# Patient Record
Sex: Male | Born: 1950
Health system: Southern US, Community
[De-identification: ages and names within clinical notes are randomized; demographics above are authoritative.]

## PROBLEM LIST (undated history)

## (undated) DIAGNOSIS — I639 Cerebral infarction, unspecified: Secondary | ICD-10-CM

## (undated) DIAGNOSIS — F419 Anxiety disorder, unspecified: Secondary | ICD-10-CM

## (undated) DIAGNOSIS — T7840XA Allergy, unspecified, initial encounter: Secondary | ICD-10-CM

## (undated) DIAGNOSIS — E785 Hyperlipidemia, unspecified: Secondary | ICD-10-CM

## (undated) DIAGNOSIS — G4730 Sleep apnea, unspecified: Secondary | ICD-10-CM

## (undated) DIAGNOSIS — K5730 Diverticulosis of large intestine without perforation or abscess without bleeding: Secondary | ICD-10-CM

## (undated) DIAGNOSIS — G4733 Obstructive sleep apnea (adult) (pediatric): Secondary | ICD-10-CM

## (undated) DIAGNOSIS — N200 Calculus of kidney: Secondary | ICD-10-CM

## (undated) DIAGNOSIS — J309 Allergic rhinitis, unspecified: Secondary | ICD-10-CM

## (undated) DIAGNOSIS — I10 Essential (primary) hypertension: Secondary | ICD-10-CM

## (undated) DIAGNOSIS — G459 Transient cerebral ischemic attack, unspecified: Secondary | ICD-10-CM

## (undated) DIAGNOSIS — E119 Type 2 diabetes mellitus without complications: Secondary | ICD-10-CM

## (undated) HISTORY — PX: COLONOSCOPY: SHX174

## (undated) HISTORY — DX: Type 2 diabetes mellitus without complications: E11.9

## (undated) HISTORY — PX: COSMETIC SURGERY: SHX468

## (undated) HISTORY — PX: TONSILLECTOMY: SUR1361

## (undated) HISTORY — DX: Anxiety disorder, unspecified: F41.9

## (undated) HISTORY — DX: Cerebral infarction, unspecified: I63.9

## (undated) HISTORY — DX: Obstructive sleep apnea (adult) (pediatric): G47.33

## (undated) HISTORY — DX: Essential (primary) hypertension: I10

## (undated) HISTORY — DX: Diverticulosis of large intestine without perforation or abscess without bleeding: K57.30

## (undated) HISTORY — DX: Calculus of kidney: N20.0

## (undated) HISTORY — DX: Sleep apnea, unspecified: G47.30

## (undated) HISTORY — DX: Hyperlipidemia, unspecified: E78.5

## (undated) HISTORY — DX: Transient cerebral ischemic attack, unspecified: G45.9

## (undated) HISTORY — DX: Allergy, unspecified, initial encounter: T78.40XA

## (undated) HISTORY — DX: Allergic rhinitis, unspecified: J30.9

---
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
## 1998-04-13 ENCOUNTER — Observation Stay (HOSPITAL_COMMUNITY): Admission: EM | Admit: 1998-04-13 | Discharge: 1998-04-14 | Payer: Self-pay | Admitting: Cardiology

## 1998-04-22 ENCOUNTER — Emergency Department (HOSPITAL_COMMUNITY): Admission: EM | Admit: 1998-04-22 | Discharge: 1998-04-22 | Payer: Self-pay | Admitting: Emergency Medicine

## 1998-06-30 ENCOUNTER — Encounter: Payer: Self-pay | Admitting: Pulmonary Disease

## 1998-06-30 ENCOUNTER — Ambulatory Visit: Admission: RE | Admit: 1998-06-30 | Discharge: 1998-06-30 | Payer: Self-pay | Admitting: Internal Medicine

## 1998-08-28 ENCOUNTER — Encounter: Payer: Self-pay | Admitting: Pulmonary Disease

## 2004-09-15 ENCOUNTER — Emergency Department (HOSPITAL_COMMUNITY): Admission: EM | Admit: 2004-09-15 | Discharge: 2004-09-15 | Payer: Self-pay | Admitting: Emergency Medicine

## 2006-06-11 ENCOUNTER — Ambulatory Visit: Payer: Self-pay | Admitting: Internal Medicine

## 2006-07-17 ENCOUNTER — Ambulatory Visit: Payer: Self-pay | Admitting: Gastroenterology

## 2006-07-31 ENCOUNTER — Ambulatory Visit: Payer: Self-pay | Admitting: Gastroenterology

## 2006-07-31 ENCOUNTER — Encounter: Payer: Self-pay | Admitting: Gastroenterology

## 2007-07-23 ENCOUNTER — Emergency Department (HOSPITAL_COMMUNITY): Admission: EM | Admit: 2007-07-23 | Discharge: 2007-07-23 | Payer: Self-pay | Admitting: Emergency Medicine

## 2008-02-28 ENCOUNTER — Encounter: Payer: Self-pay | Admitting: Endocrinology

## 2008-06-22 ENCOUNTER — Telehealth (INDEPENDENT_AMBULATORY_CARE_PROVIDER_SITE_OTHER): Payer: Self-pay | Admitting: *Deleted

## 2008-06-26 ENCOUNTER — Ambulatory Visit: Payer: Self-pay | Admitting: Internal Medicine

## 2008-06-26 DIAGNOSIS — E119 Type 2 diabetes mellitus without complications: Secondary | ICD-10-CM

## 2008-06-26 DIAGNOSIS — G4733 Obstructive sleep apnea (adult) (pediatric): Secondary | ICD-10-CM

## 2008-06-26 HISTORY — DX: Type 2 diabetes mellitus without complications: E11.9

## 2008-06-26 HISTORY — DX: Obstructive sleep apnea (adult) (pediatric): G47.33

## 2008-06-27 DIAGNOSIS — I10 Essential (primary) hypertension: Secondary | ICD-10-CM | POA: Insufficient documentation

## 2008-06-27 DIAGNOSIS — E785 Hyperlipidemia, unspecified: Secondary | ICD-10-CM

## 2008-06-27 HISTORY — DX: Essential (primary) hypertension: I10

## 2008-06-27 HISTORY — DX: Hyperlipidemia, unspecified: E78.5

## 2008-06-27 LAB — CONVERTED CEMR LAB
AST: 26 units/L (ref 0–37)
Basophils Absolute: 0.1 10*3/uL (ref 0.0–0.1)
Basophils Relative: 1.1 % — ABNORMAL HIGH (ref 0.0–1.0)
Bilirubin Urine: NEGATIVE
Chloride: 106 meq/L (ref 96–112)
Cholesterol: 115 mg/dL (ref 0–200)
Creatinine, Ser: 1 mg/dL (ref 0.4–1.5)
Eosinophils Absolute: 0.2 10*3/uL (ref 0.0–0.7)
GFR calc non Af Amer: 83 mL/min
Hemoglobin, Urine: NEGATIVE
Hgb A1c MFr Bld: 6.2 % — ABNORMAL HIGH (ref 4.6–6.0)
LDL Cholesterol: 52 mg/dL (ref 0–99)
MCHC: 34 g/dL (ref 30.0–36.0)
MCV: 87.1 fL (ref 78.0–100.0)
Neutrophils Relative %: 58.8 % (ref 43.0–77.0)
PSA: 0.76 ng/mL (ref 0.10–4.00)
Platelets: 214 10*3/uL (ref 150–400)
Total Bilirubin: 0.8 mg/dL (ref 0.3–1.2)
Triglycerides: 141 mg/dL (ref 0–149)
Urine Glucose: NEGATIVE mg/dL
Urobilinogen, UA: 0.2 (ref 0.0–1.0)
VLDL: 28 mg/dL (ref 0–40)

## 2008-07-05 DIAGNOSIS — K5730 Diverticulosis of large intestine without perforation or abscess without bleeding: Secondary | ICD-10-CM

## 2008-07-05 HISTORY — DX: Diverticulosis of large intestine without perforation or abscess without bleeding: K57.30

## 2008-11-23 ENCOUNTER — Emergency Department (HOSPITAL_COMMUNITY): Admission: EM | Admit: 2008-11-23 | Discharge: 2008-11-23 | Payer: Self-pay | Admitting: Emergency Medicine

## 2009-07-27 ENCOUNTER — Telehealth: Payer: Self-pay | Admitting: Internal Medicine

## 2009-07-30 ENCOUNTER — Ambulatory Visit: Payer: Self-pay | Admitting: Internal Medicine

## 2009-07-30 LAB — CONVERTED CEMR LAB
Albumin: 4.1 g/dL (ref 3.5–5.2)
Basophils Absolute: 0.1 10*3/uL (ref 0.0–0.1)
CO2: 28 meq/L (ref 19–32)
Calcium: 8.9 mg/dL (ref 8.4–10.5)
Cholesterol: 117 mg/dL (ref 0–200)
GFR calc non Af Amer: 82.16 mL/min (ref >60)
HCT: 45.1 % (ref 39.0–52.0)
HDL: 40 mg/dL (ref >39.00)
Hemoglobin, Urine: NEGATIVE
Hgb A1c MFr Bld: 6 % (ref 4.6–6.5)
Ketones, ur: NEGATIVE mg/dL
Leukocytes, UA: NEGATIVE
Lymphs Abs: 1.7 10*3/uL (ref 0.7–4.0)
MCHC: 33.9 g/dL (ref 30.0–36.0)
MCV: 87.1 fL (ref 78.0–100.0)
Monocytes Absolute: 0.6 10*3/uL (ref 0.1–1.0)
PSA: 0.75 ng/mL (ref 0.10–4.00)
Platelets: 176 10*3/uL (ref 150.0–400.0)
RDW: 11.8 % (ref 11.5–14.6)
Sodium: 142 meq/L (ref 135–145)
Specific Gravity, Urine: 1.025 (ref 1.000–1.030)
TSH: 1.4 microintl units/mL (ref 0.35–5.50)
Total Bilirubin: 0.8 mg/dL (ref 0.3–1.2)
Triglycerides: 37 mg/dL (ref 0.0–149.0)
Urobilinogen, UA: 0.2 (ref 0.0–1.0)

## 2010-05-27 IMAGING — CT CT PELVIS W/O CM
2 of 6 series · 14 of 46 positions shown, 18 images · non-contrast
Comparison: None.

CT ABDOMEN

CLINICAL DATA: Right-sided abdominal pain.  Nausea and vomiting.

CT ABDOMEN AND PELVIS WITHOUT CONTRAST 11/23/2008:
TECHNIQUE: Multidetector CT imaging of the abdomen and pelvis was
performed following the standard protocol without intravenous
contrast.

[Series 5: routine abdomen · axial · 0.98mm/px · z∈[-565,-270]mm · 11 of 66 slices shown, 15 images]
[im 4/66  soft-tissue]
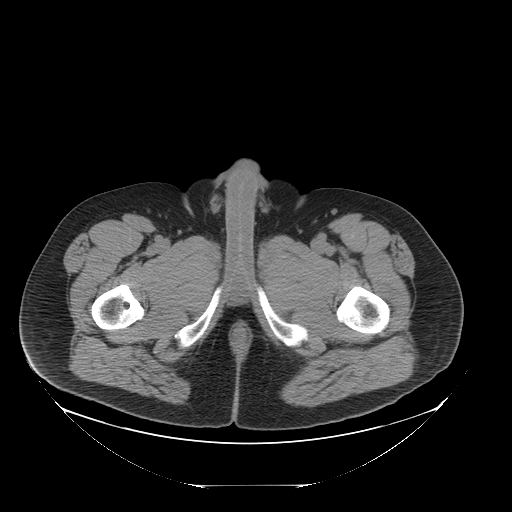
[im 4/66  bone]
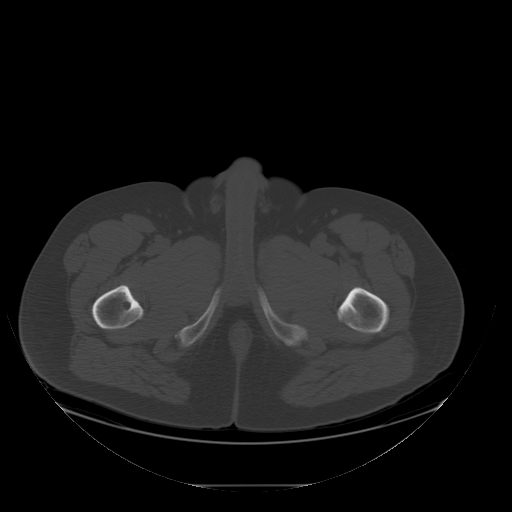
[im 12/66  soft-tissue]
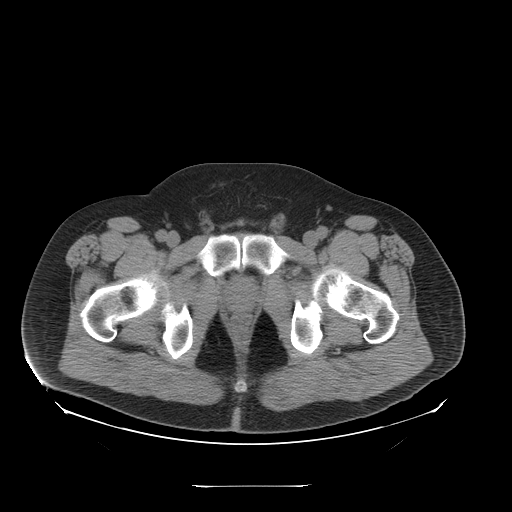
[im 20/66  soft-tissue]
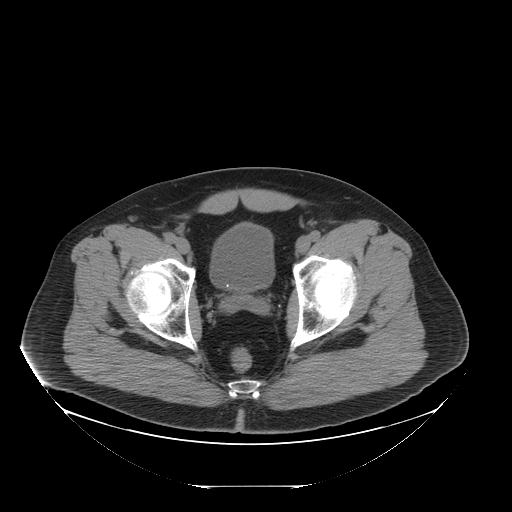
[im 27/66  soft-tissue]
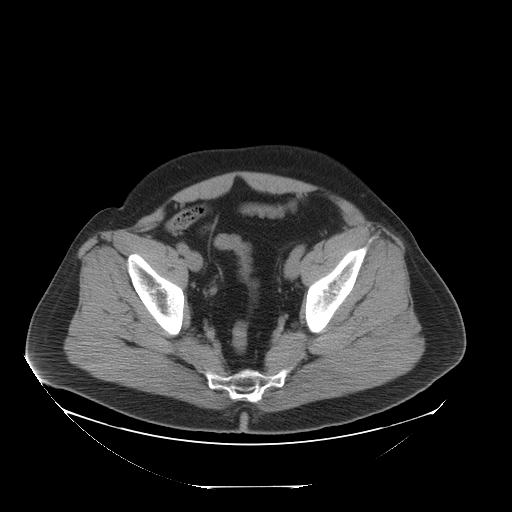
[im 35/66  soft-tissue]
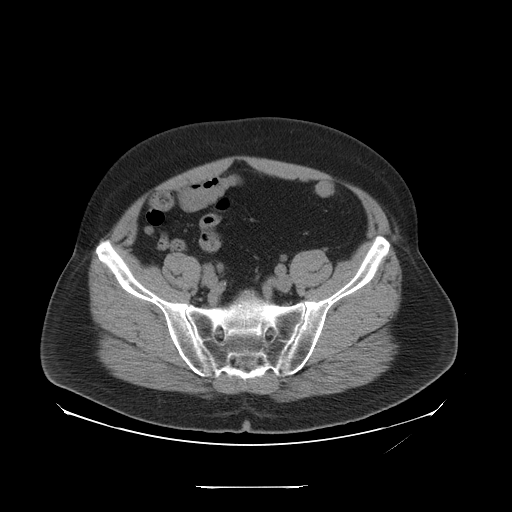
[im 39/66  soft-tissue]
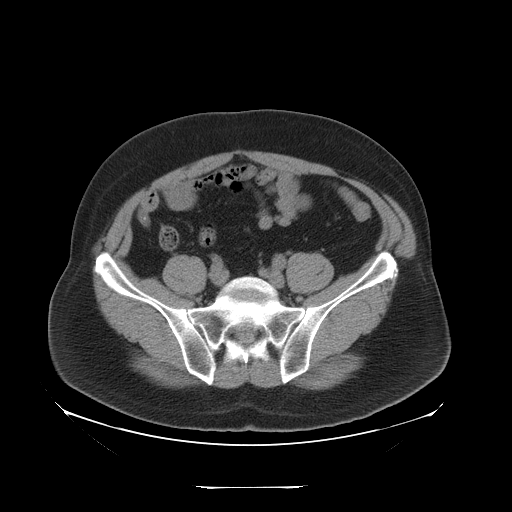
[im 46/66  soft-tissue]
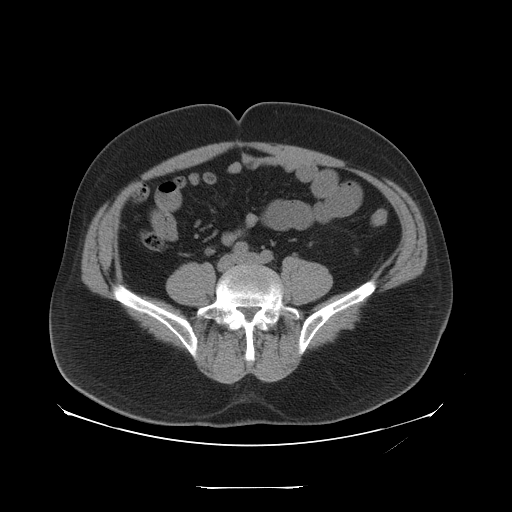
[im 50/66  lung]
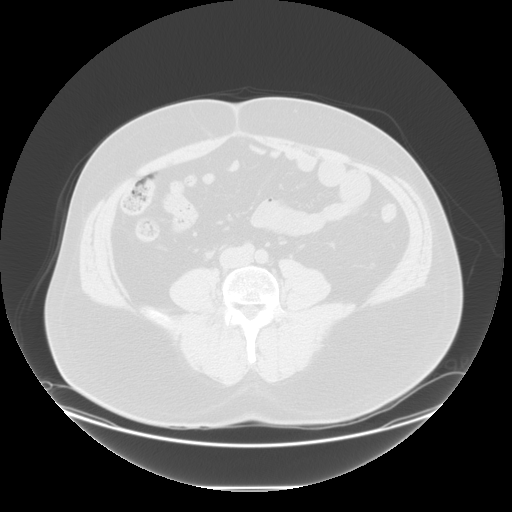
[im 54/66  soft-tissue]
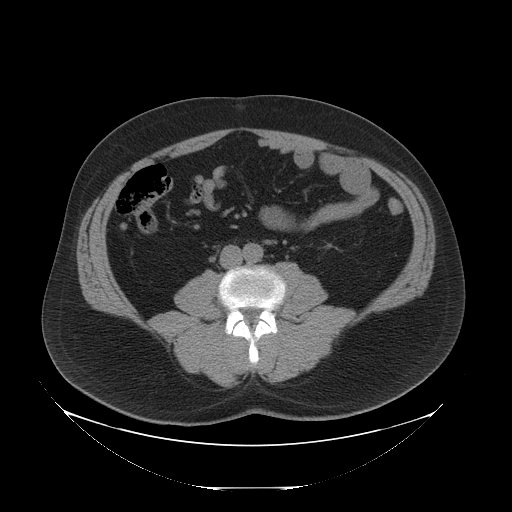
[im 54/66  lung]
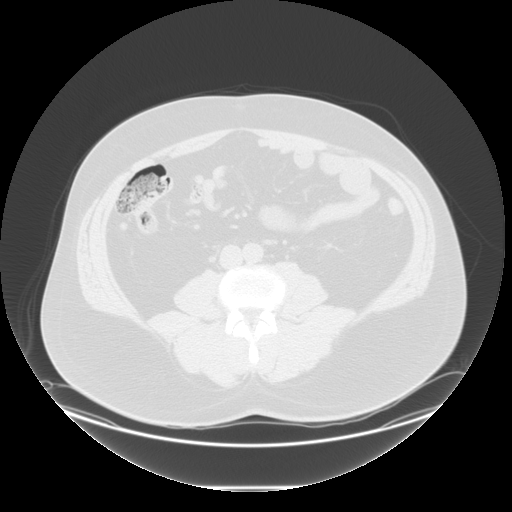
[im 58/66  lung]
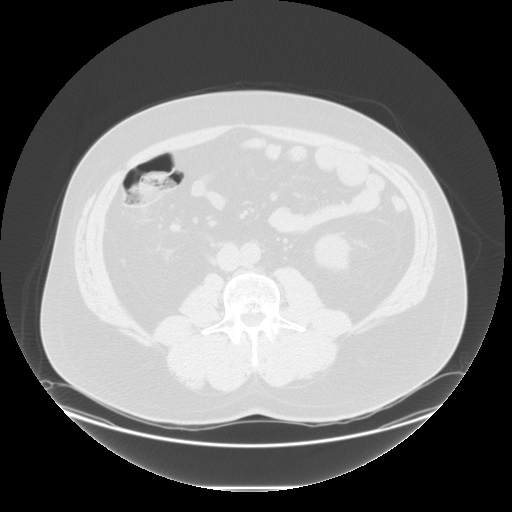
[im 62/66  soft-tissue]
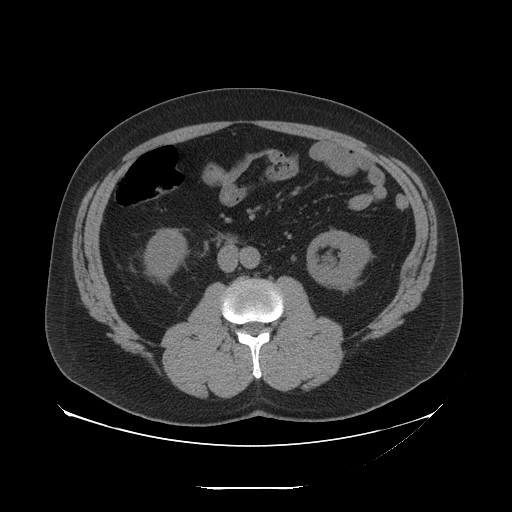
[im 62/66  lung]
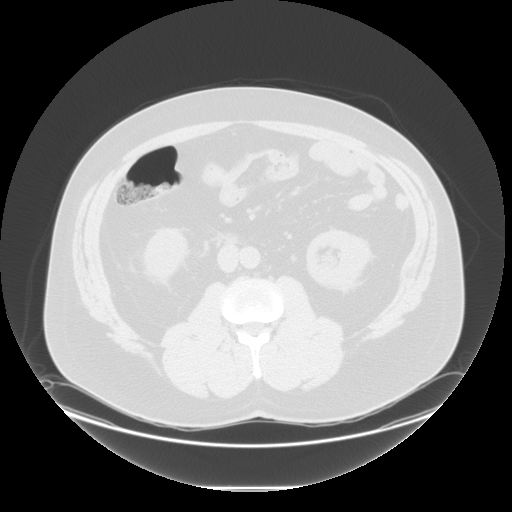
[im 62/66  bone]
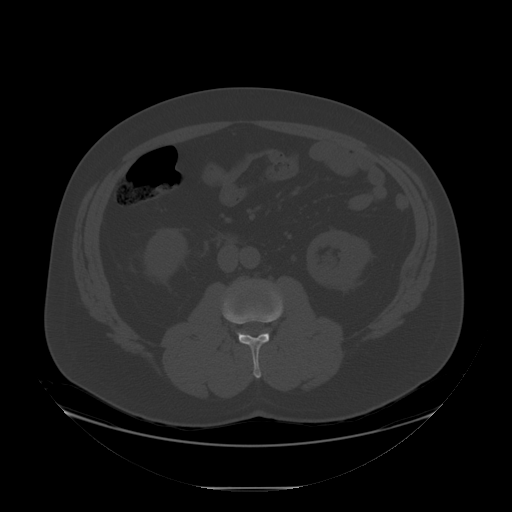

[Series 401: reformatted · coronal · 0.90mm/px · 3 of 119 slices shown]
[im 30/119  soft-tissue]
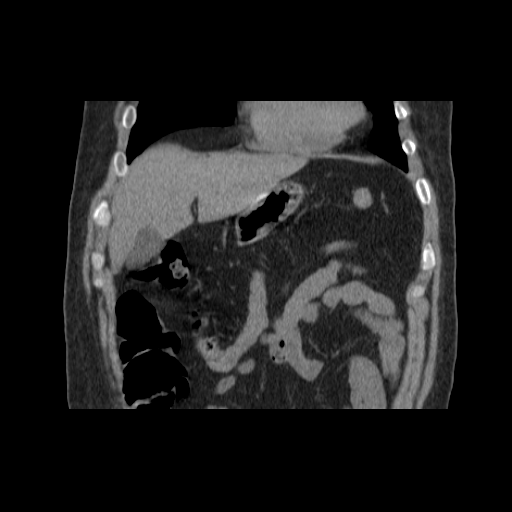
[im 60/119  soft-tissue]
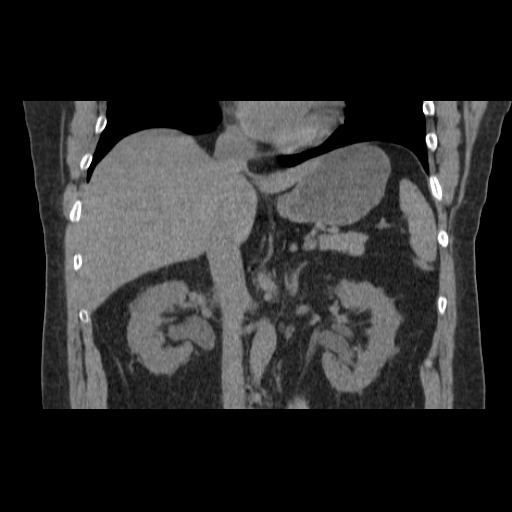
[im 89/119  soft-tissue]
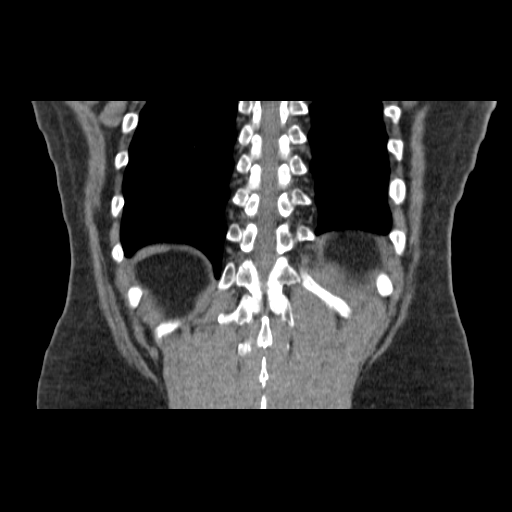

[14 of 46 positions shown; findings below may reference images not displayed]

FINDINGS: Moderate right hydronephrosis, right renal edema, and
right perinephric edema.  No proximal right ureteral calculus.
Small (approximate 2 mm) calculus in a mid calix of the right
kidney.  No left upper urinary tract calculi.  Bilateral extrarenal
pelves.  Within the limits of the unenhanced technique, small cyst
in the anterior mid left kidney, but no visible solid renal
lesions.

Normal unenhanced appearance of the liver, spleen, pancreas, and
adrenal glands.  Gallbladder unremarkable by CT.  No biliary ductal
dilation.  Stomach and visualized small bowel and colon
unremarkable in the abdomen.  Abdominal aorta normal in appearance.
No significant lymphadenopathy.  No free fluid.  Visualized lung
bases clear.  Bone window images demonstrate mild lower thoracic
spondylosis.
IMPRESSION: 1.  Right upper urinary tract obstruction.  No proximal right
ureteral calculus to account for such.
2.  Small (2 mm) calculus in a mid calix of the right kidney.  No
left upper urinary tract calculi.
3.  Otherwise normal unenhanced CT abdomen.

CT PELVIS
FINDINGS: Obstructing small (2 mm) calculus either in the
intramural portion of the right ureter at the UVJ or recently
passed into the bladder. Moderate prostate gland enlargement for
age.  Several pelvic phleboliths.  Feces in the distal ileum,
suggesting stasis.  Visualized small bowel otherwise unremarkable.
Mildly dilated appendix up to 9 mm diameter in the right mid
abdomen and upper pelvis, with a tiny appendicolith in its
midportion; no evidence of acute appendicitis.  No significant
lymphadenopathy.  No free fluid.  Bone window images unremarkable.
IMPRESSION: 1.  Obstructing approximate 2 mm calculus which is either in the
intramural portion of the right ureter at the UVJ or has recently
passed into the bladder.

2.  Moderate prostate gland enlargement for age.
3.  Feces in the distal ileum suggesting stasis/motility disorder.
Clinical correlation.
4.  Mildly dilated appendix containing a tiny appendicolith; no
evidence for acute appendicitis.

## 2010-07-26 ENCOUNTER — Ambulatory Visit: Payer: Self-pay | Admitting: Internal Medicine

## 2010-07-26 ENCOUNTER — Telehealth: Payer: Self-pay | Admitting: Internal Medicine

## 2010-07-27 LAB — CONVERTED CEMR LAB
ALT: 34 units/L (ref 0–53)
AST: 31 units/L (ref 0–37)
Alkaline Phosphatase: 71 units/L (ref 39–117)
BUN: 17 mg/dL (ref 6–23)
Basophils Absolute: 0.1 10*3/uL (ref 0.0–0.1)
Bilirubin Urine: NEGATIVE
Bilirubin, Direct: 0.2 mg/dL (ref 0.0–0.3)
Calcium: 9.2 mg/dL (ref 8.4–10.5)
Eosinophils Relative: 6.9 % — ABNORMAL HIGH (ref 0.0–5.0)
GFR calc non Af Amer: 96.14 mL/min (ref >60)
HCT: 43.8 % (ref 39.0–52.0)
Hemoglobin, Urine: NEGATIVE
Ketones, ur: NEGATIVE mg/dL
LDL Cholesterol: 68 mg/dL (ref 0–99)
Leukocytes, UA: NEGATIVE
Lymphocytes Relative: 30.1 % (ref 12.0–46.0)
Lymphs Abs: 2.4 10*3/uL (ref 0.7–4.0)
Monocytes Relative: 9.1 % (ref 3.0–12.0)
Neutrophils Relative %: 53.1 % (ref 43.0–77.0)
Nitrite: NEGATIVE
PSA: 0.78 ng/mL (ref 0.10–4.00)
Platelets: 190 10*3/uL (ref 150.0–400.0)
Potassium: 4.3 meq/L (ref 3.5–5.1)
RDW: 13 % (ref 11.5–14.6)
Sodium: 141 meq/L (ref 135–145)
TSH: 1.64 microintl units/mL (ref 0.35–5.50)
Total Bilirubin: 0.6 mg/dL (ref 0.3–1.2)
Total CHOL/HDL Ratio: 3
VLDL: 15.8 mg/dL (ref 0.0–40.0)
WBC: 8.1 10*3/uL (ref 4.5–10.5)
pH: 6.5 (ref 5.0–8.0)

## 2010-08-14 ENCOUNTER — Ambulatory Visit: Payer: Self-pay | Admitting: Pulmonary Disease

## 2010-10-21 ENCOUNTER — Telehealth (INDEPENDENT_AMBULATORY_CARE_PROVIDER_SITE_OTHER): Payer: Self-pay | Admitting: *Deleted

## 2010-11-14 ENCOUNTER — Encounter: Payer: Self-pay | Admitting: Pulmonary Disease

## 2010-11-21 ENCOUNTER — Telehealth (INDEPENDENT_AMBULATORY_CARE_PROVIDER_SITE_OTHER): Payer: Self-pay | Admitting: *Deleted

## 2010-12-20 ENCOUNTER — Ambulatory Visit: Payer: Self-pay | Admitting: Pulmonary Disease

## 2011-01-21 NOTE — Progress Notes (Signed)
Summary: pt needs ov with kc to discuss sleep  Phone Note Outgoing Call   Call placed by: Carver Fila,  October 21, 2010 5:50 PM Call placed to: Patient Summary of Call: called and spoke with pt to try to set up ov and discuss sleep and pt stated due to working out of town he is not able  to schedule and ov at this time. pt states he will give our office a call back when he gets back into town to schedule an ov when he can Initial call taken by: Carver Fila,  October 21, 2010 5:50 PM

## 2011-01-21 NOTE — Letter (Signed)
Summary: CMN for PAP  Supplies/[facility]  CMN for PAP  Supplies/[facility]   Imported By: Sherian Rein 11/22/2010 08:31:25  _____________________________________________________________________  External Attachment:    Type:   Image     Comment:   External Document

## 2011-01-21 NOTE — Progress Notes (Signed)
  Phone Note Refill Request Message from:  Patient on July 26, 2010 10:35 AM  Refills Requested: Medication #1:  CADUET 10-10 MG TABS Take 1 tablet by mouth once a day   Dosage confirmed as above?Dosage Confirmed   Notes: [facility]  Medication #2:  LISINOPRIL 20 MG TABS Take 1 tablet by mouth once a day   Dosage confirmed as above?Dosage Confirmed   Notes: [facility] Initial call taken by: Robin Ewing CMA (AAMA),  July 26, 2010 10:36 AM    Prescriptions: LISINOPRIL 20 MG TABS (LISINOPRIL) Take 1 tablet by mouth once a day  #30 x 0   Entered by:   Scharlene Gloss CMA (AAMA)   Authorized by:   Corwin Levins MD   Signed by:   Scharlene Gloss CMA (AAMA) on 07/26/2010   Method used:   Electronically to        [facility] [phone]* (retail)       [address] [place], [place]  [address]       Ph: [phone]       Fax: [phone]   RxID:   [id number] CADUET 10-10 MG TABS (AMLODIPINE-ATORVASTATIN) Take 1 tablet by mouth once a day  #30 x 0   Entered by:   Scharlene Gloss CMA (AAMA)   Authorized by:   Corwin Levins MD   Signed by:   Scharlene Gloss CMA (AAMA) on 07/26/2010   Method used:   Electronically to        [facility] [phone]* (retail)       [address] [place], [place]  [address]       Ph: [phone]       Fax: [phone]   RxID:   [id number]

## 2011-01-21 NOTE — Assessment & Plan Note (Signed)
Summary: FU/NWS   Vital Signs:  Patient profile:   60 year old male Height:      75.5 inches Weight:      281 pounds BMI:     34.78 O2 Sat:      97 % on Room air Temp:     96.7 degrees F oral Pulse rate:   69 / minute BP sitting:   120 / 72  (left arm) Cuff size:   large  Vitals Entered By: Zella Ball Ewing CMA (AAMA) (July 26, 2010 2:27 PM)  O2 Flow:  Room air  CC: Followup/RE   CC:  Followup/RE.  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  Pt denies polydipsia, polyuria, or low sugar symptoms such as shakiness improved with eating.  Overall good compliance with meds, trying to follow low chol, DM diet, wt increased despite trying to walk.  Problems Prior to Update: 1)  Preventive Health Care  (ICD-V70.0) 2)  Diverticulosis, Colon  (ICD-562.10) 3)  Obstructive Sleep Apnea  (ICD-327.23) 4)  Diabetes Mellitus, Type II  (ICD-250.00) 5)  Dm  (ICD-250.00) 6)  Hypertension  (ICD-401.9) 7)  Hyperlipidemia  (ICD-272.4) 8)  Preventive Health Care  (ICD-V70.0)  Medications Prior to Update: 1)  Caduet 10-10 Mg Tabs (Amlodipine-Atorvastatin) .... Take 1 Tablet By Mouth Once A Day 2)  Lisinopril 20 Mg Tabs (Lisinopril) .... Take 1 Tablet By Mouth Once A Day 3)  Aspirin 325 Mg Tabs (Aspirin) .[name].. 1 By Mouth Once Daily 4)  Fish Oil 500 Mg Caps (Omega-3 Fatty Acids) .[name].. 1 By Mouth Two Times A Day 5)  Healthy Heart  Tabs (Specialty Vitamins Products) .[name].. 1 By Mouth Once Daily 6)  Co Q-10 200 Mg Caps (Coenzyme Q10) .[name].. 1 By Mouth Once Daily 7)  Vitamin C .... 1 By Mouth Once  Daily  Current Medications (verified): 1)  Caduet 10-10 Mg Tabs (Amlodipine-Atorvastatin) .... Take 1 Tablet By Mouth Once A Day 2)  Lisinopril 20 Mg Tabs (Lisinopril) .... Take 1 Tablet By Mouth Once A Day 3)  Aspirin 325 Mg Tabs (Aspirin) .[name].. 1 By Mouth Once Daily 4)  Fish Oil 500 Mg Caps (Omega-3 Fatty Acids) .[name].. 1 By Mouth Two Times A Day 5)  Healthy Heart  Tabs (Specialty Vitamins Products) .[name].. 1 By Mouth Once Daily 6)  Co Q-10 200 Mg Caps (Coenzyme Q10) .[name].. 1 By Mouth Once Daily 7)  Vitamin C .... 1 By Mouth Once Daily  Allergies (verified): 1)  ! 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         all otherwise negative per pt -  except for daytime somnolence worsening - last eval for OSA over 5 yrs ago - requests repeat evaluation  Physical Exam  General:  alert and overweight-appearing.   Head:  normocephalic and atraumatic.   Eyes:  vision grossly intact, pupils equal, and pupils round.    Ears:  R ear normal and L ear normal.   Nose:  no external deformity and no nasal discharge.   Mouth:  no gingival abnormalities and pharynx pink and moist.   Neck:  supple and no masses.   Lungs:  normal respiratory effort and normal breath sounds.   Heart:  normal rate and regular rhythm.   Abdomen:  soft, non-tender, and normal bowel sounds.   Msk:  no joint tenderness and no joint swelling.   Extremities:  no edema, no erythema  Neurologic:  cranial nerves II-XII intact and strength normal in all extremities.   Skin:  color normal and no rashes.   Psych:  memory intact for recent and remote and not depressed appearing.     Impression & Recommendations:  Problem # 1:  Preventive Health Care (ICD-V70.0)  Overall doing well, age appropriate education and counseling updated and referral for appropriate preventive services done unless declined, immunizations up to date or declined, diet counseling done if overweight, urged to quit smoking if smokes , most recent labs reviewed and current ordered if appropriate, ecg reviewed or declined (interpretation per ECG scanned in the EMR if done); information regarding Medicare Prevention requirements given if appropriate; speciality referrals updated as appropriate   Orders: TLB-BMP (Basic Metabolic Panel-BMET) (80048-METABOL) TLB-CBC Platelet - w/Differential (85025-CBCD) TLB-Hepatic/Liver Function Pnl (80076-HEPATIC) TLB-Lipid Panel (80061-LIPID) TLB-TSH (Thyroid Stimulating Hormone) (84443-TSH) TLB-PSA (Prostate Specific Antigen) (84153-PSA) TLB-Udip ONLY (81003-UDIP)  Problem # 2:  OBSTRUCTIVE SLEEP APNEA (ICD-327.23)  for pulm referral  Orders: Pulmonary Referral (Pulmonary)  Problem # 3:  DIABETES MELLITUS, TYPE II (ICD-250.00)  His updated medication list for this problem includes:    Lisinopril 20 Mg Tabs (Lisinopril) .[name]... Take 1 tablet by mouth once a day    Aspirin 325 Mg Tabs (Aspirin) .[name]... 1 by mouth once daily stable  overall by hx and exam, ok to continue meds/tx as is - Pt to cont DM diet, excercise, wt loss efforts; to check labs today   Problem # 4:  HYPERTENSION (ICD-401.9)  His updated medication list for this problem includes:    Caduet 10-10 Mg Tabs (Amlodipine-atorvastatin) .[name]... Take 1 tablet by mouth once a day    Lisinopril 20 Mg Tabs (Lisinopril) .[name]... Take 1 tablet by mouth once a day  BP today: 120/72 Prior BP: 138/96 (07/30/2009)  Labs Reviewed: K+: 4.3 (07/30/2009) Creat: : 1.0 (07/30/2009)   Chol: 117 (07/30/2009)   HDL: 40.00 (07/30/2009)   LDL: 70 (07/30/2009)   TG: 37.0 (07/30/2009) stable overall by hx and exam, ok to continue meds/tx as is   Complete Medication List: 1)  Caduet 10-10 Mg Tabs (Amlodipine-atorvastatin) .... Take 1 tablet by mouth once a day 2)  Lisinopril 20 Mg Tabs (Lisinopril) .... Take 1 tablet by mouth once a day 3)  Aspirin 325 Mg Tabs (Aspirin) .[name].. 1 by mouth once daily 4)  Fish Oil 500 Mg Caps (Omega-3 fatty acids) .[name].. 1 by mouth two times a day 5)  Healthy Heart Tabs (Specialty vitamins products) .[name].. 1 by mouth once daily 6)  Co Q-10 200 Mg Caps (Coenzyme q10) .[name].. 1 by mouth once daily 7)  Vitamin C  .... 1 by mouth once daily  Patient Instructions: 1)  you had the tetanus shot today 2)  Continue all previous medications as before this visit- [facility] - [facility] supply 3)  Please go to the Lab in the basement for your blood and/or urine tests today 4)  Please call the number on the [facility] for results of your testing 5)  You will be contacted about the referral(s) to: Pulmonary for the sleep apnea 6)  Please schedule a follow-up appointment in 1 year or sooner if needed Prescriptions: LISINOPRIL 20 MG TABS (LISINOPRIL) Take 1 tablet by mouth once a day  #90 x 3   Entered and Authorized by:   Corwin Levins MD   Signed by:   Corwin Levins MD on 07/26/2010   Method used:   Electronically to        [facility]  [phone]* (retail)       [address] [place], [place]  [address]       Ph: [phone]       Fax: [phone]   RxID:   [id number] CADUET 10-10 MG TABS (AMLODIPINE-ATORVASTATIN) Take 1 tablet by mouth once a day  #90 x 3   Entered and Authorized by:   Corwin Levins MD   Signed by:   Corwin Levins MD on 07/26/2010   Method used:   Electronically to        [facility] [phone]* (retail)       [address] [place], [place]  [address]       Ph: [phone]       Fax: [phone]   RxID:   [id number]   Appended Document: Immunization Entry      Immunizations Administered:  Tetanus Vaccine:    Vaccine Type: Tdap    Site: left deltoid    Mfr: GlaxoSmithKline    Dose: 0.5 ml    Route: IM    Given by: Zella Ball Ewing CMA (AAMA)    Exp. Date: 06/20/2012    Lot #: [id number]    VIS given: 11/09/07 version given July 26, 2010.

## 2011-01-21 NOTE — Progress Notes (Signed)
Summary: ? returning call - LMTCB x 4  Phone Note Call from Patient   Caller: Patient Call For: clance Reason for Call: Talk to Nurse Summary of Call: Patient called saying he was returning Mindy's call.  I told him it looks like he needs to make an appt. w/ [facility] for sleep results.  He said he works out of town all the time.  He stated he has tried to get Mindy to return his call several times and is requesting her "service".  He was a bit upset. Initial call taken by: Lehman Prom,  November 21, 2010 3:28 PM  Follow-up for Phone Call        Called number listed as pt's home #, [facility] Crystal Jones RN  November 21, 2010 3:41 PM   Pt just needs 5 week f/u to discuss how he is doing on his cpap. lmomtcb x1. I have not received any phone notes on pt trying to contact me. Per Dr. Shelle Iron pt needs follow up bc it is required by the insurance company for them to have a 5 week follow up after being set up on cpap. As well as pt has not had his pressure set on his machine yet and in order to do that pt needs to come in and see Dr. Jamesetta Orleans  November 21, 2010 3:46 PM   Additional Follow-up for Phone Call Additional follow up Details #1::        pt has made a f/u w/ kc for 12/30 but wants to speak to nurse in the meantime as pt now works out of town and it is very difficult to come in for ov. cell [phone]. Tivis Ringer, CNA  November 22, 2010 11:57 AM  called pt's cell # - [facility] Gweneth Dimitri RN  November 22, 2010 2:03 PM     Additional Follow-up for Phone Call Additional follow up Details #2::    LMOMTCB Vernie Murders  November 25, 2010 11:30 AM LMOMTCB [facility]  November 26, 2010 4:56 PM  LMOMTCB Vernie Murders  November 29, 2010 8:38 AM-will sign off on msg per protocol

## 2011-01-21 NOTE — Consult Note (Signed)
Summary: [facility], [facility]   Imported By: Sherian Rein 08/15/2010 07:07:37  _____________________________________________________________________  External Attachment:    Type:   Image     Comment:   External Document

## 2011-01-21 NOTE — Assessment & Plan Note (Signed)
Summary: consult for management of osa   Vital Signs:  Patient profile:   60 year old male Height:      75.5 inches Weight:      280 pounds BMI:     34.66 O2 Sat:      96 % on Room air Temp:     97.7 degrees F oral Pulse rate:   72 / minute BP sitting:   130 / 80  (left arm) Cuff size:   large  Vitals Entered By: Arman Filter LPN (August 14, 2010 11:46 AM)  O2 Flow:  Room air CC: Sleep Consult Comments Medications reviewed with patient Arman Filter LPN  August 14, 2010 11:47 AM    Copy to:  [name] Primary Provider/Referring Provider:  Corwin Levins MD  CC:  Sleep Consult.  History of Present Illness: The pt is a 59y/o male who I have been asked to see for management of osa.  He was diagnosed with moderate osa in 1999, with AHI 20/hr ad desat to 90%.  He was started on cpap, but had issues with "smothering" and "didn't feel comfortable with it".  He stopped using cpap, and never followed up with me.  He comes in today where he is having worsening symptoms, and has been noted to have loud snoring and pauses.  He typically goes to bed at 11-58mn, and arises at 7am to start his day.  He has frequent awakenings during the night, and feels unrested in the am's upon arising.  He describes some sleep pressure during the day with periods of inactivity, and will doze watching tv.  He can get sleepy driving, but typically at longer distances.  His epworth score today is abnormal at 15, and his weight is unchanged over the last 2 years per his history.  Medications Prior to Update: 1)  Caduet 10-10 Mg Tabs (Amlodipine-Atorvastatin) .... Take 1 Tablet By Mouth Once A Day 2)  Lisinopril 20 Mg Tabs (Lisinopril) .... Take 1 Tablet By Mouth Once A Day 3)  Aspirin 325 Mg Tabs (Aspirin) .[name].. 1 By Mouth Once Daily 4)  Fish Oil 500 Mg Caps (Omega-3 Fatty Acids) .[name].. 1 By Mouth Two Times A Day 5)  Healthy Heart  Tabs (Specialty Vitamins Products) .[name].. 1 By Mouth Once Daily 6)  Co Q-10 200 Mg  Caps (Coenzyme Q10) .[name].. 1 By Mouth Once Daily 7)  Vitamin C .... 1 By Mouth Once Daily  Allergies (verified): 1)  ! Penicillin  Past History:  Past Medical History: Hyperlipidemia Hypertension Diabetes mellitus, type II - diet OSA--AHI 20/hr in 1999. Diverticulosis, colon  Past Surgical History: Reviewed history from 06/26/2008 and no changes required. Tonsillectomy s/p nasal surgury 1970  Family History: Reviewed history from 06/26/2008 and no changes required. father with MI in early 87's  Social History: Reviewed history from 07/30/2009 and no changes required. 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    Physical Exam  General:  ow male in  nad Eyes:  PERRLA and EOMI.   Nose:  very narrowed bilat with septal deviation and increased turbinates. Mouth:  long thick palate and uvula Neck:  no jvd, tmg, LN Lungs:  clear to auscultation Heart:  rrr, no mrg Abdomen:  soft and nontender, bs+ Extremities:  no significant edema or cyanosis pulses intact distally Neurologic:  alert and oriented, moves all 4.   Impression & Recommendations:  Problem # 1:  OBSTRUCTIVE SLEEP APNEA (ICD-327.23) the pt has a history of moderate osa that currently is untreated.  He clearly is having disrupted sleep, abnormal sleepiness during the  day, and is impacting his QOL.  I have reviewed again the various treatments of moderate osa, including weight loss, upper airway surgery, dental appliance, and cpap.  At this point, he wishes to try cpap again.  Will initiate this and work on troubleshooting as issues arise.  Will optimize pressure for him once he has gone thru desensitization.  Other Orders: Consultation Level IV (16109) DME Referral (DME)  Patient Instructions: 1)  will start on cpap.  Please call if having any type of tolerance issue 2)  work on weight loss 3)  followup with me in 5 weeks.  Appended Document: consult for management of osa pt needs ov with me in one not scheduled in near future.  He never came for 5 week rov after initial consult.  Appended Document: consult for management of osa lmomtcb x1  Appended Document: consult for management of osa called and spoke with pt. pt was not able to make an ov woth dr. Shelle Iron due to pt working out of town. pt states he is not sure when he will be back into town and will give [place] a call when he comes back in to set up an apt with dr. Shelle Iron to follow up on sleep.

## 2011-01-23 NOTE — Assessment & Plan Note (Signed)
Summary: rov for management of osa   Visit Type:  Follow-up Copy to:  [name] Martie Fulgham/Referring Samit Sylve:  Corwin Levins MD  CC:  follow up. pt states he uses his cpap machine everynight x 6-7 hrs a night. pt states he is having no problems with the machine. [name]  History of Present Illness: the pt comes in today for f/u of his known osa.  He was started on cpap at the last visit, but never came back for his 5 weeks f/u.  He is wearing cpap compliantly by his 3 mos download, with 98% of days greater than 4 hours of use.  However, his AHI is elevated at 11/hr since he never came back to have his pressure optimized.  He is having no issues with his cpap mask, and denies any issues with the pressure.  He has seen considerable difference in his daytime sleepiness, and no longer gets sleepy while driving.  Current Medications (verified): 1)  Caduet 10-10 Mg Tabs (Amlodipine-Atorvastatin) .... Take 1 Tablet By Mouth Once A Day 2)  Lisinopril 20 Mg Tabs (Lisinopril) .... Take 1 Tablet By Mouth Once A Day 3)  Aspirin 325 Mg Tabs (Aspirin) .[name].. 1 By Mouth Once Daily 4)  Fish Oil 500 Mg Caps (Omega-3 Fatty Acids) .[name].. 1 By Mouth Two Times A Day 5)  Healthy Heart  Tabs (Specialty Vitamins Products) .[name].. 1 By Mouth Once Daily 6)  Co Q-10 200 Mg Caps (Coenzyme Q10) .[name].. 1 By Mouth Once Daily 7)  Vitamin C .... 1 By Mouth Once Daily  Allergies (verified): 1)  ! Penicillin  Past History:  Past medical, surgical, family and social histories (including risk factors) reviewed, and no changes noted (except as noted below).  Past Medical History: Reviewed history from 08/14/2010 and no changes required. Hyperlipidemia Hypertension Diabetes mellitus, type II - diet OSA--AHI 20/hr in 1999. Diverticulosis, colon  Past Surgical History: Reviewed history from 06/26/2008 and no changes required. Tonsillectomy s/p nasal surgury 1970  Family History: Reviewed history from 06/26/2008 and no  changes required. father with MI in early 85's  Social History: Reviewed history from 08/14/2010 and no changes required. 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    Vital Signs:  Patient profile:   60 year old male Height:      75.5 inches Weight:      294 pounds BMI:     36.39 O2 Sat:      96 % on Room air Temp:     97.8 degrees F oral Pulse rate:   84 / minute BP sitting:   116 / 80  (left arm) Cuff size:   large  Vitals Entered By: Carver Fila (December 20, 2010 11:10 AM)  O2 Flow:  Room air CC: follow up. pt states he uses his cpap machine everynight x 6-7 hrs a night. pt states he is having no problems with the machine.  Comments meds and allergies updated Phone number updated Mindy  Silva  December 20, 2010 11:10 AM    Physical Exam  General:  obese male in nad Nose:  no skin breakdown or pressure necrosis from cpap mask Neck:  no jvd, tmg, LN Heart:  rrr Extremities:  no significant edema, no cyanosis  Neurologic:  alert, does not appear sleepy, moves all 4.   Impression & Recommendations:  Problem # 1:  OBSTRUCTIVE SLEEP APNEA (ICD-327.23) the pt is wearing cpap compliantly, and feels his symptoms are much improved.  He has not had his pressure optimized, and this explains his persistently elevated AHI on his recent  download.  He is having no issues with mask or pressure tolerance.  Will optimize pressure, and I have also encouraged him to work on weight loss. Care Plan:  At this point, will arrange for the patient's machine to be changed over to auto mode for 2 weeks to optimize their pressure.  I will review the downloaded data once sent by dme, and also evaluate for compliance, leaks, and residual osa.  I will call the patient and dme to discuss the results, and have the patient's machine set appropriately.  This will serve as the pt's cpap pressure titration.  Other Orders: Est. Patient Level IV (16109) DME Referral (DME)  Patient Instructions: 1)  continue with cpap.  Will change over to automatic mode in order to optimize your pressure.  Will call you with the results.  Let us know if your prefer the auto mode to the set pressure. 2)  work on weight loss 3)  followup with me in 6mos, but call if having issues.    Immunization History:  Influenza Immunization History:    Influenza:  historical (11/15/2010)

## 2011-06-16 ENCOUNTER — Other Ambulatory Visit: Payer: Self-pay | Admitting: Internal Medicine

## 2011-09-22 ENCOUNTER — Other Ambulatory Visit: Payer: Self-pay | Admitting: Internal Medicine

## 2011-09-26 LAB — POCT I-STAT, CHEM 8
Calcium, Ion: 1.12 mmol/L (ref 1.12–1.32)
Chloride: 103 mEq/L (ref 96–112)
Glucose, Bld: 142 mg/dL — ABNORMAL HIGH (ref 70–99)
HCT: 45 % (ref 39.0–52.0)
TCO2: 26 mmol/L (ref 0–100)

## 2011-09-26 LAB — URINALYSIS, ROUTINE W REFLEX MICROSCOPIC
Bilirubin Urine: NEGATIVE
Glucose, UA: NEGATIVE mg/dL
Ketones, ur: 15 mg/dL — AB
Nitrite: NEGATIVE
Protein, ur: NEGATIVE mg/dL
pH: 6 (ref 5.0–8.0)

## 2011-10-25 ENCOUNTER — Other Ambulatory Visit: Payer: Self-pay | Admitting: Internal Medicine

## 2011-11-25 ENCOUNTER — Other Ambulatory Visit: Payer: Self-pay | Admitting: Internal Medicine

## 2011-11-27 ENCOUNTER — Other Ambulatory Visit: Payer: Self-pay

## 2011-11-27 ENCOUNTER — Other Ambulatory Visit: Payer: Self-pay | Admitting: Internal Medicine

## 2011-11-27 MED ORDER — LISINOPRIL 20 MG PO TABS: 20.0000 mg | ORAL_TABLET | Freq: Every day | ORAL | Status: DC

## 2011-11-27 MED ORDER — AMLODIPINE-ATORVASTATIN 10-10 MG PO TABS: 1.0000 | ORAL_TABLET | Freq: Every day | ORAL | Status: DC

## 2011-11-28 ENCOUNTER — Telehealth: Payer: Self-pay

## 2011-11-28 MED ORDER — LISINOPRIL 20 MG PO TABS: 20.0000 mg | ORAL_TABLET | Freq: Every day | ORAL | Status: DC

## 2011-11-28 MED ORDER — AMLODIPINE BESYLATE 10 MG PO TABS: 10.0000 mg | ORAL_TABLET | Freq: Every day | ORAL | Status: DC

## 2011-11-28 MED ORDER — ATORVASTATIN CALCIUM 10 MG PO TABS: 10.0000 mg | ORAL_TABLET | Freq: Every day | ORAL | Status: DC

## 2011-11-28 NOTE — Telephone Encounter (Signed)
Faxed to [facility] in [place] as requested per patient [phone], called informed the patient.

## 2011-11-28 NOTE — Telephone Encounter (Signed)
Done hardcopy to robin  

## 2011-11-28 NOTE — Telephone Encounter (Signed)
Sent the patients BP med. To pharmacy yesterday 11/27/11. The patient pickup up but has lost and is out of town. Would like them called in to [facility] [address], [place], [place]. ALSO he is requesting to separate the caduet to make less expensive. Please advise.

## 2011-11-29 ENCOUNTER — Encounter: Payer: Self-pay | Admitting: Internal Medicine

## 2011-11-29 DIAGNOSIS — Z0001 Encounter for general adult medical examination with abnormal findings: Secondary | ICD-10-CM | POA: Insufficient documentation

## 2011-11-29 DIAGNOSIS — Z0000 Encounter for general adult medical examination without abnormal findings: Secondary | ICD-10-CM | POA: Insufficient documentation

## 2011-12-03 ENCOUNTER — Other Ambulatory Visit (INDEPENDENT_AMBULATORY_CARE_PROVIDER_SITE_OTHER): Payer: Federal, State, Local not specified - PPO

## 2011-12-03 ENCOUNTER — Ambulatory Visit (INDEPENDENT_AMBULATORY_CARE_PROVIDER_SITE_OTHER): Payer: Federal, State, Local not specified - PPO | Admitting: Internal Medicine

## 2011-12-03 ENCOUNTER — Encounter: Payer: Self-pay | Admitting: Internal Medicine

## 2011-12-03 VITALS — BP 138/70 | HR 81 | Temp 98.1°F | Ht 75.0 in | Wt 271.4 lb

## 2011-12-03 DIAGNOSIS — E119 Type 2 diabetes mellitus without complications: Secondary | ICD-10-CM

## 2011-12-03 DIAGNOSIS — Z0000 Encounter for general adult medical examination without abnormal findings: Secondary | ICD-10-CM

## 2011-12-03 LAB — CBC WITH DIFFERENTIAL/PLATELET
Basophils Relative: 0.4 % (ref 0.0–3.0)
Eosinophils Relative: 4.1 % (ref 0.0–5.0)
Hemoglobin: 15 g/dL (ref 13.0–17.0)
Lymphocytes Relative: 22.3 % (ref 12.0–46.0)
MCHC: 33.9 g/dL (ref 30.0–36.0)
Monocytes Relative: 8.1 % (ref 3.0–12.0)
Neutro Abs: 5.4 10*3/uL (ref 1.4–7.7)
Neutrophils Relative %: 65.1 % (ref 43.0–77.0)
RBC: 5.01 Mil/uL (ref 4.22–5.81)
WBC: 8.3 10*3/uL (ref 4.5–10.5)

## 2011-12-03 LAB — HEPATIC FUNCTION PANEL
ALT: 28 U/L (ref 0–53)
Albumin: 4.2 g/dL (ref 3.5–5.2)
Bilirubin, Direct: 0.1 mg/dL (ref 0.0–0.3)
Total Protein: 7.4 g/dL (ref 6.0–8.3)

## 2011-12-03 LAB — BASIC METABOLIC PANEL
BUN: 14 mg/dL (ref 6–23)
CO2: 28 mEq/L (ref 19–32)
Calcium: 9.2 mg/dL (ref 8.4–10.5)
Chloride: 104 mEq/L (ref 96–112)
Creatinine, Ser: 0.9 mg/dL (ref 0.4–1.5)

## 2011-12-03 LAB — URINALYSIS, ROUTINE W REFLEX MICROSCOPIC
Ketones, ur: NEGATIVE
Specific Gravity, Urine: 1.01 (ref 1.000–1.030)
Urine Glucose: NEGATIVE
Urobilinogen, UA: 0.2 (ref 0.0–1.0)
pH: 6.5 (ref 5.0–8.0)

## 2011-12-03 LAB — LIPID PANEL
Cholesterol: 119 mg/dL (ref 0–200)
HDL: 45.8 mg/dL (ref >39.00)
Total CHOL/HDL Ratio: 3
Triglycerides: 83 mg/dL (ref 0.0–149.0)

## 2011-12-03 LAB — PSA: PSA: 0.85 ng/mL (ref 0.10–4.00)

## 2011-12-03 LAB — TSH: TSH: 1.24 u[IU]/mL (ref 0.35–5.50)

## 2011-12-03 MED ORDER — ATORVASTATIN CALCIUM 10 MG PO TABS: 10.0000 mg | ORAL_TABLET | Freq: Every day | ORAL | Status: DC

## 2011-12-03 MED ORDER — AMLODIPINE BESYLATE 10 MG PO TABS: 10.0000 mg | ORAL_TABLET | Freq: Every day | ORAL | Status: DC

## 2011-12-03 MED ORDER — LISINOPRIL 20 MG PO TABS: 20.0000 mg | ORAL_TABLET | Freq: Every day | ORAL | Status: DC

## 2011-12-03 NOTE — Patient Instructions (Signed)
Your medications were refilled today Please Continue all medications as before Please go to LAB in the Basement for the blood and/or urine tests to be done today Please call the phone number [phone] (the PhoneTree System) for results of testing in 2-3 days;  When calling, simply dial the number, and when prompted enter the MRN number above (the Medical Record Number) and the # key, then the message should start. Please return in 1 year for your yearly visit, or sooner if needed, with Lab testing done 3-5 days before

## 2011-12-07 ENCOUNTER — Encounter: Payer: Self-pay | Admitting: Internal Medicine

## 2011-12-07 NOTE — Assessment & Plan Note (Signed)
stable overall by hx and exam, most recent data reviewed with pt, and pt to continue medical treatment as before  Lab Results  Component Value Date   HGBA1C 5.9 12/03/2011

## 2011-12-07 NOTE — Progress Notes (Signed)
Subjective:    Patient ID: Danny [name], male    DOB: 1951-11-21, 60 y.o.   MRN: [id number]  HPI  Here for wellness and f/u;  Overall doing ok;  Pt denies CP, worsening SOB, DOE, wheezing, orthopnea, PND, worsening LE edema, palpitations, dizziness or syncope.  Pt denies neurological change such as new Headache, facial or extremity weakness.  Pt denies polydipsia, polyuria, or low sugar symptoms. Pt states overall good compliance with treatment and medications, good tolerability, and trying to follow lower cholesterol diet.  Pt denies worsening depressive symptoms, suicidal ideation or panic. No fever, wt loss, night sweats, loss of appetite, or other constitutional symptoms.  Pt states good ability with ADL's, low fall risk, home safety reviewed and adequate, no significant changes in hearing or vision, and occasionally active with exercise.  No acute complaints Past Medical History  Diagnosis Date  . DIVERTICULOSIS, COLON 07/05/2008  . DM w/o Complication Type II 06/26/2008  . HYPERLIPIDEMIA 06/27/2008  . HYPERTENSION 06/27/2008  . OBSTRUCTIVE SLEEP APNEA 06/26/2008   Past Surgical History  Procedure Date  . Tonsillectomy   . Nose surgery 1970    reports that he has never smoked. He does not have any smokeless tobacco history on file. He reports that he drinks alcohol. His drug history not on file. family history is not on file. Allergies  Allergen Reactions  . Penicillins    Current Outpatient Prescriptions on File Prior to Visit  Medication Sig Dispense Refill  . aspirin 325 MG tablet Take 325 mg by mouth daily.        [name] co-enzyme Q-10 30 MG capsule Take 30 mg by mouth daily.        . Omega-3 Fatty Acids (FISH OIL) 500 MG CAPS Take by mouth 2 (two) times daily.         Review of Systems Review of Systems  Constitutional: Negative for diaphoresis, activity change, appetite change and unexpected weight change.  HENT: Negative for hearing loss, ear pain, facial swelling, mouth sores and  neck stiffness.   Eyes: Negative for pain, redness and visual disturbance.  Respiratory: Negative for shortness of breath and wheezing.   Cardiovascular: Negative for chest pain and palpitations.  Gastrointestinal: Negative for diarrhea, blood in stool, abdominal distention and rectal pain.  Genitourinary: Negative for hematuria, flank pain and decreased urine volume.  Musculoskeletal: Negative for myalgias and joint swelling.  Skin: Negative for color change and wound.  Neurological: Negative for syncope and numbness.  Hematological: Negative for adenopathy.  Psychiatric/Behavioral: Negative for hallucinations, self-injury, decreased concentration and agitation.      Objective:   Physical Exam BP 138/70  Pulse 81  Temp(Src) 98.1 F (36.7 C) (Oral)  Ht 6\' 3"  (1.905 m)  Wt 271 lb 6 oz (123.095 kg)  BMI 33.92 kg/m2  SpO2 96% Physical Exam  VS noted Constitutional: Pt is oriented to person, place, and time. Appears well-developed and well-nourished.  HENT:  Head: Normocephalic and atraumatic.  Right Ear: External ear normal.  Left Ear: External ear normal.  Nose: Nose normal.  Mouth/Throat: Oropharynx is clear and moist.  Eyes: Conjunctivae and EOM are normal. Pupils are equal, round, and reactive to light.  Neck: Normal range of motion. Neck supple. No JVD present. No tracheal deviation present.  Cardiovascular: Normal rate, regular rhythm, normal heart sounds and intact distal pulses.   Pulmonary/Chest: Effort normal and breath sounds normal.  Abdominal: Soft. Bowel sounds are normal. There is no tenderness.  Musculoskeletal: Normal range of  motion. Exhibits no edema.  Lymphadenopathy:  Has no cervical adenopathy.  Neurological: Pt is alert and oriented to person, place, and time. Pt has normal reflexes. No cranial nerve deficit.  Skin: Skin is warm and dry. No rash noted.  Psychiatric:  Has  normal mood and affect. Behavior is normal.     Assessment & Plan:

## 2011-12-07 NOTE — Assessment & Plan Note (Signed)

## 2012-03-01 ENCOUNTER — Telehealth: Payer: Self-pay | Admitting: *Deleted

## 2012-03-01 ENCOUNTER — Emergency Department (INDEPENDENT_AMBULATORY_CARE_PROVIDER_SITE_OTHER)
Admission: EM | Admit: 2012-03-01 | Discharge: 2012-03-01 | Disposition: A | Discharge status: Home Health | Payer: Federal, State, Local not specified - PPO | Source: Home / Self Care | Attending: Emergency Medicine | Admitting: Emergency Medicine

## 2012-03-01 ENCOUNTER — Encounter (HOSPITAL_COMMUNITY): Payer: Self-pay | Admitting: Emergency Medicine

## 2012-03-01 DIAGNOSIS — K122 Cellulitis and abscess of mouth: Secondary | ICD-10-CM

## 2012-03-01 DIAGNOSIS — T783XXA Angioneurotic edema, initial encounter: Secondary | ICD-10-CM

## 2012-03-01 MED ORDER — METHYLPREDNISOLONE ACETATE PF 80 MG/ML IJ SUSP
80.0000 mg | Freq: Once | INTRAMUSCULAR | Status: AC
  Administered 2012-03-01: 80 mg via INTRAMUSCULAR

## 2012-03-01 MED ORDER — METHYLPREDNISOLONE ACETATE 80 MG/ML IJ SUSP
INTRAMUSCULAR | Status: AC
  Filled 2012-03-01: qty 1

## 2012-03-01 MED ORDER — LOSARTAN POTASSIUM 50 MG PO TABS: 50.0000 mg | ORAL_TABLET | Freq: Every day | ORAL | Status: DC

## 2012-03-01 MED ORDER — PREDNISONE 10 MG PO TABS: ORAL_TABLET | ORAL | Status: DC

## 2012-03-01 NOTE — Telephone Encounter (Signed)
Ok to change to losartan 50 mg per day  - done per escript, with ROV in 2-3 wks (unless already has a similar appt)

## 2012-03-01 NOTE — Telephone Encounter (Signed)
Patient states he went to ER and was told to stop his Lisinopril and that he needed to be changed to another medication.    Please advise.

## 2012-03-01 NOTE — ED Provider Notes (Signed)
Chief Complaint  Patient presents with  . URI    History of Present Illness:   The patient is a 61 year old male with hypertension who is on lisinopril and obstructive sleep apnea who has been on CPAP for the past year. He has springtime allergies and because of nasal congestion, sneezing, rhinorrhea, and nasal itching took a loratadine tablet yesterday. Last night he used his CPAP machine but did not add any water since he didn't have distilled water. He woke up in the middle of the night feeling like his throat was swelling. He denies any soreness of the throat and he denies any swelling of the tongue of the face. No wheezing, coughing, or shortness of breath. He denies any hoarseness or difficulty swallowing. He's never had any kind of reaction before to any medication.  Review of Systems:  Other than noted above, the patient denies any of the following symptoms. Systemic:  No fever, chills, sweats, fatigue, myalgias, headache, or anorexia. Eye:  No redness, pain or drainage. ENT:  No earache, nasal congestion, rhinorrhea, sinus pressure, or sore throat. Lungs:  No cough, sputum production, wheezing, shortness of breath. Or chest pain. GI:  No nausea, vomiting, abdominal pain or diarrhea. Skin:  No rash or itching.  PMFSH:  Past medical history, family history, social history, meds, and allergies were reviewed.  Physical Exam:   Vital signs:  BP 133/78  Pulse 78  Temp(Src) 97.7 F (36.5 C) (Oral)  Resp 18  SpO2 97% General:  Alert, in no distress. No respiratory distress. Eye:  No conjunctival injection or drainage. ENT:  TMs and canals were normal, without erythema or inflammation.  Nasal mucosa was clear and uncongested, without drainage.  Mucous membranes were moist.  Pharynx was clear, without exudate or drainage.  There were no oral ulcerations or lesions. His uvula was markedly swollen and edematous, but his airway was intact and he was breathing without any respiratory  distress. Neck:  Supple, no adenopathy, tenderness or mass. Lungs:  No respiratory distress.  Lungs were clear to auscultation, without wheezes, rales or rhonchi.  Breath sounds were clear and equal bilaterally. Heart:  Regular rhythm, without gallops, murmers or rubs. Skin:  Clear, warm, and dry, without rash or lesions.  Course in Urgent Care Center:   He was given Solu-Medrol 80 mg IM.  Assessment:   Diagnoses that have been ruled out:  None  Diagnoses that are still under consideration:  None  Final diagnoses:  Angioedema - of the uvula, probably due to lisinopril.       Plan:   1.  The following meds were prescribed:   New Prescriptions   PREDNISONE (DELTASONE) 10 MG TABLET    Take 4 tabs daily for 4 days, 3 tabs daily for 4 days, 2 tabs daily for 4 days, then 1 tab daily for 4 days.   2.  The patient was instructed in symptomatic care and handouts were given. 3.  The patient was told to return if becoming worse in any way, if no better in 3 or 4 days, and given some red flag symptoms that would indicate earlier return. 4.  The patient was told to discontinue the lisinopril and loratadine. He may use fexofenadine or cetirizine and said as antihistamine. He should use his CPAP only with water. He is to followup with Dr. Jonny Ruiz in 2 or 3 days. I suggested that should his breathing become worse in any way that he call an ambulance and go directly to the  hospital emergency room.   Danny Likes, MD 03/01/12 [phone]

## 2012-03-01 NOTE — ED Notes (Signed)
PT HERE WITH NASAL CONGESTION THAT STARTED LAST NIGHT WITH HX SEASONAL ALLERGY BUT PT STATES HE FELT LIKE THROAT WAS CONSTRICTED AFTER SLEEPING WITH CPAP MACHINE WITHOUT INSTILLED WATER.DENIES SOB.VSS

## 2012-03-01 NOTE — Discharge Instructions (Signed)
Stop loratadine and use Allegra (fexofenadine) or Zyrtic (cetirazine) instead.  Stop lisinopril.   See Dr. Jonny Ruiz in 2 to 3 days.

## 2012-03-02 NOTE — Telephone Encounter (Signed)
Prednisone is the best anti-inflammatory medication we have, and for lower doses for short periods of time (about a wk) there is very little chance of significant problem such as elevated blood sugar, blood pressure or other issues, though the appetite can increase somewhat, there can be some irritability,  I think OK to take the prednisone

## 2012-03-02 NOTE — Telephone Encounter (Signed)
Patient informed of MD's instructions

## 2012-03-02 NOTE — Telephone Encounter (Signed)
Called the patient informed of MD's instructions.  The patient was given a prescription to start today on prednisone pills.  He received a steroid shot in the ER and was to start prednisone pills today, please advise as he would rather not take the pills.

## 2012-03-12 ENCOUNTER — Ambulatory Visit (INDEPENDENT_AMBULATORY_CARE_PROVIDER_SITE_OTHER): Payer: Federal, State, Local not specified - PPO | Admitting: Internal Medicine

## 2012-03-12 ENCOUNTER — Encounter: Payer: Self-pay | Admitting: Internal Medicine

## 2012-03-12 VITALS — BP 130/80 | HR 80 | Temp 97.8°F | Ht 75.0 in | Wt 286.2 lb

## 2012-03-12 DIAGNOSIS — F411 Generalized anxiety disorder: Secondary | ICD-10-CM

## 2012-03-12 DIAGNOSIS — J309 Allergic rhinitis, unspecified: Secondary | ICD-10-CM

## 2012-03-12 DIAGNOSIS — F419 Anxiety disorder, unspecified: Secondary | ICD-10-CM | POA: Insufficient documentation

## 2012-03-12 DIAGNOSIS — I10 Essential (primary) hypertension: Secondary | ICD-10-CM

## 2012-03-12 HISTORY — DX: Allergic rhinitis, unspecified: J30.9

## 2012-03-12 MED ORDER — ALPRAZOLAM 0.5 MG PO TABS: 0.5000 mg | ORAL_TABLET | Freq: Every day | ORAL | Status: AC | PRN

## 2012-03-12 NOTE — Assessment & Plan Note (Signed)
Improved, stable overall by hx and exam,  and pt to continue medical treatment as before  

## 2012-03-12 NOTE — Assessment & Plan Note (Signed)
stable overall by hx and exam, most recent data reviewed with pt, and pt to continue medical treatment as before  BP Readings from Last 3 Encounters:  03/12/12 130/80  03/01/12 133/78  12/03/11 138/70

## 2012-03-12 NOTE — Assessment & Plan Note (Signed)
Prob related to adjustment d/o with anxiety - for xanax prn, consider lexapro, decliens counseling,  to f/u any worsening symptoms or concerns

## 2012-03-12 NOTE — Patient Instructions (Signed)
Take all new medications as prescribed Continue all other medications as before  

## 2012-03-12 NOTE — Progress Notes (Signed)
Subjective:    Patient ID: Danny [name], male    DOB: February 20, 1951, 61 y.o.   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  Has 6 figure retirement yearly but needs to pay some bills, but in financial straits.  No other recent stressors.  Has had some anxiety for many yrs but as his life was fairly predictable seemed to get by wtihout panic.  Seen recently in ER for allergy flare with some throat symtpoms, but in retrospect thinks it was anxiety related, though also better now on ARB and allegra.  Did not take the prednisone.  Recent TSH normal dec 2012 Past Medical History  Diagnosis Date  . DIVERTICULOSIS, COLON 07/05/2008  . DM w/o Complication Type II 06/26/2008  . HYPERLIPIDEMIA 06/27/2008  . HYPERTENSION 06/27/2008  . OBSTRUCTIVE SLEEP APNEA 06/26/2008   Past Surgical History  Procedure Date  . Tonsillectomy   . Nose surgery 1970    reports that he has never smoked. He does not have any smokeless tobacco history on file. He reports that he drinks alcohol. His drug history not on file. family history is not on file. Allergies  Allergen Reactions  . Penicillins    Current Outpatient Prescriptions on File Prior to Visit  Medication Sig Dispense Refill  . amLODipine (NORVASC) 10 MG tablet Take 1 tablet (10 mg total) by mouth daily.  90 tablet  3  . aspirin 325 MG tablet Take 325 mg by mouth daily.        [name] atorvastatin (LIPITOR) 10 MG tablet Take 1 tablet (10 mg total) by mouth daily.  90 tablet  3  . co-enzyme Q-10 30 MG capsule Take 30 mg by mouth daily.        [name] losartan (COZAAR) 50 MG tablet Take 1 tablet (50 mg total) by mouth daily.  90 tablet  3  . Omega-3 Fatty Acids (FISH OIL) 500 MG CAPS Take by mouth 2 (two) times daily.        [name] loratadine (CLARITIN) 10 MG tablet Take 10 mg by mouth daily.      . predniSONE (DELTASONE) 10 MG tablet Take 4 tabs  daily for 4 days, 3 tabs daily for 4 days, 2 tabs daily for 4 days, then 1 tab daily for 4 days.  40 tablet  0   Review of Systems Review of Systems  Constitutional: Negative for diaphoresis and unexpected weight change.  HENT: Negative for drooling and tinnitus.   Eyes: Negative for photophobia and visual disturbance.  Respiratory: Negative for choking and stridor.   Gastrointestinal: Negative for vomiting and blood in stool.  Genitourinary: Negative for hematuria and decreased urine volume.     Objective:   Physical Exam BP 130/80  Pulse 80  Temp(Src) 97.8 F (36.6 C) (Oral)  Ht 6\' 3"  (1.905 m)  Wt 286 lb 4 oz (129.842 kg)  BMI 35.78 kg/m2  SpO2 97% Physical Exam  VS noted Constitutional: Pt appears well-developed and well-nourished.  HENT: Head: Normocephalic.  Right Ear: External ear normal.  Left Ear: External ear normal.  Eyes: Conjunctivae and EOM are normal. Pupils are equal, round, and reactive to light.  Neck: Normal range of motion. Neck supple.  Cardiovascular: Normal rate and regular rhythm.   Pulmonary/Chest: Effort normal and breath sounds normal.  Neurological: Pt is alert. No cranial nerve deficit.  Skin:  Skin is warm. No erythema.  Psychiatric: Pt behavior is normal. Thought content normal. 1-2+ nervous    Assessment & Plan:

## 2012-09-26 ENCOUNTER — Encounter (HOSPITAL_COMMUNITY): Payer: Self-pay | Admitting: *Deleted

## 2012-09-26 ENCOUNTER — Emergency Department (HOSPITAL_COMMUNITY)
Admission: EM | Admit: 2012-09-26 | Discharge: 2012-09-26 | Disposition: A | Discharge status: Home / Self Care | Payer: Federal, State, Local not specified - PPO | Source: Home / Self Care | Attending: Family Medicine | Admitting: Family Medicine

## 2012-09-26 DIAGNOSIS — L247 Irritant contact dermatitis due to plants, except food: Secondary | ICD-10-CM

## 2012-09-26 DIAGNOSIS — L255 Unspecified contact dermatitis due to plants, except food: Secondary | ICD-10-CM

## 2012-09-26 MED ORDER — CEPHALEXIN 500 MG PO CAPS: 500.0000 mg | ORAL_CAPSULE | Freq: Two times a day (BID) | ORAL | Status: DC

## 2012-09-26 MED ORDER — PREDNISONE 20 MG PO TABS: ORAL_TABLET | ORAL | Status: DC

## 2012-09-26 MED ORDER — HYDROXYZINE HCL 25 MG PO TABS: 25.0000 mg | ORAL_TABLET | Freq: Three times a day (TID) | ORAL | Status: DC | PRN

## 2012-09-26 MED ORDER — TRIAMCINOLONE ACETONIDE 0.5 % EX OINT: TOPICAL_OINTMENT | Freq: Two times a day (BID) | CUTANEOUS | Status: DC

## 2012-09-26 NOTE — ED Notes (Signed)
Pt reports poison ivy for one week with no improvement with otc cream

## 2012-09-28 NOTE — ED Provider Notes (Signed)
History     CSN: [id number]  Arrival date & time 09/26/12  1254   First MD Initiated Contact with Patient 09/26/12 1312      Chief Complaint  Patient presents with  . Rash    (Consider location/radiation/quality/duration/timing/severity/associated sxs/prior treatment) HPI Comments: 61 y/o male here c/o swelling redness, itchiness and tenderness in right fore arm after cleaning back yard and exposure to poison Ivy. Has bee using hydrocortisone cream with no improvement. No difficulty breathing.  no other skin areas involved. No fever or chills,   Past Medical History  Diagnosis Date  . DIVERTICULOSIS, COLON 07/05/2008  . DM w/o Complication Type II 06/26/2008  . HYPERLIPIDEMIA 06/27/2008  . HYPERTENSION 06/27/2008  . OBSTRUCTIVE SLEEP APNEA 06/26/2008  . Allergic rhinitis, cause unspecified 03/12/2012    Past Surgical History  Procedure Date  . Tonsillectomy   . Nose surgery 1970    History reviewed. No pertinent family history.  History  Substance Use Topics  . Smoking status: Never Smoker   . Smokeless tobacco: Not on file  . Alcohol Use: Yes      Review of Systems  Constitutional: Negative for fever and chills.  Skin: Positive for rash.       As per HPI  All other systems reviewed and are negative.    Allergies  Penicillins  Home Medications   Current Outpatient Rx  Name Route Sig Dispense Refill  . AMLODIPINE BESYLATE 10 MG PO TABS Oral Take 1 tablet (10 mg total) by mouth daily. 90 tablet 3  . ASPIRIN 325 MG PO TABS Oral Take 325 mg by mouth daily.      . ATORVASTATIN CALCIUM 10 MG PO TABS Oral Take 1 tablet (10 mg total) by mouth daily. 90 tablet 3  . COENZYME Q10 30 MG PO CAPS Oral Take 30 mg by mouth daily.      [name] LORATADINE 10 MG PO TABS Oral Take 10 mg by mouth daily.    [name] LOSARTAN POTASSIUM 50 MG PO TABS Oral Take 1 tablet (50 mg total) by mouth daily. 90 tablet 3  . FISH OIL 500 MG PO CAPS Oral Take by mouth 2 (two) times daily.      . CEPHALEXIN  500 MG PO CAPS Oral Take 1 capsule (500 mg total) by mouth 2 (two) times daily. 14 capsule 0  . HYDROXYZINE HCL 25 MG PO TABS Oral Take 1 tablet (25 mg total) by mouth every 8 (eight) hours as needed for itching. 20 tablet 0  . PREDNISONE 20 MG PO TABS  2 tabs po daily for 5 days 10 tablet 0  . TRIAMCINOLONE ACETONIDE 0.5 % EX OINT Topical Apply topically 2 (two) times daily. 30 g 0    BP 126/82  Pulse 86  Temp 97.7 F (36.5 C) (Oral)  Resp 22  SpO2 96%  Physical Exam  Nursing note and vitals reviewed. Constitutional: He is oriented to person, place, and time. He appears well-developed and well-nourished. No distress.  HENT:  Head: Normocephalic.  Mouth/Throat: Oropharynx is clear and moist.  Cardiovascular: Normal heart sounds.   Pulmonary/Chest: Effort normal and breath sounds normal. No respiratory distress. He has no wheezes.  Neurological: He is alert and oriented to person, place, and time.  Skin:       Right fore arm volar surface. Erythema with microvesicles and significant base swelling and some crusting with amber exudates in few spots.     ED Course  Procedures (including critical care time)  Labs Reviewed - No data to display No results found.   1. Contact dermatitis and eczema due to plant       MDM  Contact dermatis concerns for possible early over infection. Treated with prednisone, triamcinolone ointment, keflex and hydroxyzine. Asked to return if worsening symptoms despite following treatment.         Sharin Grave, MD 09/28/12 1331

## 2012-12-02 ENCOUNTER — Ambulatory Visit: Payer: Federal, State, Local not specified - PPO | Admitting: Internal Medicine

## 2012-12-14 ENCOUNTER — Other Ambulatory Visit (INDEPENDENT_AMBULATORY_CARE_PROVIDER_SITE_OTHER): Payer: Federal, State, Local not specified - PPO

## 2012-12-14 ENCOUNTER — Ambulatory Visit (INDEPENDENT_AMBULATORY_CARE_PROVIDER_SITE_OTHER): Payer: Federal, State, Local not specified - PPO | Admitting: Internal Medicine

## 2012-12-14 ENCOUNTER — Encounter: Payer: Self-pay | Admitting: Internal Medicine

## 2012-12-14 VITALS — BP 118/70 | HR 83 | Temp 97.4°F | Ht 75.0 in | Wt 292.0 lb

## 2012-12-14 DIAGNOSIS — Z0000 Encounter for general adult medical examination without abnormal findings: Secondary | ICD-10-CM

## 2012-12-14 DIAGNOSIS — E119 Type 2 diabetes mellitus without complications: Secondary | ICD-10-CM

## 2012-12-14 LAB — URINALYSIS, ROUTINE W REFLEX MICROSCOPIC
Bilirubin Urine: NEGATIVE
Ketones, ur: NEGATIVE
Leukocytes, UA: NEGATIVE
Specific Gravity, Urine: 1.02 (ref 1.000–1.030)
Urobilinogen, UA: 0.2 (ref 0.0–1.0)

## 2012-12-14 LAB — HEMOGLOBIN A1C: Hgb A1c MFr Bld: 6.3 % (ref 4.6–6.5)

## 2012-12-14 LAB — CBC WITH DIFFERENTIAL/PLATELET
Basophils Absolute: 0 10*3/uL (ref 0.0–0.1)
Eosinophils Absolute: 0.4 10*3/uL (ref 0.0–0.7)
HCT: 42.8 % (ref 39.0–52.0)
Lymphs Abs: 1.8 10*3/uL (ref 0.7–4.0)
Monocytes Relative: 9.8 % (ref 3.0–12.0)
Platelets: 196 10*3/uL (ref 150.0–400.0)
RDW: 12.8 % (ref 11.5–14.6)

## 2012-12-14 LAB — BASIC METABOLIC PANEL
CO2: 27 mEq/L (ref 19–32)
Glucose, Bld: 97 mg/dL (ref 70–99)
Potassium: 4.1 mEq/L (ref 3.5–5.1)
Sodium: 137 mEq/L (ref 135–145)

## 2012-12-14 LAB — TSH: TSH: 1.83 u[IU]/mL (ref 0.35–5.50)

## 2012-12-14 LAB — HEPATIC FUNCTION PANEL
AST: 28 U/L (ref 0–37)
Albumin: 4 g/dL (ref 3.5–5.2)
Alkaline Phosphatase: 68 U/L (ref 39–117)
Total Protein: 6.7 g/dL (ref 6.0–8.3)

## 2012-12-14 LAB — MICROALBUMIN / CREATININE URINE RATIO
Creatinine,U: 149.4 mg/dL
Microalb Creat Ratio: 0.2 mg/g (ref 0.0–30.0)
Microalb, Ur: 0.3 mg/dL (ref 0.0–1.9)

## 2012-12-14 MED ORDER — LOSARTAN POTASSIUM 50 MG PO TABS: 50.0000 mg | ORAL_TABLET | Freq: Every day | ORAL | Status: DC

## 2012-12-14 NOTE — Patient Instructions (Addendum)
Continue all other medications as before Your refills were sent today to the pharmacy You are otherwise up to date with prevention Please continue your efforts at being more active, low cholesterol diet, and weight control. Please go to LAB in the Basement for the blood and/or urine tests to be done today You will be contacted by phone if any changes need to be made immediately.  Otherwise, you will receive a letter about your results with an explanation, but please check with MyChart first. Thank you for enrolling in MyChart. Please follow the instructions below to securely access your online medical record. MyChart allows you to send messages to your doctor, view your test results, renew your prescriptions, schedule appointments, and more. To Log into MyChart, please go to [email or web address], and your Username is: [email or web address] Please return in 1 year for your yearly visit, or sooner if needed, with Lab testing done 3-5 days before

## 2012-12-14 NOTE — Assessment & Plan Note (Signed)

## 2012-12-14 NOTE — Assessment & Plan Note (Signed)
stable overall by hx and exam, most recent data reviewed with pt, and pt to continue medical treatment as before BP Readings from Last 3 Encounters:  12/14/12 118/70  09/26/12 126/82  03/12/12 130/80

## 2012-12-14 NOTE — Progress Notes (Signed)
Subjective:    Patient ID: Danny [name], male    DOB: Mar 14, 1951, 61 y.o.   MRN: [id number]  HPI  Here for wellness and f/u;  Overall doing ok;  Pt denies CP, worsening SOB, DOE, wheezing, orthopnea, PND, worsening LE edema, palpitations, dizziness or syncope.  Pt denies neurological change such as new Headache, facial or extremity weakness.  Pt denies polydipsia, polyuria, or low sugar symptoms. Pt states overall good compliance with treatment and medications, good tolerability, and trying to follow lower cholesterol diet.  Pt denies worsening depressive symptoms, suicidal ideation or panic. No fever, wt loss, night sweats, loss of appetite, or other constitutional symptoms.  Pt states good ability with ADL's, low fall risk, home safety reviewed and adequate, no significant changes in hearing or vision, and occasionally active with exercise.   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  Has not had recent OSA f/u but had good compliacne, denies signficant daytime somnolence. Past Medical History  Diagnosis Date  . DIVERTICULOSIS, COLON 07/05/2008  . DM w/o Complication Type II 06/26/2008  . HYPERLIPIDEMIA 06/27/2008  . HYPERTENSION 06/27/2008  . OBSTRUCTIVE SLEEP APNEA 06/26/2008  . Allergic rhinitis, cause unspecified 03/12/2012   Past Surgical History  Procedure Date  . Tonsillectomy   . Nose surgery 1970    reports that he has never smoked. He does not have any smokeless tobacco history on file. He reports that he drinks alcohol. His drug history not on file. family history is not on file. Allergies  Allergen Reactions  . Penicillins    Current Outpatient Prescriptions on File Prior to Visit  Medication Sig Dispense Refill  . aspirin 325 MG tablet Take 325 mg by mouth daily.        [name] co-enzyme Q-10 30 MG capsule Take 30 mg by mouth daily.        [name] losartan (COZAAR) 50 MG tablet Take 1 tablet (50 mg total) by mouth daily.  90 tablet  3  . Omega-3 Fatty Acids (FISH  OIL) 500 MG CAPS Take by mouth 2 (two) times daily.        [name] amLODipine (NORVASC) 10 MG tablet Take 1 tablet (10 mg total) by mouth daily.  90 tablet  3  . atorvastatin (LIPITOR) 10 MG tablet Take 1 tablet (10 mg total) by mouth daily.  90 tablet  3  . hydrOXYzine (ATARAX/VISTARIL) 25 MG tablet Take 1 tablet (25 mg total) by mouth every 8 (eight) hours as needed for itching.  20 tablet  0  . [DISCONTINUED] lisinopril (PRINIVIL,ZESTRIL) 20 MG tablet Take 1 tablet (20 mg total) by mouth daily.  90 tablet  3   Review of Systems Review of Systems  Constitutional: Negative for diaphoresis, activity change, appetite change and unexpected weight change.  HENT: Negative for hearing loss, ear pain, facial swelling, mouth sores and neck stiffness.   Eyes: Negative for pain, redness and visual disturbance.  Respiratory: Negative for shortness of breath and wheezing.   Cardiovascular: Negative for chest pain and palpitations.  Gastrointestinal: Negative for diarrhea, blood in stool, abdominal distention and rectal pain.  Genitourinary: Negative for hematuria, flank pain and decreased urine volume.  Musculoskeletal: Negative for myalgias and joint swelling.  Skin: Negative for color change and wound.  Neurological: Negative for syncope and numbness.  Hematological: Negative for adenopathy.  Psychiatric/Behavioral: Negative for hallucinations, self-injury, decreased concentration and agitation.      Objective:   Physical Exam BP  118/70  Pulse 83  Temp 97.4 F (36.3 C) (Oral)  Ht 6\' 3"  (1.905 m)  Wt 292 lb (132.45 kg)  BMI 36.50 kg/m2  SpO2 96% Physical Exam  VS noted Constitutional: Pt is oriented to person, place, and time. Appears well-developed and well-nourished. /severe obese HENT:  Head: Normocephalic and atraumatic.  Right Ear: External ear normal.  Left Ear: External ear normal.  Nose: Nose normal.  Mouth/Throat: Oropharynx is clear and moist.  Eyes: Conjunctivae and EOM are normal.  Pupils are equal, round, and reactive to light.  Neck: Normal range of motion. Neck supple. No JVD present. No tracheal deviation present.  Cardiovascular: Normal rate, regular rhythm, normal heart sounds and intact distal pulses.   Pulmonary/Chest: Effort normal and breath sounds normal.  Abdominal: Soft. Bowel sounds are normal. There is no tenderness.  Musculoskeletal: Normal range of motion. Exhibits no edema.  Lymphadenopathy:  Has no cervical adenopathy.  Neurological: Pt is alert and oriented to person, place, and time. Pt has normal reflexes. No cranial nerve deficit.  Skin: Skin is warm and dry. No rash noted.  Psychiatric:  Has  normal mood and affect. Behavior is normal.     Assessment & Plan:

## 2012-12-20 ENCOUNTER — Other Ambulatory Visit: Payer: Self-pay

## 2012-12-20 ENCOUNTER — Other Ambulatory Visit: Payer: Self-pay | Admitting: *Deleted

## 2012-12-20 MED ORDER — ATORVASTATIN CALCIUM 10 MG PO TABS: 10.0000 mg | ORAL_TABLET | Freq: Every day | ORAL | Status: DC

## 2012-12-20 MED ORDER — AMLODIPINE BESYLATE 10 MG PO TABS: 10.0000 mg | ORAL_TABLET | Freq: Every day | ORAL | Status: DC

## 2013-10-27 ENCOUNTER — Other Ambulatory Visit: Payer: Self-pay

## 2013-12-11 ENCOUNTER — Other Ambulatory Visit: Payer: Self-pay | Admitting: Internal Medicine

## 2013-12-14 ENCOUNTER — Ambulatory Visit (INDEPENDENT_AMBULATORY_CARE_PROVIDER_SITE_OTHER): Payer: Federal, State, Local not specified - PPO | Admitting: Internal Medicine

## 2013-12-14 ENCOUNTER — Other Ambulatory Visit (INDEPENDENT_AMBULATORY_CARE_PROVIDER_SITE_OTHER): Payer: Federal, State, Local not specified - PPO

## 2013-12-14 ENCOUNTER — Encounter: Payer: Self-pay | Admitting: Internal Medicine

## 2013-12-14 VITALS — BP 132/80 | HR 79 | Temp 97.1°F | Ht 75.0 in | Wt 293.0 lb

## 2013-12-14 DIAGNOSIS — Z0000 Encounter for general adult medical examination without abnormal findings: Secondary | ICD-10-CM

## 2013-12-14 DIAGNOSIS — R7302 Impaired glucose tolerance (oral): Secondary | ICD-10-CM

## 2013-12-14 DIAGNOSIS — R7309 Other abnormal glucose: Secondary | ICD-10-CM

## 2013-12-14 LAB — CBC WITH DIFFERENTIAL/PLATELET
Basophils Absolute: 0 10*3/uL (ref 0.0–0.1)
Basophils Relative: 0.3 % (ref 0.0–3.0)
Eosinophils Relative: 5.9 % — ABNORMAL HIGH (ref 0.0–5.0)
HCT: 45 % (ref 39.0–52.0)
Hemoglobin: 15.1 g/dL (ref 13.0–17.0)
Lymphocytes Relative: 27.3 % (ref 12.0–46.0)
Lymphs Abs: 1.7 10*3/uL (ref 0.7–4.0)
Monocytes Relative: 9.1 % (ref 3.0–12.0)
Platelets: 197 10*3/uL (ref 150.0–400.0)
RDW: 13.2 % (ref 11.5–14.6)
WBC: 6.2 10*3/uL (ref 4.5–10.5)

## 2013-12-14 LAB — URINALYSIS, ROUTINE W REFLEX MICROSCOPIC
Leukocytes, UA: NEGATIVE
Nitrite: NEGATIVE
Specific Gravity, Urine: 1.02 (ref 1.000–1.030)
Urine Glucose: NEGATIVE
Urobilinogen, UA: 0.2 (ref 0.0–1.0)
pH: 6.5 (ref 5.0–8.0)

## 2013-12-14 LAB — BASIC METABOLIC PANEL
BUN: 13 mg/dL (ref 6–23)
CO2: 29 mEq/L (ref 19–32)
Calcium: 9.4 mg/dL (ref 8.4–10.5)
GFR: 93.77 mL/min (ref >60.00)
Glucose, Bld: 114 mg/dL — ABNORMAL HIGH (ref 70–99)
Potassium: 4.4 mEq/L (ref 3.5–5.1)

## 2013-12-14 LAB — HEPATIC FUNCTION PANEL
ALT: 42 U/L (ref 0–53)
Albumin: 4.4 g/dL (ref 3.5–5.2)
Alkaline Phosphatase: 68 U/L (ref 39–117)
Total Protein: 7.1 g/dL (ref 6.0–8.3)

## 2013-12-14 LAB — LIPID PANEL
HDL: 37.2 mg/dL — ABNORMAL LOW (ref >39.00)
LDL Cholesterol: 71 mg/dL (ref 0–99)
Total CHOL/HDL Ratio: 3
VLDL: 19.8 mg/dL (ref 0.0–40.0)

## 2013-12-14 LAB — PSA: PSA: 1.05 ng/mL (ref 0.10–4.00)

## 2013-12-14 LAB — TSH: TSH: 2.1 u[IU]/mL (ref 0.35–5.50)

## 2013-12-14 MED ORDER — AMLODIPINE BESYLATE 10 MG PO TABS: 10.0000 mg | ORAL_TABLET | Freq: Every day | ORAL | Status: DC

## 2013-12-14 MED ORDER — ATORVASTATIN CALCIUM 10 MG PO TABS: 10.0000 mg | ORAL_TABLET | Freq: Every day | ORAL | Status: DC

## 2013-12-14 MED ORDER — LOSARTAN POTASSIUM 50 MG PO TABS: 50.0000 mg | ORAL_TABLET | Freq: Every day | ORAL | Status: DC

## 2013-12-14 NOTE — Assessment & Plan Note (Signed)

## 2013-12-14 NOTE — Progress Notes (Signed)
Pre-visit discussion using our clinic review tool. No additional management support is needed unless otherwise documented below in the visit note.  

## 2013-12-14 NOTE — Progress Notes (Signed)
Subjective:    Patient ID: Danny [name], male    DOB: 1951-04-08, 62 y.o.   MRN: [id number]  HPI  Here for wellness and f/u;  Overall doing ok;  Pt denies CP, worsening SOB, DOE, wheezing, orthopnea, PND, worsening LE edema, palpitations, dizziness or syncope.  Pt denies neurological change such as new headache, facial or extremity weakness.  Pt denies polydipsia, polyuria, or low sugar symptoms. Pt states overall good compliance with treatment and medications, good tolerability, and has been trying to follow lower cholesterol diet.  Pt denies worsening depressive symptoms, suicidal ideation or panic. No fever, night sweats, wt loss, loss of appetite, or other constitutional symptoms.  Pt states good ability with ADL's, has low fall risk, home safety reviewed and adequate, no other significant changes in hearing or vision, and only occasionally active with exercise.  No acute complaints.  Plans to get back to walking 1 hr per day when weather improved Past Medical History  Diagnosis Date  . DIVERTICULOSIS, COLON 07/05/2008  . DM w/o Complication Type II 06/26/2008  . HYPERLIPIDEMIA 06/27/2008  . HYPERTENSION 06/27/2008  . OBSTRUCTIVE SLEEP APNEA 06/26/2008  . Allergic rhinitis, cause unspecified 03/12/2012   Past Surgical History  Procedure Laterality Date  . Tonsillectomy    . Nose surgery  1970    reports that he has never smoked. He does not have any smokeless tobacco history on file. He reports that he drinks alcohol. His drug history is not on file. family history is not on file. Allergies  Allergen Reactions  . Penicillins    Current Outpatient Prescriptions on File Prior to Visit  Medication Sig Dispense Refill  . aspirin 325 MG tablet Take 325 mg by mouth daily.        [name] co-enzyme Q-10 30 MG capsule Take 30 mg by mouth daily.        . hydrOXYzine (ATARAX/VISTARIL) 25 MG tablet Take 1 tablet (25 mg total) by mouth every 8 (eight) hours as needed for itching.  20 tablet  0  . Omega-3  Fatty Acids (FISH OIL) 500 MG CAPS Take by mouth 2 (two) times daily.        . [DISCONTINUED] lisinopril (PRINIVIL,ZESTRIL) 20 MG tablet Take 1 tablet (20 mg total) by mouth daily.  90 tablet  3   No current facility-administered medications on file prior to visit.   Review of Systems Constitutional: Negative for diaphoresis, activity change, appetite change or unexpected weight change.  HENT: Negative for hearing loss, ear pain, facial swelling, mouth sores and neck stiffness.   Eyes: Negative for pain, redness and visual disturbance.  Respiratory: Negative for shortness of breath and wheezing.   Cardiovascular: Negative for chest pain and palpitations.  Gastrointestinal: Negative for diarrhea, blood in stool, abdominal distention or other pain Genitourinary: Negative for hematuria, flank pain or change in urine volume.  Musculoskeletal: Negative for myalgias and joint swelling.  Skin: Negative for color change and wound.  Neurological: Negative for syncope and numbness. other than noted Hematological: Negative for adenopathy.  Psychiatric/Behavioral: Negative for hallucinations, self-injury, decreased concentration and agitation.      Objective:   Physical Exam BP 132/80  Pulse 79  Temp(Src) 97.1 F (36.2 C) (Oral)  Ht 6\' 3"  (1.905 m)  Wt 293 lb (132.904 kg)  BMI 36.62 kg/m2  SpO2 94% VS noted,  Constitutional: Pt is oriented to person, place, and time. Appears well-developed and well-nourished.  Head: Normocephalic and atraumatic.  Right Ear: External ear normal.  Left Ear: External ear normal.  Nose: Nose normal.  Mouth/Throat: Oropharynx is clear and moist.  Eyes: Conjunctivae and EOM are normal. Pupils are equal, round, and reactive to light.  Neck: Normal range of motion. Neck supple. No JVD present. No tracheal deviation present.  Cardiovascular: Normal rate, regular rhythm, normal heart sounds and intact distal pulses.   Pulmonary/Chest: Effort normal and breath  sounds normal.  Abdominal: Soft. Bowel sounds are normal. There is no tenderness. No HSM  Musculoskeletal: Normal range of motion. Exhibits no edema.  Lymphadenopathy:  Has no cervical adenopathy.  Neurological: Pt is alert and oriented to person, place, and time. Pt has normal reflexes. No cranial nerve deficit.  Skin: Skin is warm and dry. No rash noted.  Psychiatric:  Has  normal mood and affect. Behavior is normal.     Assessment & Plan:

## 2013-12-14 NOTE — Patient Instructions (Addendum)
Your EKG was OK today  Please check with your insurance about coverage for the shingles shot Please continue all other medications as before, and refills have been done if requested. Please have the pharmacy call with any other refills you may need. Please continue your efforts at being more active, low cholesterol diet, and weight control. You are otherwise up to date with prevention measures today. Please keep your appointments with your specialists as you may have planned  Please go to the LAB in the Basement (turn left off the elevator) for the tests to be done today You will be contacted by phone if any changes need to be made immediately.  Otherwise, you will receive a letter about your results with an explanation, but please check with MyChart first.  Please remember to sign up for My Chart if you have not done so, as this will be important to you in the future with finding out test results, communicating by private email, and scheduling acute appointments online when needed.  Please return in 1 year for your yearly visit, or sooner if needed, with Lab testing done 3-5 days before

## 2014-10-06 ENCOUNTER — Other Ambulatory Visit: Payer: Self-pay

## 2014-12-25 ENCOUNTER — Telehealth: Payer: Self-pay | Admitting: Internal Medicine

## 2014-12-25 MED ORDER — AMLODIPINE BESYLATE 10 MG PO TABS: 10.0000 mg | ORAL_TABLET | Freq: Every day | ORAL | Status: DC

## 2014-12-25 MED ORDER — ATORVASTATIN CALCIUM 10 MG PO TABS: 10.0000 mg | ORAL_TABLET | Freq: Every day | ORAL | Status: DC

## 2014-12-25 MED ORDER — LOSARTAN POTASSIUM 50 MG PO TABS: 50.0000 mg | ORAL_TABLET | Freq: Every day | ORAL | Status: DC

## 2014-12-25 NOTE — Telephone Encounter (Signed)
Notified pt meds sent to [facility] until appt...Raechel Chute

## 2014-12-25 NOTE — Telephone Encounter (Signed)
Patient scheduled cpe for March 1st (next available).  He is requesting all meds to be refilled through that time period.  Patient uses [facility] at [facility] [address]

## 2015-02-20 ENCOUNTER — Encounter: Payer: Self-pay | Admitting: Internal Medicine

## 2015-02-20 ENCOUNTER — Other Ambulatory Visit (INDEPENDENT_AMBULATORY_CARE_PROVIDER_SITE_OTHER): Payer: Federal, State, Local not specified - PPO

## 2015-02-20 ENCOUNTER — Other Ambulatory Visit: Payer: Self-pay

## 2015-02-20 ENCOUNTER — Ambulatory Visit (INDEPENDENT_AMBULATORY_CARE_PROVIDER_SITE_OTHER): Payer: Federal, State, Local not specified - PPO | Admitting: Internal Medicine

## 2015-02-20 VITALS — BP 138/90 | HR 69 | Temp 97.8°F | Resp 18 | Ht 75.0 in | Wt 300.1 lb

## 2015-02-20 DIAGNOSIS — R7302 Impaired glucose tolerance (oral): Secondary | ICD-10-CM

## 2015-02-20 DIAGNOSIS — Z0000 Encounter for general adult medical examination without abnormal findings: Secondary | ICD-10-CM

## 2015-02-20 LAB — URINALYSIS, ROUTINE W REFLEX MICROSCOPIC
Bilirubin Urine: NEGATIVE
Ketones, ur: NEGATIVE
Leukocytes, UA: NEGATIVE
Nitrite: NEGATIVE
Specific Gravity, Urine: 1.025
Total Protein, Urine: NEGATIVE
Urine Glucose: NEGATIVE
Urobilinogen, UA: 0.2
pH: 6 (ref 5.0–8.0)

## 2015-02-20 LAB — HEPATIC FUNCTION PANEL
ALT: 27 U/L (ref 0–53)
AST: 21 U/L (ref 0–37)
Albumin: 4.6 g/dL (ref 3.5–5.2)
Alkaline Phosphatase: 85 U/L (ref 39–117)
BILIRUBIN DIRECT: 0.1 mg/dL (ref 0.0–0.3)
TOTAL PROTEIN: 6.9 g/dL (ref 6.0–8.3)
Total Bilirubin: 0.6 mg/dL (ref 0.2–1.2)

## 2015-02-20 LAB — LIPID PANEL
Cholesterol: 111 mg/dL (ref 0–200)
HDL: 37.5 mg/dL — ABNORMAL LOW
LDL Cholesterol: 54 mg/dL (ref 0–99)
NonHDL: 73.5
Total CHOL/HDL Ratio: 3
Triglycerides: 96 mg/dL (ref 0.0–149.0)
VLDL: 19.2 mg/dL (ref 0.0–40.0)

## 2015-02-20 LAB — CBC WITH DIFFERENTIAL/PLATELET
BASOS PCT: 0.8 % (ref 0.0–3.0)
Basophils Absolute: 0 10*3/uL (ref 0.0–0.1)
Eosinophils Absolute: 0.5 10*3/uL (ref 0.0–0.7)
Eosinophils Relative: 9.2 % — ABNORMAL HIGH (ref 0.0–5.0)
HCT: 43.9 % (ref 39.0–52.0)
Hemoglobin: 14.9 g/dL (ref 13.0–17.0)
LYMPHS PCT: 28.4 % (ref 12.0–46.0)
Lymphs Abs: 1.6 10*3/uL (ref 0.7–4.0)
MCHC: 33.9 g/dL (ref 30.0–36.0)
MCV: 85.8 fl (ref 78.0–100.0)
MONOS PCT: 8.8 % (ref 3.0–12.0)
Monocytes Absolute: 0.5 10*3/uL (ref 0.1–1.0)
Neutro Abs: 2.9 10*3/uL (ref 1.4–7.7)
Neutrophils Relative %: 52.8 % (ref 43.0–77.0)
Platelets: 195 10*3/uL (ref 150.0–400.0)
RBC: 5.12 Mil/uL (ref 4.22–5.81)
RDW: 13.2 % (ref 11.5–15.5)
WBC: 5.5 10*3/uL (ref 4.0–10.5)

## 2015-02-20 LAB — BASIC METABOLIC PANEL WITH GFR
BUN: 11 mg/dL (ref 6–23)
CO2: 30 meq/L (ref 19–32)
Calcium: 9.1 mg/dL (ref 8.4–10.5)
Chloride: 104 meq/L (ref 96–112)
Creatinine, Ser: 0.99 mg/dL (ref 0.40–1.50)
GFR: 81.53 mL/min
Glucose, Bld: 135 mg/dL — ABNORMAL HIGH (ref 70–99)
Potassium: 4.3 meq/L (ref 3.5–5.1)
Sodium: 140 meq/L (ref 135–145)

## 2015-02-20 LAB — HEMOGLOBIN A1C: Hgb A1c MFr Bld: 6.7 % — ABNORMAL HIGH (ref 4.6–6.5)

## 2015-02-20 LAB — TSH: TSH: 1.42 u[IU]/mL (ref 0.35–4.50)

## 2015-02-20 LAB — PSA: PSA: 0.78 ng/mL (ref 0.10–4.00)

## 2015-02-20 MED ORDER — ATORVASTATIN CALCIUM 10 MG PO TABS: 10.0000 mg | ORAL_TABLET | Freq: Every day | ORAL | Status: DC

## 2015-02-20 MED ORDER — LOSARTAN POTASSIUM 50 MG PO TABS: 50.0000 mg | ORAL_TABLET | Freq: Every day | ORAL | Status: DC

## 2015-02-20 MED ORDER — AMLODIPINE BESYLATE 10 MG PO TABS: 10.0000 mg | ORAL_TABLET | Freq: Every day | ORAL | Status: DC

## 2015-02-20 NOTE — Patient Instructions (Signed)

## 2015-02-20 NOTE — Progress Notes (Signed)
Subjective:    Patient ID: Danny SidleRobert M Birky, male    DOB: 21-Jul-1951, 64 y.o.   MRN: [id number]  HPI  Here for wellness and f/u;  Overall doing ok;  Pt denies CP, worsening SOB, DOE, wheezing, orthopnea, PND, worsening LE edema, palpitations, dizziness or syncope.  Pt denies neurological change such as new headache, facial or extremity weakness.  Pt denies polydipsia, polyuria, or low sugar symptoms. Pt states overall good compliance with treatment and medications, good tolerability, and has been trying to follow lower cholesterol diet.  Pt denies worsening depressive symptoms, suicidal ideation or panic. No fever, night sweats, wt loss, loss of appetite, or other constitutional symptoms.  Pt states good ability with ADL's, has low fall risk, home safety reviewed and adequate, no other significant changes in hearing or vision, and only occasionally active with exercise. No current complaints. Biggest challenge is finding time to be more active. Wt Readings from Last 3 Encounters:  02/20/15 300 lb 1.3 oz (136.115 kg)  12/14/13 293 lb (132.904 kg)  12/14/12 292 lb (132.45 kg)   Past Medical History  Diagnosis Date  . DIVERTICULOSIS, COLON 07/05/2008  . DM w/o Complication Type II 06/26/2008  . HYPERLIPIDEMIA 06/27/2008  . HYPERTENSION 06/27/2008  . OBSTRUCTIVE SLEEP APNEA 06/26/2008  . Allergic rhinitis, cause unspecified 03/12/2012   Past Surgical History  Procedure Laterality Date  . Tonsillectomy    . Nose surgery  1970    reports that he has never smoked. He does not have any smokeless tobacco history on file. He reports that he drinks alcohol. His drug history is not on file. family history is not on file. Allergies  Allergen Reactions  . Penicillins    Current Outpatient Prescriptions on File Prior to Visit  Medication Sig Dispense Refill  . amLODipine (NORVASC) 10 MG tablet Take 1 tablet (10 mg total) by mouth daily. 90 tablet 0  . aspirin 325 MG tablet Take 325 mg by mouth daily.        [name]. atorvastatin (LIPITOR) 10 MG tablet Take 1 tablet (10 mg total) by mouth daily. 90 tablet 0  . hydrOXYzine (ATARAX/VISTARIL) 25 MG tablet Take 1 tablet (25 mg total) by mouth every 8 (eight) hours as needed for itching. 20 tablet 0  . losartan (COZAAR) 50 MG tablet Take 1 tablet (50 mg total) by mouth daily. 90 tablet 0  . Omega-3 Fatty Acids (FISH OIL) 500 MG CAPS Take by mouth 2 (two) times daily.      . [DISCONTINUED] lisinopril (PRINIVIL,ZESTRIL) 20 MG tablet Take 1 tablet (20 mg total) by mouth daily. 90 tablet 3   No current facility-administered medications on file prior to visit.   Review of Systems Constitutional: Negative for increased diaphoresis, other activity, appetite or other siginficant weight change  HENT: Negative for worsening hearing loss, ear pain, facial swelling, mouth sores and neck stiffness.   Eyes: Negative for other worsening pain, redness or visual disturbance.  Respiratory: Negative for shortness of breath and wheezing.   Cardiovascular: Negative for chest pain and palpitations.  Gastrointestinal: Negative for diarrhea, blood in stool, abdominal distention or other pain Genitourinary: Negative for hematuria, flank pain or change in urine volume.  Musculoskeletal: Negative for myalgias or other joint complaints.  Skin: Negative for color change and wound.  Neurological: Negative for syncope and numbness. other than noted Hematological: Negative for adenopathy. or other swelling Psychiatric/Behavioral: Negative for hallucinations, self-injury, decreased concentration or other worsening agitation.      Objective:  Physical Exam BP 138/90 mmHg  Pulse 69  Temp(Src) 97.8 F (36.6 C) (Oral)  Resp 18  Ht  (1.905 m)  Wt 300 lb 1.3 oz (136.115 kg)  BMI 37.51 kg/m2  SpO2 96% VS noted,  Constitutional: Pt is oriented to person, place, and time. Appears well-developed and well-nourished. Lavella Lemons Head: Normocephalic and atraumatic.  Right Ear: External  ear normal.  Left Ear: External ear normal.  Nose: Nose normal.  Mouth/Throat: Oropharynx is clear and moist.  Eyes: Conjunctivae and EOM are normal. Pupils are equal, round, and reactive to light.  Neck: Normal range of motion. Neck supple. No JVD present. No tracheal deviation present.  Cardiovascular: Normal rate, regular rhythm, normal heart sounds and intact distal pulses.   Pulmonary/Chest: Effort normal and breath sounds without rales or wheezing  Abdominal: Soft. Bowel sounds are normal. NT. No HSM  Musculoskeletal: Normal range of motion. Exhibits no edema.  Lymphadenopathy:  Has no cervical adenopathy.  Neurological: Pt is alert and oriented to person, place, and time. Pt has normal reflexes. No cranial nerve deficit. Motor grossly intact Skin: Skin is warm and dry. No rash noted.  Psychiatric:  Has normal mood and affect. Behavior is normal.      Assessment & Plan:

## 2015-02-20 NOTE — Assessment & Plan Note (Addendum)

## 2015-02-20 NOTE — Assessment & Plan Note (Signed)
Asympt, stable overall by history and exam, recent data reviewed with pt, and pt to continue medical treatment as before,  to f/u any worsening symptoms or concerns Lab Results  Component Value Date   HGBA1C 6.3 12/14/2013   For f/u lab

## 2015-02-20 NOTE — Progress Notes (Signed)
Pre visit review using our clinic review tool, if applicable. No additional management support is needed unless otherwise documented below in the visit note. 

## 2015-06-18 ENCOUNTER — Other Ambulatory Visit: Payer: Self-pay

## 2016-02-21 ENCOUNTER — Other Ambulatory Visit (INDEPENDENT_AMBULATORY_CARE_PROVIDER_SITE_OTHER): Payer: Federal, State, Local not specified - PPO

## 2016-02-21 DIAGNOSIS — R7302 Impaired glucose tolerance (oral): Secondary | ICD-10-CM

## 2016-02-21 DIAGNOSIS — Z0000 Encounter for general adult medical examination without abnormal findings: Secondary | ICD-10-CM | POA: Diagnosis not present

## 2016-02-21 LAB — HEPATIC FUNCTION PANEL
ALT: 50 U/L (ref 0–53)
AST: 33 U/L (ref 0–37)
Albumin: 4.8 g/dL (ref 3.5–5.2)
Alkaline Phosphatase: 67 U/L (ref 39–117)
BILIRUBIN DIRECT: 0.1 mg/dL (ref 0.0–0.3)
TOTAL PROTEIN: 7.1 g/dL (ref 6.0–8.3)
Total Bilirubin: 0.5 mg/dL (ref 0.2–1.2)

## 2016-02-21 LAB — CBC WITH DIFFERENTIAL/PLATELET
BASOS ABS: 0 10*3/uL (ref 0.0–0.1)
Basophils Relative: 0.5 % (ref 0.0–3.0)
EOS ABS: 0.4 10*3/uL (ref 0.0–0.7)
Eosinophils Relative: 5.7 % — ABNORMAL HIGH (ref 0.0–5.0)
HEMATOCRIT: 42.8 % (ref 39.0–52.0)
Hemoglobin: 14.4 g/dL (ref 13.0–17.0)
LYMPHS ABS: 1.6 10*3/uL (ref 0.7–4.0)
LYMPHS PCT: 24.2 % (ref 12.0–46.0)
MCHC: 33.7 g/dL (ref 30.0–36.0)
MCV: 85.6 fl (ref 78.0–100.0)
Monocytes Absolute: 0.6 10*3/uL (ref 0.1–1.0)
Monocytes Relative: 8.5 % (ref 3.0–12.0)
NEUTROS ABS: 4 10*3/uL (ref 1.4–7.7)
NEUTROS PCT: 61.1 % (ref 43.0–77.0)
PLATELETS: 199 10*3/uL (ref 150.0–400.0)
RBC: 5 Mil/uL (ref 4.22–5.81)
RDW: 13.2 % (ref 11.5–15.5)
WBC: 6.6 10*3/uL (ref 4.0–10.5)

## 2016-02-21 LAB — URINALYSIS, ROUTINE W REFLEX MICROSCOPIC
BILIRUBIN URINE: NEGATIVE
Hgb urine dipstick: NEGATIVE
Leukocytes, UA: NEGATIVE
Nitrite: NEGATIVE
PH: 7 (ref 5.0–8.0)
Specific Gravity, Urine: 1.015 (ref 1.000–1.030)
TOTAL PROTEIN, URINE-UPE24: NEGATIVE
UROBILINOGEN UA: 0.2 (ref 0.0–1.0)
Urine Glucose: NEGATIVE

## 2016-02-21 LAB — BASIC METABOLIC PANEL
BUN: 14 mg/dL (ref 6–23)
CALCIUM: 9.3 mg/dL (ref 8.4–10.5)
CO2: 28 meq/L (ref 19–32)
CREATININE: 1.05 mg/dL (ref 0.40–1.50)
Chloride: 106 mEq/L (ref 96–112)
GFR: 75.93 mL/min (ref >60.00)
Glucose, Bld: 101 mg/dL — ABNORMAL HIGH (ref 70–99)
Potassium: 4.1 mEq/L (ref 3.5–5.1)
SODIUM: 141 meq/L (ref 135–145)

## 2016-02-21 LAB — LIPID PANEL
CHOL/HDL RATIO: 3
Cholesterol: 93 mg/dL (ref 0–200)
HDL: 33.1 mg/dL — AB (ref >39.00)
LDL CALC: 39 mg/dL (ref 0–99)
NONHDL: 60.05
Triglycerides: 104 mg/dL (ref 0.0–149.0)
VLDL: 20.8 mg/dL (ref 0.0–40.0)

## 2016-02-21 LAB — PSA: PSA: 0.95 ng/mL (ref 0.10–4.00)

## 2016-02-21 LAB — HEMOGLOBIN A1C: HEMOGLOBIN A1C: 6.8 % — AB (ref 4.6–6.5)

## 2016-02-21 LAB — TSH: TSH: 1.62 u[IU]/mL (ref 0.35–4.50)

## 2016-02-25 ENCOUNTER — Ambulatory Visit (INDEPENDENT_AMBULATORY_CARE_PROVIDER_SITE_OTHER): Payer: Federal, State, Local not specified - PPO | Admitting: Internal Medicine

## 2016-02-25 ENCOUNTER — Encounter: Payer: Self-pay | Admitting: Internal Medicine

## 2016-02-25 VITALS — BP 140/70 | HR 85 | Temp 98.0°F | Ht 75.0 in | Wt 296.0 lb

## 2016-02-25 DIAGNOSIS — Z0000 Encounter for general adult medical examination without abnormal findings: Secondary | ICD-10-CM

## 2016-02-25 DIAGNOSIS — R1310 Dysphagia, unspecified: Secondary | ICD-10-CM | POA: Insufficient documentation

## 2016-02-25 DIAGNOSIS — E785 Hyperlipidemia, unspecified: Secondary | ICD-10-CM | POA: Diagnosis not present

## 2016-02-25 DIAGNOSIS — E119 Type 2 diabetes mellitus without complications: Secondary | ICD-10-CM | POA: Diagnosis not present

## 2016-02-25 DIAGNOSIS — Z23 Encounter for immunization: Secondary | ICD-10-CM

## 2016-02-25 DIAGNOSIS — I10 Essential (primary) hypertension: Secondary | ICD-10-CM | POA: Diagnosis not present

## 2016-02-25 MED ORDER — ATORVASTATIN CALCIUM 10 MG PO TABS: 10.0000 mg | ORAL_TABLET | Freq: Every day | ORAL | Status: DC

## 2016-02-25 MED ORDER — AMLODIPINE BESYLATE 10 MG PO TABS: 10.0000 mg | ORAL_TABLET | Freq: Every day | ORAL | Status: DC

## 2016-02-25 MED ORDER — LOSARTAN POTASSIUM 50 MG PO TABS: 50.0000 mg | ORAL_TABLET | Freq: Every day | ORAL | Status: DC

## 2016-02-25 NOTE — Assessment & Plan Note (Signed)
stable overall by history and exam, recent data reviewed with pt, and pt to continue medical treatment as before,  to f/u any worsening symptoms or concerns BP Readings from Last 3 Encounters:  02/25/16 140/70  02/20/15 138/90  12/14/13 132/80

## 2016-02-25 NOTE — Progress Notes (Signed)
Pre visit review using our clinic review tool, if applicable. No additional management support is needed unless otherwise documented below in the visit note.   Request for a MBSS swallow study by qualified speek - Language Pathologist.  Off statin drugs  Possible dysphagia

## 2016-02-25 NOTE — Patient Instructions (Addendum)
You had the new Prevnar pneumonia shot today  Please continue all other medications as before, and refills have been done if requested.  Please have the pharmacy call with any other refills you may need.  Please continue your efforts at being more active, low cholesterol diet, and weight control.  You are otherwise up to date with prevention measures today.  Please keep your appointments with your specialists as you may have planned  You will be contacted regarding the referral for: Gastroenterology  Please return in 6 months, or sooner if needed, with Lab testing done 3-5 days before

## 2016-02-25 NOTE — Progress Notes (Signed)
Subjective:    Patient ID: Danny [name], male    DOB: May 17, 1951, 65 y.o.   MRN: [id number]  HPI Here for wellness and f/u;  Overall doing ok;  Pt denies Chest pain, worsening SOB, DOE, wheezing, orthopnea, PND, worsening LE edema, palpitations, dizziness or syncope.  Pt denies neurological change such as new headache, facial or extremity weakness.  Pt denies polydipsia, polyuria, or low sugar symptoms. Pt states overall good compliance with treatment and medications, good tolerability, and has been trying to follow appropriate diet.  Pt denies worsening depressive symptoms, suicidal ideation or panic. No fever, night sweats, wt loss, loss of appetite, or other constitutional symptoms.  Pt states good ability with ADL's, has low fall risk, home safety reviewed and adequate, no other significant changes in hearing or vision, and only occasionally active with exercise.  Does have difficulty with swallowing with certain foods and too fast, has some discomfort, had to stand to help it go down it seemed. Daughter is a Doctor, general practicespeech pathologist, suggested MBSS, and even getting off a statin that she has seen that has caused this in other patients. . Denies worsening reflux, abd pain, n/v, bowel change or blood.  Past Medical History  Diagnosis Date  . DIVERTICULOSIS, COLON 07/05/2008  . DM w/o Complication Type II 06/26/2008  . HYPERLIPIDEMIA 06/27/2008  . HYPERTENSION 06/27/2008  . OBSTRUCTIVE SLEEP APNEA 06/26/2008  . Allergic rhinitis, cause unspecified 03/12/2012  . Diabetes (HCC) 06/26/2008    Centricity Description: DM Qualifier: Diagnosis of  By: [name] MD, [name]  Centricity Description: DIABETES MELLITUS, TYPE II Qualifier: Diagnosis of  By: [name] MD, [name]    Past Surgical History  Procedure Laterality Date  . Tonsillectomy    . Nose surgery  1970    reports that he has never smoked. He does not have any smokeless tobacco history on file. He reports that he drinks alcohol. His drug history is not on  file. family history is not on file. Allergies  Allergen Reactions  . Penicillins    Current Outpatient Prescriptions on File Prior to Visit  Medication Sig Dispense Refill  . amLODipine (NORVASC) 10 MG tablet Take 1 tablet (10 mg total) by mouth daily. 90 tablet 3  . aspirin 325 MG tablet Take 325 mg by mouth daily.      . Omega-3 Fatty Acids (FISH OIL) 500 MG CAPS Take by mouth 2 (two) times daily.      [name]. atorvastatin (LIPITOR) 10 MG tablet Take 1 tablet (10 mg total) by mouth daily. 90 tablet 3  . hydrOXYzine (ATARAX/VISTARIL) 25 MG tablet Take 1 tablet (25 mg total) by mouth every 8 (eight) hours as needed for itching. (Patient not taking: Reported on 02/25/2016) 20 tablet 0  . losartan (COZAAR) 50 MG tablet Take 1 tablet (50 mg total) by mouth daily. 90 tablet 3  . [DISCONTINUED] lisinopril (PRINIVIL,ZESTRIL) 20 MG tablet Take 1 tablet (20 mg total) by mouth daily. 90 tablet 3   No current facility-administered medications on file prior to visit.   Review of Systems Constitutional: Negative for increased diaphoresis, other activity, appetite or siginficant weight change other than noted HENT: Negative for worsening hearing loss, ear pain, facial swelling, mouth sores and neck stiffness.   Eyes: Negative for other worsening pain, redness or visual disturbance.  Respiratory: Negative for shortness of breath and wheezing  Cardiovascular: Negative for chest pain and palpitations.  Gastrointestinal: Negative for diarrhea, blood in stool, abdominal distention or other pain Genitourinary:  Negative for hematuria, flank pain or change in urine volume.  Musculoskeletal: Negative for myalgias or other joint complaints.  Skin: Negative for color change and wound or drainage.  Neurological: Negative for syncope and numbness. other than noted Hematological: Negative for adenopathy. or other swelling Psychiatric/Behavioral: Negative for hallucinations, SI, self-injury, decreased concentration or  other worsening agitation.      Objective:   Physical Exam BP 140/70 mmHg  Pulse 85  Temp(Src) 98 F (36.7 C) (Oral)  Ht  (1.905 m)  Wt 296 lb (134.265 kg)  BMI 37.00 kg/m2  SpO2 97% VS noted,  Constitutional: Pt is oriented to person, place, and time. Appears well-developed and well-nourished, in no significant distress Head: Normocephalic and atraumatic.  Right Ear: External ear normal.  Left Ear: External ear normal.  Nose: Nose normal.  Mouth/Throat: Oropharynx is clear and moist.  Eyes: Conjunctivae and EOM are normal. Pupils are equal, round, and reactive to light.  Neck: Normal range of motion. Neck supple. No JVD present. No tracheal deviation present or significant neck LA or mass Cardiovascular: Normal rate, regular rhythm, normal heart sounds and intact distal pulses.   Pulmonary/Chest: Effort normal and breath sounds without rales or wheezing  Abdominal: Soft. Bowel sounds are normal. NT. No HSM  Musculoskeletal: Normal range of motion. Exhibits no edema.  Lymphadenopathy:  Has no cervical adenopathy.  Neurological: Pt is alert and oriented to person, place, and time. Pt has normal reflexes. No cranial nerve deficit. Motor grossly intact Skin: Skin is warm and dry. No rash noted.  Psychiatric:  Has normal mood and affect. Behavior is normal.     Assessment & Plan:

## 2016-02-25 NOTE — Assessment & Plan Note (Signed)

## 2016-02-25 NOTE — Assessment & Plan Note (Signed)
Seems functional abnormality in origin, but will need further evaluation to r/o mechanical issue - for GI referral, as may need EGD in addition to colonscopy to which he is also due

## 2016-02-25 NOTE — Assessment & Plan Note (Signed)
stable overall by history and exam, recent data reviewed with pt, and pt to continue medical treatment as before,  to f/u any worsening symptoms or concerns Lab Results  Component Value Date   HGBA1C 6.8* 02/21/2016

## 2016-02-25 NOTE — Assessment & Plan Note (Signed)
stable overall by history and exam, recent data reviewed with pt, and pt to continue medical treatment as before,  to f/u any worsening symptoms or concerns Lab Results  Component Value Date   LDLCALC 39 02/21/2016

## 2016-02-26 ENCOUNTER — Encounter: Payer: Self-pay | Admitting: Internal Medicine

## 2016-04-03 ENCOUNTER — Other Ambulatory Visit: Payer: Self-pay | Admitting: Internal Medicine

## 2016-04-17 ENCOUNTER — Ambulatory Visit (INDEPENDENT_AMBULATORY_CARE_PROVIDER_SITE_OTHER): Payer: Federal, State, Local not specified - PPO | Admitting: Internal Medicine

## 2016-04-17 ENCOUNTER — Encounter: Payer: Self-pay | Admitting: Internal Medicine

## 2016-04-17 VITALS — BP 124/84 | HR 76 | Ht 74.5 in | Wt 298.5 lb

## 2016-04-17 DIAGNOSIS — Z1211 Encounter for screening for malignant neoplasm of colon: Secondary | ICD-10-CM | POA: Diagnosis not present

## 2016-04-17 DIAGNOSIS — R1310 Dysphagia, unspecified: Secondary | ICD-10-CM

## 2016-04-17 NOTE — Patient Instructions (Signed)
  You have been scheduled for an endoscopy and colonoscopy. Please follow the written instructions given to you at your visit today. Please pick up your prep supplies at the pharmacy. If you use inhalers (even only as needed), please bring them with you on the day of your procedure.   I appreciate the opportunity to care for you.  

## 2016-04-17 NOTE — Progress Notes (Signed)
  Referred by: Corwin LevinsJohn, [name], MD  Subjective:    Patient ID: Danny Sidleobert M Tignor, male    DOB: May 04, 1951, 65 y.o.   MRN: [id number] Chief complaint: Swallowing problems and need for colon cancer screening HPI The patient is a pleasant middle-aged white man here with complaints of intermittent solid food dysphagia over years. No unintentional weight loss. No clear heartburn. Solid foods such as beef chicken and bread R culprits. He may have to regurgitate the food. The first time it ever happened was in 2011 when he was eating a hamburger. His daughter is a Doctor, general practicespeech pathologist and she has urged him to get evaluation. He is also due for a ten-year follow-up of screening colonoscopy and wishes to schedule that. GI review of systems is otherwise negative.Medications, allergies, past medical history, past surgical history, family history and social history are reviewed and updated in the EMR.    Review of Systems As per history of present illness. Seasonal allergies. Some intermittent insomnia. Otherwise all review of systems is negative.    Objective:   Physical Exam @BP  142/90 mmHg  Pulse 76  Ht 6' 2.5" (1.892 m)  Wt 298 lb 8 oz (135.399 kg)  BMI 37.82 kg/m2@  General:  Well-developed, well-nourished and in no acute distress - obese Eyes:  anicteric. ENT:   Mouth and posterior pharynx free of lesions. Good dentition. Neck:   supple w/o thyromegaly or mass.  Lungs: Clear to auscultation bilaterally. Heart:  S1S2, no rubs, murmurs, gallops. Abdomen:  obese soft, non-tender, no hepatosplenomegaly, hernia, or mass and BS+.  Rectal: Deferred until colonoscopy Lymph:  no cervical or supraclavicular adenopathy. Extremities:   cyanosis or clubbing Skin   no rash. Neuro:  A&O x 3.  Psych:  appropriate mood and  Affect.      Assessment & Plan:   1. Dysphagia   2. Colon cancer screening    Dysphagia it sounds like a Schatzki ring or perhaps other peptic stricture. Motility disorder and cancer  much less likely. Upper endoscopy with likely esophageal dilation is recommended.The risks and benefits as well as alternatives of endoscopic procedure(s) have been discussed and reviewed. All questions answered. The patient agrees to proceed.   [place] colonoscopy is also offered and he accepts.  I appreciate the opportunity to care for this patient.  CC: Oliver BarreJames [name], MD

## 2016-04-17 NOTE — Assessment & Plan Note (Signed)
EGD/dilation Suspect ring or peptic stricture The risks and benefits as well as alternatives of endoscopic procedure(s) have been discussed and reviewed. All questions answered. The patient agrees to proceed.

## 2016-04-18 ENCOUNTER — Encounter: Payer: Self-pay | Admitting: Internal Medicine

## 2016-05-06 ENCOUNTER — Encounter: Payer: Self-pay | Admitting: Internal Medicine

## 2016-05-09 ENCOUNTER — Encounter: Payer: Self-pay | Admitting: Internal Medicine

## 2016-05-16 ENCOUNTER — Other Ambulatory Visit: Payer: Self-pay | Admitting: Internal Medicine

## 2016-05-16 ENCOUNTER — Ambulatory Visit (AMBULATORY_SURGERY_CENTER): Payer: Federal, State, Local not specified - PPO | Admitting: Internal Medicine

## 2016-05-16 ENCOUNTER — Encounter: Payer: Self-pay | Admitting: Internal Medicine

## 2016-05-16 VITALS — BP 131/72 | HR 66 | Temp 96.6°F | Resp 12 | Ht 74.5 in | Wt 298.0 lb

## 2016-05-16 DIAGNOSIS — K222 Esophageal obstruction: Secondary | ICD-10-CM

## 2016-05-16 DIAGNOSIS — Z1211 Encounter for screening for malignant neoplasm of colon: Secondary | ICD-10-CM | POA: Diagnosis not present

## 2016-05-16 DIAGNOSIS — R1310 Dysphagia, unspecified: Secondary | ICD-10-CM | POA: Diagnosis not present

## 2016-05-16 DIAGNOSIS — K209 Esophagitis, unspecified: Secondary | ICD-10-CM | POA: Diagnosis not present

## 2016-05-16 DIAGNOSIS — R1319 Other dysphagia: Secondary | ICD-10-CM | POA: Diagnosis not present

## 2016-05-16 DIAGNOSIS — K2210 Ulcer of esophagus without bleeding: Secondary | ICD-10-CM

## 2016-05-16 DIAGNOSIS — K208 Other esophagitis: Secondary | ICD-10-CM | POA: Diagnosis not present

## 2016-05-16 MED ORDER — PANTOPRAZOLE SODIUM 40 MG PO TBEC: 40.0000 mg | DELAYED_RELEASE_TABLET | Freq: Every day | ORAL | Status: DC

## 2016-05-16 MED ORDER — SODIUM CHLORIDE 0.9 % IV SOLN: 500.0000 mL | INTRAVENOUS | Status: DC

## 2016-05-16 NOTE — Op Note (Signed)
[facility] Patient Name: Danny [name] Procedure Date: 05/16/2016 4:03 PM MRN: [id number] Endoscopist: Iva Boop , MD Age: 65 Referring MD:  Date of Birth: 16-May-1951 Gender: Male Procedure:                Colonoscopy Indications:              Screening for colorectal malignant neoplasm Medicines:                Propofol per Anesthesia, Monitored Anesthesia Care Procedure:                Pre-Anesthesia Assessment:                           - Prior to the procedure, a History and Physical                            was performed, and patient medications and                            allergies were reviewed. The patient's tolerance of                            previous anesthesia was also reviewed. The risks                            and benefits of the procedure and the sedation                            options and risks were discussed with the patient.                            All questions were answered, and informed consent                            was obtained. Prior Anticoagulants: The patient has                            taken no previous anticoagulant or antiplatelet                            agents. ASA Grade Assessment: II - A patient with                            mild systemic disease. After reviewing the risks                            and benefits, the patient was deemed in                            satisfactory condition to undergo the procedure.                           After obtaining informed consent, the colonoscope  was passed under direct vision. Throughout the                            procedure, the patient's blood pressure, pulse, and                            oxygen saturations were monitored continuously. The                            Model CF-HQ190L [id number]) scope was introduced                            through the anus and advanced to the the cecum,                            identified by  appendiceal orifice and ileocecal                            valve. The ileocecal valve, appendiceal orifice,                            and rectum were photographed. The quality of the                            bowel preparation was adequate. The bowel                            preparation used was Miralax. Scope In: 4:28:51 PM Scope Out: 4:41:24 PM Scope Withdrawal Time: 0 hours 9 minutes 57 seconds  Total Procedure Duration: 0 hours 12 minutes 33 seconds  Findings:                 The perianal and digital rectal examinations were                            normal. Pertinent negatives include normal prostate                            (size, shape, and consistency).                           The entire examined colon appeared normal.                           The retroflexed view of the distal rectum and anal                            verge was normal and showed no anal or rectal                            abnormalities. Complications:            No immediate complications. Estimated blood loss:  None. Estimated Blood Loss:     Estimated blood loss: none. Impression:               - The entire examined colon is normal.                           - The distal rectum and anal verge are normal on                            retroflexion view.                           - No specimens collected. Recommendation:           - Repeat colonoscopy in 10 years for screening                            purposes.                           - Resume previous diet.                           - Continue present medications. Iva Boop, MD 05/16/2016 4:59:00 PM This report has been signed electronically.

## 2016-05-16 NOTE — Op Note (Signed)
[facility] Patient Name: Danny MatesRobert [name] Procedure Date: 05/16/2016 4:04 PM MRN: [id number] Endoscopist: Iva Booparl E Kohlton Gilpatrick , MD Age: [age] Referring MD:  Date of Birth: 21-Nov-1951 Gender: Male Procedure:                Upper GI endoscopy Indications:              Dysphagia Medicines:                Propofol per Anesthesia, Monitored Anesthesia Care Procedure:                Pre-Anesthesia Assessment:                           - Prior to the procedure, a History and Physical                            was performed, and patient medications and                            allergies were reviewed. The patient's tolerance of                            previous anesthesia was also reviewed. The risks                            and benefits of the procedure and the sedation                            options and risks were discussed with the patient.                            All questions were answered, and informed consent                            was obtained. Prior Anticoagulants: The patient has                            taken no previous anticoagulant or antiplatelet                            agents. ASA Grade Assessment: II - A patient with                            mild systemic disease. After reviewing the risks                            and benefits, the patient was deemed in                            satisfactory condition to undergo the procedure.                           - Prior to the procedure, a History and Physical  was performed, and patient medications and                            allergies were reviewed. The patient's tolerance of                            previous anesthesia was also reviewed. The risks                            and benefits of the procedure and the sedation                            options and risks were discussed with the patient.                            All questions were answered, and informed consent                            was obtained. Prior Anticoagulants: The patient has                            taken no previous anticoagulant or antiplatelet                            agents. ASA Grade Assessment: II - A patient with                            mild systemic disease. After reviewing the risks                            and benefits, the patient was deemed in                            satisfactory condition to undergo the procedure.                           After obtaining informed consent, the endoscope was                            passed under direct vision. Throughout the                            procedure, the patient's blood pressure, pulse, and                            oxygen saturations were monitored continuously. The                            Model GIF-HQ190 [id number]) scope was introduced                            through the mouth, and advanced to the second part  of duodenum. The upper GI endoscopy was                            accomplished without difficulty. The patient                            tolerated the procedure well. The upper GI                            endoscopy was accomplished without difficulty. The                            patient tolerated the procedure well. Scope In: Scope Out: Findings:                 One moderate benign-appearing, intrinsic stenosis                            was found at the gastroesophageal junction. And was                            traversed. A TTS dilator was passed through the                            scope. Dilation with an 18-19-20 mm balloon dilator                            was performed to 18 mm. The dilation site was                            examined and showed moderate improvement in luminal                            narrowing. Estimated blood loss was minimal.                           LA Grade C (one or more mucosal breaks continuous                             between tops of 2 or more mucosal folds, less than                            75% circumference) esophagitis with no bleeding was                            found in the distal esophagus. Biopsies were taken                            with a cold forceps for histology. Verification of                            patient identification for the specimen was done.  Estimated blood loss was minimal.                           A non-bleeding diverticulum with a small opening                            and no stigmata of recent bleeding was found at the                            gastroesophageal junction.                           The exam was otherwise without abnormality.                           The cardia and gastric fundus were normal on                            retroflexion. Complications:            No immediate complications. Estimated Blood Loss:     Estimated blood loss was minimal. Impression:               - Benign-appearing esophageal stenosis. Dilated.                           - LA Grade C reflux esophagitis. Biopsied.                           - Diverticulum at the gastroesophageal junction.                           - The examination was otherwise normal. Recommendation:           - Patient has a contact number available for                            emergencies. The signs and symptoms of potential                            delayed complications were discussed with the                            patient. Return to normal activities tomorrow.                            Written discharge instructions were provided to the                            patient.                           - Clear liquids x 1 hour then soft foods rest of                            day. Start prior diet tomorrow.                           -  Continue present medications.                           - Await pathology results.                           - Repeat upper endoscopy in  4 months to evaluate                            the response to therapy.                           - Return to GI clinic in 2 weeks.                           - Use Protonix (pantoprazole) 40 mg PO daily                            indefinitely. Iva Boop, MD 05/16/2016 5:04:50 PM This report has been signed electronically.

## 2016-05-16 NOTE — Progress Notes (Signed)
Called to room to assist during endoscopic procedure.  Patient ID and intended procedure confirmed with present staff. Received instructions for my participation in the procedure from the performing physician.  

## 2016-05-16 NOTE — Patient Instructions (Addendum)
I found inflammation in the esophagus that I think is from reflux. I took biopsies. There was narrowing in the esophagus also - stricture - I dilated it.  Please start pantoprazole 40 mg daily before breakfast - it will treat this problem.  I think you will need a repeat exam later this year to make sure it is all healed.  No polyps or cancer on the colonoscopy.  Next routine colonoscopy in 10 years - 2027  I appreciate the opportunity to care for you. Iva Booparl E. Gessner, MD, FACG YOU HAD AN ENDOSCOPIC PROCEDURE TODAY AT THE [facility]:   Refer to the procedure report that was given to you for any specific questions about what was found during the examination.  If the procedure report does not answer your questions, please call your gastroenterologist to clarify.  If you requested that your care partner not be given the details of your procedure findings, then the procedure report has been included in a sealed envelope for you to review at your convenience later.  YOU SHOULD EXPECT: Some feelings of bloating in the abdomen. Passage of more gas than usual.  Walking can help get rid of the air that was put into your GI tract during the procedure and reduce the bloating. If you had a lower endoscopy (such as a colonoscopy or flexible sigmoidoscopy) you may notice spotting of blood in your stool or on the toilet paper. If you underwent a bowel prep for your procedure, you may not have a normal bowel movement for a few days.  Please Note:  You might notice some irritation and congestion in your nose or some drainage.  This is from the oxygen used during your procedure.  There is no need for concern and it should clear up in a day or so.  SYMPTOMS TO REPORT IMMEDIATELY:   Following lower endoscopy (colonoscopy or flexible sigmoidoscopy):  Excessive amounts of blood in the stool  Significant tenderness or worsening of abdominal pains  Swelling of the abdomen that is new,  acute  Fever of 100F or higher   Following upper endoscopy (EGD)  Vomiting of blood or coffee ground material  New chest pain or pain under the shoulder blades  Painful or persistently difficult swallowing  New shortness of breath  Fever of 100F or higher  Black, tarry-looking stools  For urgent or emergent issues, a gastroenterologist can be reached at any hour by calling [phone].   DIET: Your first meal following the procedure should be a small meal and then it is ok to progress to your normal diet. Heavy or fried foods are harder to digest and may make you feel nauseous or bloated.  Likewise, meals heavy in dairy and vegetables can increase bloating.  Drink plenty of fluids but you should avoid alcoholic beverages for 24 hours.  ACTIVITY:  You should plan to take it easy for the rest of today and you should NOT DRIVE or use heavy machinery until tomorrow (because of the sedation medicines used during the test).    FOLLOW UP: Our staff will call the number listed on your records the next business day following your procedure to check on you and address any questions or concerns that you may have regarding the information given to you following your procedure. If we do not reach you, we will leave a message.  However, if you are feeling well and you are not experiencing any problems, there is no need to return our call.  We will assume that you have returned to your regular daily activities without incident.  If any biopsies were taken you will be contacted by phone or by letter within the next 1-3 weeks.  Please call us at [phone] if you have not heard about the biopsies in 3 weeks.    SIGNATURES/CONFIDENTIALITY: You and/or your care partner have signed paperwork which will be entered into your electronic medical record.  These signatures attest to the fact that that the information above on your After Visit Summary has been reviewed and is understood.  Full responsibility  of the confidentiality of this discharge information lies with you and/or your care-partner.   Esophagitis and stricture information given.  Post dilation diet given with instructions.

## 2016-05-16 NOTE — Progress Notes (Signed)
Report to PACU, RN, vss, BBS= Clear.  

## 2016-05-20 ENCOUNTER — Telehealth: Payer: Self-pay

## 2016-05-20 NOTE — Telephone Encounter (Signed)
Left a message for the pt to call us back if any questions or concerns at #[phone]. maw

## 2016-05-26 NOTE — Progress Notes (Signed)
Quick Note:  Biopsies show inflammation No cancer Needs return visit about 2 months (report said 2 weeks - mistake) My Chart message to patient - no letter Place recall EGD for September please ______

## 2016-09-05 ENCOUNTER — Telehealth: Payer: Self-pay

## 2016-09-05 NOTE — Telephone Encounter (Signed)
Patient is due for EGD to confirm ulcer healing.   Left message for patient to call back

## 2016-09-08 NOTE — Telephone Encounter (Signed)
Patient contacted for symptom update and to schedule repeat EGD to confirm ulcer healing.  Patient reports "I"m doing great".  He declines to schedule repeat EGD.  He states he will call me back if he changes his mind.

## 2016-09-10 NOTE — Telephone Encounter (Signed)
Noted  

## 2016-10-07 ENCOUNTER — Telehealth: Payer: Self-pay | Admitting: Internal Medicine

## 2016-10-07 NOTE — Telephone Encounter (Signed)
Patient came into office today.  States he is very anxious and has an appointment with Behavioral Health on Thursday.  States Dr. [name] had tried him on xanax and he did not really want to go the route of medication but thinks he needs this medication.  He does not feel he can wait until Dr. [name] comes back tomorrow.  He would like to know if someone can prescribe him this medication.   He also would like to know if there is any kind of labs he can do to determine if he needs this medication or not.

## 2016-10-09 ENCOUNTER — Ambulatory Visit (INDEPENDENT_AMBULATORY_CARE_PROVIDER_SITE_OTHER): Payer: Federal, State, Local not specified - PPO | Admitting: Psychology

## 2016-10-09 DIAGNOSIS — F411 Generalized anxiety disorder: Secondary | ICD-10-CM

## 2016-10-27 ENCOUNTER — Ambulatory Visit (INDEPENDENT_AMBULATORY_CARE_PROVIDER_SITE_OTHER): Payer: Federal, State, Local not specified - PPO | Admitting: Psychology

## 2016-11-11 ENCOUNTER — Ambulatory Visit (INDEPENDENT_AMBULATORY_CARE_PROVIDER_SITE_OTHER): Payer: Federal, State, Local not specified - PPO | Admitting: Psychology

## 2016-11-11 DIAGNOSIS — F411 Generalized anxiety disorder: Secondary | ICD-10-CM

## 2016-11-12 DIAGNOSIS — F411 Generalized anxiety disorder: Secondary | ICD-10-CM | POA: Diagnosis not present

## 2016-12-08 ENCOUNTER — Ambulatory Visit: Payer: Self-pay

## 2017-03-11 ENCOUNTER — Other Ambulatory Visit: Payer: Self-pay | Admitting: Internal Medicine

## 2017-03-30 ENCOUNTER — Other Ambulatory Visit: Payer: Self-pay | Admitting: Emergency Medicine

## 2017-03-30 ENCOUNTER — Other Ambulatory Visit: Payer: Federal, State, Local not specified - PPO

## 2017-03-30 ENCOUNTER — Other Ambulatory Visit (INDEPENDENT_AMBULATORY_CARE_PROVIDER_SITE_OTHER): Payer: Federal, State, Local not specified - PPO

## 2017-03-30 DIAGNOSIS — E785 Hyperlipidemia, unspecified: Secondary | ICD-10-CM

## 2017-03-30 DIAGNOSIS — Z0000 Encounter for general adult medical examination without abnormal findings: Secondary | ICD-10-CM

## 2017-03-30 DIAGNOSIS — I10 Essential (primary) hypertension: Secondary | ICD-10-CM

## 2017-03-30 DIAGNOSIS — E138 Other specified diabetes mellitus with unspecified complications: Secondary | ICD-10-CM

## 2017-03-30 LAB — HEPATIC FUNCTION PANEL
ALT: 37 U/L (ref 0–53)
AST: 24 U/L (ref 0–37)
Albumin: 4.6 g/dL (ref 3.5–5.2)
Alkaline Phosphatase: 86 U/L (ref 39–117)
BILIRUBIN DIRECT: 0.2 mg/dL (ref 0.0–0.3)
BILIRUBIN TOTAL: 0.7 mg/dL (ref 0.2–1.2)
Total Protein: 7.2 g/dL (ref 6.0–8.3)

## 2017-03-30 LAB — URINALYSIS, ROUTINE W REFLEX MICROSCOPIC
Bilirubin Urine: NEGATIVE
Hgb urine dipstick: NEGATIVE
Ketones, ur: NEGATIVE
Leukocytes, UA: NEGATIVE
Nitrite: NEGATIVE
PH: 7 (ref 5.0–8.0)
RBC / HPF: NONE SEEN (ref 0–?)
Specific Gravity, Urine: <=1.005 — AB (ref 1.000–1.030)
TOTAL PROTEIN, URINE-UPE24: NEGATIVE
URINE GLUCOSE: NEGATIVE
UROBILINOGEN UA: 0.2 (ref 0.0–1.0)
WBC, UA: NONE SEEN (ref 0–?)

## 2017-03-30 LAB — HEMOGLOBIN A1C: HEMOGLOBIN A1C: 8 % — AB (ref 4.6–6.5)

## 2017-03-30 LAB — LIPID PANEL
CHOL/HDL RATIO: 3
Cholesterol: 116 mg/dL (ref 0–200)
HDL: 36.7 mg/dL — AB (ref >39.00)
LDL CALC: 60 mg/dL (ref 0–99)
NONHDL: 79.29
Triglycerides: 97 mg/dL (ref 0.0–149.0)
VLDL: 19.4 mg/dL (ref 0.0–40.0)

## 2017-03-30 LAB — HEPATITIS C ANTIBODY: HCV AB: NEGATIVE

## 2017-03-30 LAB — BASIC METABOLIC PANEL
BUN: 12 mg/dL (ref 6–23)
CHLORIDE: 103 meq/L (ref 96–112)
CO2: 28 meq/L (ref 19–32)
Calcium: 9.2 mg/dL (ref 8.4–10.5)
Creatinine, Ser: 0.95 mg/dL (ref 0.40–1.50)
GFR: 84.93 mL/min (ref >60.00)
GLUCOSE: 158 mg/dL — AB (ref 70–99)
Potassium: 4.2 mEq/L (ref 3.5–5.1)
Sodium: 138 mEq/L (ref 135–145)

## 2017-03-30 NOTE — Progress Notes (Unsigned)
Pt came in to have blood work done, labs expired, re-entered.

## 2017-03-31 ENCOUNTER — Encounter: Payer: Self-pay | Admitting: Internal Medicine

## 2017-03-31 ENCOUNTER — Ambulatory Visit (INDEPENDENT_AMBULATORY_CARE_PROVIDER_SITE_OTHER): Payer: Federal, State, Local not specified - PPO | Admitting: Internal Medicine

## 2017-03-31 ENCOUNTER — Other Ambulatory Visit (INDEPENDENT_AMBULATORY_CARE_PROVIDER_SITE_OTHER): Payer: Federal, State, Local not specified - PPO

## 2017-03-31 VITALS — BP 148/92 | HR 83 | Temp 98.4°F | Ht 75.0 in | Wt 311.0 lb

## 2017-03-31 DIAGNOSIS — I10 Essential (primary) hypertension: Secondary | ICD-10-CM | POA: Diagnosis not present

## 2017-03-31 DIAGNOSIS — Z23 Encounter for immunization: Secondary | ICD-10-CM

## 2017-03-31 DIAGNOSIS — K2210 Ulcer of esophagus without bleeding: Secondary | ICD-10-CM | POA: Diagnosis not present

## 2017-03-31 DIAGNOSIS — Z0000 Encounter for general adult medical examination without abnormal findings: Secondary | ICD-10-CM

## 2017-03-31 DIAGNOSIS — K222 Esophageal obstruction: Secondary | ICD-10-CM

## 2017-03-31 DIAGNOSIS — E119 Type 2 diabetes mellitus without complications: Secondary | ICD-10-CM

## 2017-03-31 LAB — HIV ANTIBODY (ROUTINE TESTING W REFLEX): HIV 1&2 Ab, 4th Generation: NONREACTIVE

## 2017-03-31 LAB — PSA: PSA: 0.84 ng/mL (ref 0.10–4.00)

## 2017-03-31 MED ORDER — METFORMIN HCL ER 500 MG PO TB24: 500.0000 mg | ORAL_TABLET | Freq: Every day | ORAL | 3 refills | Status: DC

## 2017-03-31 MED ORDER — LOSARTAN POTASSIUM 100 MG PO TABS: 100.0000 mg | ORAL_TABLET | Freq: Every day | ORAL | 3 refills | Status: DC

## 2017-03-31 MED ORDER — LORATADINE 10 MG PO TABS: 10.0000 mg | ORAL_TABLET | ORAL | 3 refills | Status: DC | PRN

## 2017-03-31 MED ORDER — ATORVASTATIN CALCIUM 10 MG PO TABS: 10.0000 mg | ORAL_TABLET | Freq: Every day | ORAL | 3 refills | Status: DC

## 2017-03-31 MED ORDER — PANTOPRAZOLE SODIUM 40 MG PO TBEC: 40.0000 mg | DELAYED_RELEASE_TABLET | Freq: Every day | ORAL | 3 refills | Status: DC

## 2017-03-31 MED ORDER — AMLODIPINE BESYLATE 10 MG PO TABS: 10.0000 mg | ORAL_TABLET | Freq: Every day | ORAL | 3 refills | Status: DC

## 2017-03-31 NOTE — Progress Notes (Addendum)
Subjective:    Patient ID: Danny [name], male    DOB: 1951/01/02, 66 y.o.   MRN: [id number]  HPI  Here for wellness and f/u;  Overall doing ok;  Pt denies Chest pain, worsening SOB, DOE, wheezing, orthopnea, PND, worsening LE edema, palpitations, dizziness or syncope.  Pt denies neurological change such as new headache, facial or extremity weakness. Pt states overall good compliance with treatment and medications, good tolerability, and has been trying to follow appropriate diet.  Pt denies worsening depressive symptoms, suicidal ideation or panic. No fever, night sweats, wt loss, loss of appetite, or other constitutional symptoms.  Pt states good ability with ADL's, has low fall risk, home safety reviewed and adequate, no other significant changes in hearing or vision, and not active with exercise.  Has gained several lbs. Wt Readings from Last 3 Encounters:  03/31/17 (!) 311 lb (141.1 kg)  05/16/16 298 lb (135.2 kg)  04/17/16 298 lb 8 oz (135.4 kg)  Not working as left his old job, and currently looking.  Has been more depressed due to the situation, but Denies worsening no suicidal ideation, or panic.  Has been to [facility] counseling that seemed to help.  Daughter new baby has kept him busy too.  Declines med such as ssri.  Last optho about 3 yrs.  Still taking asa 325 due to hx of TIA.  Pt denies polydipsia, polyuria, or low sugar symptoms such as weakness or confusion improved with po intake.  Pt states overall good compliance with meds, trying to follow lower cholesterol, diabetic diet, wt overall stable but little exercise however.   Willing to take the metformin and incresaed activity, but not wanitng to check sugars or DM education for now.   Past Medical History:  Diagnosis Date  . Allergic rhinitis, cause unspecified 03/12/2012  . Allergy    SEASONAL  . Anxiety   . Diabetes (HCC) 06/26/2008   Centricity Description: DM Qualifier: Diagnosis of  By: Jonny Ruiz MD, Len Blalock  Centricity  Description: DIABETES MELLITUS, TYPE II Qualifier: Diagnosis of  By: Jonny Ruiz MD, Len Blalock   . DIVERTICULOSIS, COLON 07/05/2008  . DM w/o Complication Type II 06/26/2008  . HYPERLIPIDEMIA 06/27/2008  . HYPERTENSION 06/27/2008  . Kidney stones   . Mini stroke (HCC)   . OBSTRUCTIVE SLEEP APNEA 06/26/2008  . Sleep apnea    CPAP  . Stroke [facility])    MINI   Past Surgical History:  Procedure Laterality Date  . COLONOSCOPY    . COSMETIC SURGERY    . NOSE SURGERY  1970   cosmetic  . TONSILLECTOMY      reports that he has never smoked. He has never used smokeless tobacco. He reports that he drinks alcohol. He reports that he does not use drugs. family history includes Cancer in his maternal grandfather and maternal grandmother; Emphysema in his paternal uncle; Heart disease in his father. Allergies  Allergen Reactions  . Penicillins    Current Outpatient Prescriptions on File Prior to Visit  Medication Sig Dispense Refill  . aspirin 325 MG tablet Take 325 mg by mouth daily.      . Omega-3 Fatty Acids (FISH OIL) 500 MG CAPS Take by mouth 2 (two) times daily.      . [DISCONTINUED] lisinopril (PRINIVIL,ZESTRIL) 20 MG tablet Take 1 tablet (20 mg total) by mouth daily. 90 tablet 3   No current facility-administered medications on file prior to visit.    Review of Systems Constitutional: Negative for other  unusual diaphoresis, sweats, appetite or weight changes HENT: Negative for other worsening hearing loss, ear pain, facial swelling, mouth sores or neck stiffness.   Eyes: Negative for other worsening pain, redness or other visual disturbance.  Respiratory: Negative for other stridor or swelling Cardiovascular: Negative for other palpitations or other chest pain  Gastrointestinal: Negative for worsening diarrhea or loose stools, blood in stool, distention or other pain Genitourinary: Negative for hematuria, flank pain or other change in urine volume.  Musculoskeletal: Negative for myalgias or other  joint swelling.  Skin: Negative for other color change, or other wound or worsening drainage.  Neurological: Negative for other syncope or numbness. Hematological: Negative for other adenopathy or swelling Psychiatric/Behavioral: Negative for hallucinations, other worsening agitation, SI, self-injury, or new decreased concentration All other system neg per pt    Objective:   Physical Exam  BP (!) 148/92   Pulse 83   Temp 98.4 F (36.9 C) (Oral)   Ht  (1.905 m)   Wt (!) 311 lb (141.1 kg)   SpO2 98%   BMI 38.87 kg/m  VS noted,  Constitutional: Pt is oriented to person, place, and time. Appears well-developed and well-nourished, in no significant distress and comfortable Head: Normocephalic and atraumatic  Eyes: Conjunctivae and EOM are normal. Pupils are equal, round, and reactive to light Right Ear: External ear normal without discharge Left Ear: External ear normal without discharge Nose: Nose without discharge or deformity Mouth/Throat: Oropharynx is without other ulcerations and moist  Neck: Normal range of motion. Neck supple. No JVD present. No tracheal deviation present or significant neck LA or mass Cardiovascular: Normal rate, regular rhythm, normal heart sounds and intact distal pulses.   Pulmonary/Chest: WOB normal and breath sounds without rales or wheezing  Abdominal: Soft. Bowel sounds are normal. NT. No HSM  Musculoskeletal: Normal range of motion. Exhibits no edema Lymphadenopathy: Has no other cervical adenopathy.  Neurological: Pt is alert and oriented to person, place, and time. Pt has normal reflexes. No cranial nerve deficit. Motor grossly intact, Gait intact Skin: Skin is warm and dry. No rash noted or new ulcerations Psychiatric:  Has normal mood and affect. Behavior is normal without agitation No other exam findings  ECG I have personally interpreted Sinus  Rhythm   Low voltage in precordial leads.   -Poor R-wave progression    Assessment & Plan:

## 2017-03-31 NOTE — Assessment & Plan Note (Signed)

## 2017-03-31 NOTE — Assessment & Plan Note (Signed)
Mild uncontrolled due to less activity and diet; for metformin ER 500 qd, declines check cbg and DM education for now

## 2017-03-31 NOTE — Assessment & Plan Note (Signed)
Mild uncontrolled, for increased losart 100 qd

## 2017-03-31 NOTE — Progress Notes (Signed)
Pre visit review using our clinic review tool, if applicable. No additional management support is needed unless otherwise documented below in the visit note. 

## 2017-03-31 NOTE — Patient Instructions (Addendum)
Please remember to call for your yearly eye doctor exam.    You had new Prevnar 13 pneumonia shot today  Please take all new medication as prescribed  - the metformin for sugar  OK to increase the losartan to 100 mg per day  Please continue all other medications as before, and refills have been done if requested.  Please have the pharmacy call with any other refills you may need.  Please continue your efforts at being more active, low cholesterol diet, and weight control.  You are otherwise up to date with prevention measures today.  Please keep your appointments with your specialists as you may have planned  Please go to the LAB in the Basement (turn left off the elevator) for the tests to be done today - just for the PSA.    You will be contacted by phone if any changes need to be made immediately.  Otherwise, you will receive a letter about your results with an explanation, but please check with MyChart first.  Please return in 6 months, or sooner if needed, with Lab testing done 3-5 days before

## 2017-03-31 NOTE — Addendum Note (Signed)
Addended by: Roney Mans on: 03/31/2017 10:06 AM   Modules accepted: Orders

## 2017-04-03 ENCOUNTER — Telehealth: Payer: Self-pay

## 2017-04-03 NOTE — Telephone Encounter (Signed)
PA was approved on 04/03/17  Key: [id number] Pt notified.

## 2017-04-12 ENCOUNTER — Other Ambulatory Visit: Payer: Self-pay | Admitting: Internal Medicine

## 2017-09-28 ENCOUNTER — Other Ambulatory Visit (INDEPENDENT_AMBULATORY_CARE_PROVIDER_SITE_OTHER): Payer: Federal, State, Local not specified - PPO

## 2017-09-28 DIAGNOSIS — E119 Type 2 diabetes mellitus without complications: Secondary | ICD-10-CM | POA: Diagnosis not present

## 2017-09-28 LAB — HEPATIC FUNCTION PANEL
ALBUMIN: 4.6 g/dL (ref 3.5–5.2)
ALK PHOS: 56 U/L (ref 39–117)
ALT: 22 U/L (ref 0–53)
AST: 21 U/L (ref 0–37)
BILIRUBIN DIRECT: 0.2 mg/dL (ref 0.0–0.3)
TOTAL PROTEIN: 7 g/dL (ref 6.0–8.3)
Total Bilirubin: 0.7 mg/dL (ref 0.2–1.2)

## 2017-09-28 LAB — BASIC METABOLIC PANEL
BUN: 15 mg/dL (ref 6–23)
CO2: 26 meq/L (ref 19–32)
Calcium: 9.3 mg/dL (ref 8.4–10.5)
Chloride: 105 mEq/L (ref 96–112)
Creatinine, Ser: 1.01 mg/dL (ref 0.40–1.50)
GFR: 79.01 mL/min (ref >60.00)
GLUCOSE: 121 mg/dL — AB (ref 70–99)
POTASSIUM: 4.6 meq/L (ref 3.5–5.1)
SODIUM: 140 meq/L (ref 135–145)

## 2017-09-28 LAB — LIPID PANEL
CHOLESTEROL: 108 mg/dL (ref 0–200)
HDL: 39.3 mg/dL (ref >39.00)
LDL Cholesterol: 57 mg/dL (ref 0–99)
NONHDL: 69.08
Total CHOL/HDL Ratio: 3
Triglycerides: 60 mg/dL (ref 0.0–149.0)
VLDL: 12 mg/dL (ref 0.0–40.0)

## 2017-09-28 LAB — HEMOGLOBIN A1C: HEMOGLOBIN A1C: 6.1 % (ref 4.6–6.5)

## 2017-09-30 ENCOUNTER — Encounter: Payer: Self-pay | Admitting: Internal Medicine

## 2017-09-30 ENCOUNTER — Ambulatory Visit (INDEPENDENT_AMBULATORY_CARE_PROVIDER_SITE_OTHER): Payer: Federal, State, Local not specified - PPO | Admitting: Internal Medicine

## 2017-09-30 VITALS — BP 144/88 | HR 85 | Temp 97.7°F | Ht 75.0 in | Wt 277.0 lb

## 2017-09-30 DIAGNOSIS — Z0000 Encounter for general adult medical examination without abnormal findings: Secondary | ICD-10-CM

## 2017-09-30 DIAGNOSIS — E785 Hyperlipidemia, unspecified: Secondary | ICD-10-CM

## 2017-09-30 DIAGNOSIS — Z23 Encounter for immunization: Secondary | ICD-10-CM

## 2017-09-30 DIAGNOSIS — E119 Type 2 diabetes mellitus without complications: Secondary | ICD-10-CM | POA: Diagnosis not present

## 2017-09-30 DIAGNOSIS — I10 Essential (primary) hypertension: Secondary | ICD-10-CM

## 2017-09-30 NOTE — Assessment & Plan Note (Signed)
stable overall by history and exam, recent data reviewed with pt, and pt to continue medical treatment as before,  to f/u any worsening symptoms or concerns BP Readings from Last 3 Encounters:  09/30/17 (!) 144/88  03/31/17 (!) 148/92  05/16/16 131/72

## 2017-09-30 NOTE — Progress Notes (Signed)
Subjective:    Patient ID: Danny [name], male    DOB: [birth year], 66 y.o.   MRN: [id number]  HPI  Here to f/u; overall doing ok,  Pt denies chest pain, increasing sob or doe, wheezing, orthopnea, PND, increased LE swelling, palpitations, dizziness or syncope.  Pt denies new neurological symptoms such as new headache, or facial or extremity weakness or numbness.  Pt denies polydipsia, polyuria, or low sugar episode.  Pt states overall good compliance with meds, mostly trying to follow appropriate diet, and in fact has become quite serious in his effort to reverse the findings of new DM; has done a remarkable job with wt loss of 35 lbs, increased activity and attention to DM diet  Near complete retired, walks 5 miles per day over 75 min.  Really working on diet, and exercise, and lost 35 lbs. BP at home < 140/90  Wt Readings from Last 3 Encounters:  09/30/17 277 lb (125.6 kg)  03/31/17 (!) 311 lb (141.1 kg)  05/16/16 298 lb (135.2 kg)  Plans to see eye doctor soon.  No other interval hx Past Medical History:  Diagnosis Date  . Allergic rhinitis, cause unspecified 03/12/2012  . Allergy    SEASONAL  . Anxiety   . Diabetes (HCC) 06/26/2008   Centricity Description: DM Qualifier: Diagnosis of  By: Jonny Ruiz MD, Len Blalock  Centricity Description: DIABETES MELLITUS, TYPE II Qualifier: Diagnosis of  By: Jonny Ruiz MD, Len Blalock   . DIVERTICULOSIS, COLON 07/05/2008  . DM w/o Complication Type II 06/26/2008  . HYPERLIPIDEMIA 06/27/2008  . HYPERTENSION 06/27/2008  . Kidney stones   . Mini stroke (HCC)   . OBSTRUCTIVE SLEEP APNEA 06/26/2008  . Sleep apnea    CPAP  . Stroke [facility])    MINI   Past Surgical History:  Procedure Laterality Date  . COLONOSCOPY    . COSMETIC SURGERY    . NOSE SURGERY  1970   cosmetic  . TONSILLECTOMY      reports that he has never smoked. He has never used smokeless tobacco. He reports that he drinks alcohol. He reports that he does not use drugs. family history includes Cancer in his  maternal grandfather and maternal grandmother; Emphysema in his paternal uncle; Heart disease in his father. Allergies  Allergen Reactions  . Penicillins    Current Outpatient Prescriptions on File Prior to Visit  Medication Sig Dispense Refill  . amLODipine (NORVASC) 10 MG tablet Take 1 tablet (10 mg total) by mouth daily. 90 tablet 3  . aspirin 325 MG tablet Take 325 mg by mouth daily.      [name] atorvastatin (LIPITOR) 10 MG tablet Take 1 tablet (10 mg total) by mouth daily. 90 tablet 3  . loratadine (CLARITIN) 10 MG tablet Take 1 tablet (10 mg total) by mouth as needed. 90 tablet 3  . losartan (COZAAR) 100 MG tablet Take 1 tablet (100 mg total) by mouth daily. 90 tablet 3  . metFORMIN (GLUCOPHAGE-XR) 500 MG 24 hr tablet Take 1 tablet (500 mg total) by mouth daily with breakfast. 90 tablet 3  . Omega-3 Fatty Acids (FISH OIL) 500 MG CAPS Take by mouth 2 (two) times daily.      . pantoprazole (PROTONIX) 40 MG tablet Take 1 tablet (40 mg total) by mouth daily before breakfast. 90 tablet 3  . [DISCONTINUED] lisinopril (PRINIVIL,ZESTRIL) 20 MG tablet Take 1 tablet (20 mg total) by mouth daily. 90 tablet 3   No current facility-administered medications on file prior  to visit.    Review of Systems  Constitutional: Negative for other unusual diaphoresis or sweats HENT: Negative for ear discharge or swelling Eyes: Negative for other worsening visual disturbances Respiratory: Negative for stridor or other swelling  Gastrointestinal: Negative for worsening distension or other blood Genitourinary: Negative for retention or other urinary change Musculoskeletal: Negative for other MSK pain or swelling Skin: Negative for color change or other new lesions Neurological: Negative for worsening tremors and other numbness  Psychiatric/Behavioral: Negative for worsening agitation or other fatigue All other system neg per pt    Objective:   Physical Exam BP (!) 144/88   Pulse 85   Temp 97.7 F (36.5 C)  (Oral)   Ht  (1.905 m)   Wt 277 lb (125.6 kg)   SpO2 99%   BMI 34.62 kg/m  VS noted,  Constitutional: Pt appears in NAD HENT: Head: NCAT.  Right Ear: External ear normal.  Left Ear: External ear normal.  Eyes: . Pupils are equal, round, and reactive to light. Conjunctivae and EOM are normal Nose: without d/c or deformity Neck: Neck supple. Gross normal ROM Cardiovascular: Normal rate and regular rhythm.   Pulmonary/Chest: Effort normal and breath sounds without rales or wheezing.  Abd:  Soft, NT, ND, + BS, no organomegaly Neurological: Pt is alert. At baseline orientation, motor grossly intact Skin: Skin is warm. No rashes, other new lesions, no LE edema Psychiatric: Pt behavior is normal without agitation  No other exam findings     Assessment & Plan:

## 2017-09-30 NOTE — Assessment & Plan Note (Signed)
stable overall by history and exam, recent data reviewed with pt, and pt to continue medical treatment as before,  to f/u any worsening symptoms or concerns Lab Results  Component Value Date   LDLCALC 57 09/28/2017

## 2017-09-30 NOTE — Assessment & Plan Note (Signed)
stable overall by history and exam, recent data reviewed with pt, and pt to continue medical treatment as before,  to f/u any worsening symptoms or concerns; with further wt loss may be able to d/c metformin next visit

## 2017-09-30 NOTE — Patient Instructions (Addendum)
You had the flu shot today  Please keep up the Good Work with exercise, diet and weight loss!  Please continue all other medications as before, and refills have been done if requested.  Please have the pharmacy call with any other refills you may need.  Please continue your efforts at being more active, low cholesterol diet, and weight control.  Please keep your appointments with your specialists as you may have planned  Please return in 6 months, or sooner if needed, with Lab testing done 3-5 days before

## 2018-03-19 ENCOUNTER — Other Ambulatory Visit: Payer: Self-pay | Admitting: Internal Medicine

## 2018-04-04 ENCOUNTER — Other Ambulatory Visit: Payer: Self-pay | Admitting: Internal Medicine

## 2018-04-06 ENCOUNTER — Ambulatory Visit (INDEPENDENT_AMBULATORY_CARE_PROVIDER_SITE_OTHER): Payer: Federal, State, Local not specified - PPO | Admitting: Internal Medicine

## 2018-04-06 ENCOUNTER — Other Ambulatory Visit (INDEPENDENT_AMBULATORY_CARE_PROVIDER_SITE_OTHER): Payer: Federal, State, Local not specified - PPO

## 2018-04-06 ENCOUNTER — Encounter: Payer: Self-pay | Admitting: Internal Medicine

## 2018-04-06 VITALS — BP 124/86 | HR 67 | Temp 97.7°F | Ht 75.0 in | Wt 295.0 lb

## 2018-04-06 DIAGNOSIS — E119 Type 2 diabetes mellitus without complications: Secondary | ICD-10-CM

## 2018-04-06 DIAGNOSIS — Z0000 Encounter for general adult medical examination without abnormal findings: Secondary | ICD-10-CM

## 2018-04-06 LAB — CBC WITH DIFFERENTIAL/PLATELET
BASOS ABS: 0 10*3/uL (ref 0.0–0.1)
Basophils Relative: 0.8 % (ref 0.0–3.0)
Eosinophils Absolute: 0.3 10*3/uL (ref 0.0–0.7)
Eosinophils Relative: 5.8 % — ABNORMAL HIGH (ref 0.0–5.0)
HEMATOCRIT: 43.9 % (ref 39.0–52.0)
HEMOGLOBIN: 14.7 g/dL (ref 13.0–17.0)
LYMPHS PCT: 26.3 % (ref 12.0–46.0)
Lymphs Abs: 1.3 10*3/uL (ref 0.7–4.0)
MCHC: 33.6 g/dL (ref 30.0–36.0)
MCV: 86.5 fl (ref 78.0–100.0)
Monocytes Absolute: 0.4 10*3/uL (ref 0.1–1.0)
Monocytes Relative: 8.2 % (ref 3.0–12.0)
Neutro Abs: 2.9 10*3/uL (ref 1.4–7.7)
Neutrophils Relative %: 58.9 % (ref 43.0–77.0)
Platelets: 171 10*3/uL (ref 150.0–400.0)
RBC: 5.08 Mil/uL (ref 4.22–5.81)
RDW: 13.4 % (ref 11.5–15.5)
WBC: 4.9 10*3/uL (ref 4.0–10.5)

## 2018-04-06 LAB — LIPID PANEL
CHOL/HDL RATIO: 3
Cholesterol: 126 mg/dL (ref 0–200)
HDL: 38.7 mg/dL — AB (ref >39.00)
LDL CALC: 73 mg/dL (ref 0–99)
NONHDL: 86.96
TRIGLYCERIDES: 71 mg/dL (ref 0.0–149.0)
VLDL: 14.2 mg/dL (ref 0.0–40.0)

## 2018-04-06 LAB — URINALYSIS, ROUTINE W REFLEX MICROSCOPIC
Bilirubin Urine: NEGATIVE
Hgb urine dipstick: NEGATIVE
KETONES UR: NEGATIVE
Leukocytes, UA: NEGATIVE
Nitrite: NEGATIVE
PH: 6.5 (ref 5.0–8.0)
RBC / HPF: NONE SEEN (ref 0–?)
SPECIFIC GRAVITY, URINE: 1.025 (ref 1.000–1.030)
Total Protein, Urine: NEGATIVE
Urine Glucose: NEGATIVE
Urobilinogen, UA: 0.2 (ref 0.0–1.0)

## 2018-04-06 LAB — BASIC METABOLIC PANEL
BUN: 17 mg/dL (ref 6–23)
CHLORIDE: 105 meq/L (ref 96–112)
CO2: 26 meq/L (ref 19–32)
Calcium: 9 mg/dL (ref 8.4–10.5)
Creatinine, Ser: 0.9 mg/dL (ref 0.40–1.50)
GFR: 90.1 mL/min (ref >60.00)
GLUCOSE: 139 mg/dL — AB (ref 70–99)
Potassium: 4 mEq/L (ref 3.5–5.1)
SODIUM: 140 meq/L (ref 135–145)

## 2018-04-06 LAB — HEPATIC FUNCTION PANEL
ALK PHOS: 69 U/L (ref 39–117)
ALT: 21 U/L (ref 0–53)
AST: 17 U/L (ref 0–37)
Albumin: 4.4 g/dL (ref 3.5–5.2)
Bilirubin, Direct: 0.1 mg/dL (ref 0.0–0.3)
TOTAL PROTEIN: 6.6 g/dL (ref 6.0–8.3)
Total Bilirubin: 0.7 mg/dL (ref 0.2–1.2)

## 2018-04-06 LAB — MICROALBUMIN / CREATININE URINE RATIO
CREATININE, U: 178.8 mg/dL
MICROALB/CREAT RATIO: 0.4 mg/g (ref 0.0–30.0)

## 2018-04-06 LAB — HEMOGLOBIN A1C: Hgb A1c MFr Bld: 6.2 % (ref 4.6–6.5)

## 2018-04-06 LAB — TSH: TSH: 2.24 u[IU]/mL (ref 0.35–4.50)

## 2018-04-06 LAB — PSA: PSA: 1.09 ng/mL (ref 0.10–4.00)

## 2018-04-06 NOTE — Progress Notes (Signed)
Subjective:    Patient ID: Danny [name], male    DOB: 1950/12/28, 67 y.o.   MRN: [id number]  HPI  Here for wellness and f/u;  Overall doing ok;  Pt denies Chest pain, worsening SOB, DOE, wheezing, orthopnea, PND, worsening LE edema, palpitations, dizziness or syncope.  Pt denies neurological change such as new headache, facial or extremity weakness.  Pt denies polydipsia, polyuria, or low sugar symptoms. Pt states overall good compliance with treatment and medications, good tolerability, and has been trying to follow appropriate diet.  Pt denies worsening depressive symptoms, suicidal ideation or panic. No fever, night sweats, wt loss, loss of appetite, or other constitutional symptoms.  Pt states good ability with ADL's, has low fall risk, home safety reviewed and adequate, no other significant changes in hearing or vision, and very only occasionally active with exercise. Plans to call for optho appt soon on his own.  Did have a foot rash and hands - ? Eczema.   No other new complaints or interval change.  Does have son and family living with him now and he is concerned about his measles immunization status. Past Medical History:  Diagnosis Date  . Allergic rhinitis, cause unspecified 03/12/2012  . Allergy    SEASONAL  . Anxiety   . Diabetes (HCC) 06/26/2008   Centricity Description: DM Qualifier: Diagnosis of  By: [name] MD, [name]  Centricity Description: DIABETES MELLITUS, TYPE II Qualifier: Diagnosis of  By: [name] MD, [name]   . DIVERTICULOSIS, COLON 07/05/2008  . DM w/o Complication Type II 06/26/2008  . HYPERLIPIDEMIA 06/27/2008  . HYPERTENSION 06/27/2008  . Kidney stones   . Mini stroke (HCC)   . OBSTRUCTIVE SLEEP APNEA 06/26/2008  . Sleep apnea    CPAP  . Stroke [facility](HCC)    MINI   Past Surgical History:  Procedure Laterality Date  . COLONOSCOPY    . COSMETIC SURGERY    . NOSE SURGERY  1970   cosmetic  . TONSILLECTOMY      reports that he has never smoked. He has never used smokeless  tobacco. He reports that he drinks alcohol. He reports that he does not use drugs. family history includes Cancer in his maternal grandfather and maternal grandmother; Emphysema in his paternal uncle; Heart disease in his father. Allergies  Allergen Reactions  . Penicillins    Current Outpatient Medications on File Prior to Visit  Medication Sig Dispense Refill  . amLODipine (NORVASC) 10 MG tablet TAKE 1 TABLET(10 MG) BY MOUTH DAILY 90 tablet 0  . aspirin 325 MG tablet Take 325 mg by mouth daily.      [name]. atorvastatin (LIPITOR) 10 MG tablet Take 1 tablet (10 mg total) by mouth daily. 90 tablet 3  . loratadine (CLARITIN) 10 MG tablet Take 1 tablet (10 mg total) by mouth as needed. 90 tablet 3  . losartan (COZAAR) 100 MG tablet TAKE 1 TABLET(100 MG) BY MOUTH DAILY 90 tablet 0  . metFORMIN (GLUCOPHAGE-XR) 500 MG 24 hr tablet TAKE 1 TABLET(500 MG) BY MOUTH DAILY WITH BREAKFAST 90 tablet 0  . Omega-3 Fatty Acids (FISH OIL) 500 MG CAPS Take by mouth 2 (two) times daily.      . pantoprazole (PROTONIX) 40 MG tablet Take 1 tablet (40 mg total) by mouth daily before breakfast. 90 tablet 3   No current facility-administered medications on file prior to visit.    Review of Systems Constitutional: Negative for other unusual diaphoresis, sweats, appetite or weight changes HENT: Negative  for other worsening hearing loss, ear pain, facial swelling, mouth sores or neck stiffness.   Eyes: Negative for other worsening pain, redness or other visual disturbance.  Respiratory: Negative for other stridor or swelling Cardiovascular: Negative for other palpitations or other chest pain  Gastrointestinal: Negative for worsening diarrhea or loose stools, blood in stool, distention or other pain Genitourinary: Negative for hematuria, flank pain or other change in urine volume.  Musculoskeletal: Negative for myalgias or other joint swelling.  Skin: Negative for other color change, or other wound or worsening drainage.    Neurological: Negative for other syncope or numbness. Hematological: Negative for other adenopathy or swelling Psychiatric/Behavioral: Negative for hallucinations, other worsening agitation, SI, self-injury, or new decreased concentration All other system neg per pt    Objective:   Physical Exam BP 124/86   Pulse 67   Temp 97.7 F (36.5 C) (Oral)   Ht 6\' 3"  (1.905 m)   Wt 295 lb (133.8 kg)   SpO2 96%   BMI 36.87 kg/m  VS noted,  Constitutional: Pt is oriented to person, place, and time. Appears well-developed and well-nourished, in no significant distress and comfortable Head: Normocephalic and atraumatic  Eyes: Conjunctivae and EOM are normal. Pupils are equal, round, and reactive to light Right Ear: External ear normal without discharge Left Ear: External ear normal without discharge Nose: Nose without discharge or deformity Mouth/Throat: Oropharynx is without other ulcerations and moist  Neck: Normal range of motion. Neck supple. No JVD present. No tracheal deviation present or significant neck LA or mass Cardiovascular: Normal rate, regular rhythm, normal heart sounds and intact distal pulses.   Pulmonary/Chest: WOB normal and breath sounds without rales or wheezing  Abdominal: Soft. Bowel sounds are normal. NT. No HSM  Musculoskeletal: Normal range of motion. Exhibits no edema Lymphadenopathy: Has no other cervical adenopathy.  Neurological: Pt is alert and oriented to person, place, and time. Pt has normal reflexes. No cranial nerve deficit. Motor grossly intact, Gait intact Skin: Skin is warm and dry. No rash noted or new ulcerations Psychiatric:  Has normal mood and affect. Behavior is normal without agitation No other exam findings Lab Results  Component Value Date   WBC 6.6 02/21/2016   HGB 14.4 02/21/2016   HCT 42.8 02/21/2016   PLT 199.0 02/21/2016   GLUCOSE 121 (H) 09/28/2017   CHOL 108 09/28/2017   TRIG 60.0 09/28/2017   HDL 39.30 09/28/2017   LDLCALC 57  09/28/2017   ALT 22 09/28/2017   AST 21 09/28/2017   NA 140 09/28/2017   K 4.6 09/28/2017   CL 105 09/28/2017   CREATININE 1.01 09/28/2017   BUN 15 09/28/2017   CO2 26 09/28/2017   TSH 1.62 02/21/2016   PSA 0.84 03/31/2017   HGBA1C 6.1 09/28/2017   MICROALBUR 0.3 12/14/2012          Assessment & Plan:

## 2018-04-06 NOTE — Patient Instructions (Signed)
Please continue all other medications as before, and refills have been done if requested.  Please have the pharmacy call with any other refills you may need.  Please continue your efforts at being more active, low cholesterol diet, and weight control.  You are otherwise up to date with prevention measures today.  Please keep your appointments with your specialists as you may have planned  Please return in 6 months, or sooner if needed, with Lab testing done 3-5 days before  

## 2018-04-09 NOTE — Assessment & Plan Note (Signed)
Lab Results  Component Value Date   HGBA1C 6.2 04/06/2018  stable overall by history and exam, recent data reviewed with pt, and pt to continue medical treatment as before,  to f/u any worsening symptoms or concerns

## 2018-04-09 NOTE — Assessment & Plan Note (Signed)

## 2018-06-16 ENCOUNTER — Other Ambulatory Visit: Payer: Self-pay | Admitting: Internal Medicine

## 2018-07-03 ENCOUNTER — Other Ambulatory Visit: Payer: Self-pay | Admitting: Internal Medicine

## 2018-10-06 ENCOUNTER — Other Ambulatory Visit (INDEPENDENT_AMBULATORY_CARE_PROVIDER_SITE_OTHER): Payer: Federal, State, Local not specified - PPO

## 2018-10-06 DIAGNOSIS — E119 Type 2 diabetes mellitus without complications: Secondary | ICD-10-CM

## 2018-10-06 LAB — LIPID PANEL
Cholesterol: 106 mg/dL (ref 0–200)
HDL: 39.8 mg/dL (ref >39.00)
LDL Cholesterol: 56 mg/dL (ref 0–99)
NONHDL: 66.23
Total CHOL/HDL Ratio: 3
Triglycerides: 50 mg/dL (ref 0.0–149.0)
VLDL: 10 mg/dL (ref 0.0–40.0)

## 2018-10-06 LAB — BASIC METABOLIC PANEL
BUN: 16 mg/dL (ref 6–23)
CALCIUM: 9.2 mg/dL (ref 8.4–10.5)
CO2: 28 mEq/L (ref 19–32)
Chloride: 103 mEq/L (ref 96–112)
Creatinine, Ser: 0.92 mg/dL (ref 0.40–1.50)
GFR: 87.71 mL/min (ref >60.00)
GLUCOSE: 139 mg/dL — AB (ref 70–99)
POTASSIUM: 4.4 meq/L (ref 3.5–5.1)
Sodium: 139 mEq/L (ref 135–145)

## 2018-10-06 LAB — HEPATIC FUNCTION PANEL
ALT: 14 U/L (ref 0–53)
AST: 11 U/L (ref 0–37)
Albumin: 4.5 g/dL (ref 3.5–5.2)
Alkaline Phosphatase: 67 U/L (ref 39–117)
BILIRUBIN DIRECT: 0.2 mg/dL (ref 0.0–0.3)
BILIRUBIN TOTAL: 0.7 mg/dL (ref 0.2–1.2)
Total Protein: 7.4 g/dL (ref 6.0–8.3)

## 2018-10-06 LAB — HEMOGLOBIN A1C: HEMOGLOBIN A1C: 6.2 % (ref 4.6–6.5)

## 2018-10-07 ENCOUNTER — Ambulatory Visit: Payer: Federal, State, Local not specified - PPO | Admitting: Internal Medicine

## 2018-10-07 ENCOUNTER — Encounter: Payer: Self-pay | Admitting: Internal Medicine

## 2018-10-07 VITALS — BP 126/84 | HR 77 | Temp 97.7°F | Ht 75.0 in | Wt 278.0 lb

## 2018-10-07 DIAGNOSIS — E785 Hyperlipidemia, unspecified: Secondary | ICD-10-CM

## 2018-10-07 DIAGNOSIS — I10 Essential (primary) hypertension: Secondary | ICD-10-CM | POA: Diagnosis not present

## 2018-10-07 DIAGNOSIS — E119 Type 2 diabetes mellitus without complications: Secondary | ICD-10-CM

## 2018-10-07 DIAGNOSIS — Z0000 Encounter for general adult medical examination without abnormal findings: Secondary | ICD-10-CM

## 2018-10-07 DIAGNOSIS — Z23 Encounter for immunization: Secondary | ICD-10-CM | POA: Diagnosis not present

## 2018-10-07 DIAGNOSIS — L309 Dermatitis, unspecified: Secondary | ICD-10-CM

## 2018-10-07 DIAGNOSIS — T23029A Burn of unspecified degree of unspecified single finger (nail) except thumb, initial encounter: Secondary | ICD-10-CM

## 2018-10-07 MED ORDER — SILVER SULFADIAZINE 1 % EX CREA: 1.0000 "application " | TOPICAL_CREAM | Freq: Every day | CUTANEOUS | 1 refills | Status: AC

## 2018-10-07 MED ORDER — METHYLPREDNISOLONE ACETATE 80 MG/ML IJ SUSP
80.0000 mg | Freq: Once | INTRAMUSCULAR | Status: AC
  Administered 2018-10-07: 80 mg via INTRAMUSCULAR

## 2018-10-07 NOTE — Assessment & Plan Note (Signed)
mod, for depomedrol IM 80, f/u with derm tomorrow,  to f/u any worsening symptoms or concerns

## 2018-10-07 NOTE — Progress Notes (Signed)
Subjective:    Patient ID: Danny [name], male    DOB: [birth year], 67 y.o.   MRN: [id number]  HPI  Here to f/u; overall doing ok,  Pt denies chest pain, increasing sob or doe, wheezing, orthopnea, PND, increased LE swelling, palpitations, dizziness or syncope.  Pt denies new neurological symptoms such as new headache, or facial or extremity weakness or numbness.  Pt denies polydipsia, polyuria, or low sugar episode.  Pt states overall good compliance with meds, mostly trying to follow appropriate diet, with wt overall stable,  but also has 4 wks onset rather marked itchy rash dermatitis similar to last fall when he saw derm with tx with topical steroid cream.   Pt denies fever, wt loss, night sweats, loss of appetite, or other constitutional symptoms  Also in an effort to quell the itching, he placed fingers both hands in hot water now with pain, swelling mild erythema in last few days.  Incidentally has appt for f/u with derm tomorrow Past Medical History:  Diagnosis Date  . Allergic rhinitis, cause unspecified 03/12/2012  . Allergy    SEASONAL  . Anxiety   . Diabetes (HCC) 06/26/2008   Centricity Description: DM Qualifier: Diagnosis of  By: Jonny Ruiz MD, Len Blalock  Centricity Description: DIABETES MELLITUS, TYPE II Qualifier: Diagnosis of  By: Jonny Ruiz MD, Len Blalock   . DIVERTICULOSIS, COLON 07/05/2008  . DM w/o Complication Type II 06/26/2008  . HYPERLIPIDEMIA 06/27/2008  . HYPERTENSION 06/27/2008  . Kidney stones   . Mini stroke (HCC)   . OBSTRUCTIVE SLEEP APNEA 06/26/2008  . Sleep apnea    CPAP  . Stroke [facility])    MINI   Past Surgical History:  Procedure Laterality Date  . COLONOSCOPY    . COSMETIC SURGERY    . NOSE SURGERY  1970   cosmetic  . TONSILLECTOMY      reports that he has never smoked. He has never used smokeless tobacco. He reports that he drinks alcohol. He reports that he does not use drugs. family history includes Cancer in his maternal grandfather and maternal grandmother; Emphysema  in his paternal uncle; Heart disease in his father. Allergies  Allergen Reactions  . Penicillins    Current Outpatient Medications on File Prior to Visit  Medication Sig Dispense Refill  . amLODipine (NORVASC) 10 MG tablet TAKE 1 TABLET(10 MG) BY MOUTH DAILY 90 tablet 1  . aspirin 325 MG tablet Take 325 mg by mouth daily.      [name] atorvastatin (LIPITOR) 10 MG tablet Take 1 tablet (10 mg total) by mouth daily. 90 tablet 3  . atorvastatin (LIPITOR) 10 MG tablet TAKE 1 TABLET BY MOUTH DAILY 90 tablet 1  . loratadine (CLARITIN) 10 MG tablet Take 1 tablet (10 mg total) by mouth as needed. 90 tablet 3  . losartan (COZAAR) 100 MG tablet TAKE 1 TABLET(100 MG) BY MOUTH DAILY 90 tablet 1  . metFORMIN (GLUCOPHAGE-XR) 500 MG 24 hr tablet TAKE 1 TABLET(500 MG) BY MOUTH DAILY WITH BREAKFAST 90 tablet 1  . Omega-3 Fatty Acids (FISH OIL) 500 MG CAPS Take by mouth 2 (two) times daily.      . pantoprazole (PROTONIX) 40 MG tablet Take 1 tablet (40 mg total) by mouth daily before breakfast. 90 tablet 3  . triamcinolone cream (KENALOG) 0.1 % Apply 1 application topically 2 (two) times daily.     No current facility-administered medications on file prior to visit.    Review of Systems  Constitutional: Negative for  other unusual diaphoresis or sweats HENT: Negative for ear discharge or swelling Eyes: Negative for other worsening visual disturbances Respiratory: Negative for stridor or other swelling  Gastrointestinal: Negative for worsening distension or other blood Genitourinary: Negative for retention or other urinary change Musculoskeletal: Negative for other MSK pain or swelling Skin: Negative for color change or other new lesions Neurological: Negative for worsening tremors and other numbness  Psychiatric/Behavioral: Negative for worsening agitation or other fatigue All other system neg per pt    Objective:   Physical Exam BP 126/84   Pulse 77   Temp 97.7 F (36.5 C) (Oral)   Ht 6\' 3"  (1.905 m)    Wt 278 lb (126.1 kg)   SpO2 96%   BMI 34.75 kg/m  VS noted,  Constitutional: Pt appears in NAD HENT: Head: NCAT.  Right Ear: External ear normal.  Left Ear: External ear normal.  Eyes: . Pupils are equal, round, and reactive to light. Conjunctivae and EOM are normal Nose: without d/c or deformity Neck: Neck supple. Gross normal ROM Cardiovascular: Normal rate and regular rhythm.   Pulmonary/Chest: Effort normal and breath sounds without rales or wheezing.  Abd:  Soft, NT, ND, + BS, no organomegaly Neurological: Pt is alert. At baseline orientation, motor grossly intact Skin: Skin is warm. nontender dermatitis like rash to extremities and back, + fingers bilat hands with red, tender, swelling new, no LE edema Psychiatric: Pt behavior is normal without agitation  No other exam findings        Assessment & Plan:

## 2018-10-07 NOTE — Assessment & Plan Note (Signed)
Ok for silvadene cream asd

## 2018-10-07 NOTE — Assessment & Plan Note (Signed)
stable overall by history and exam, recent data reviewed with pt, and pt to continue medical treatment as before,  to f/u any worsening symptoms or concerns  

## 2018-10-07 NOTE — Patient Instructions (Addendum)
You had the steroid shot today  Please take all new medication as prescribed - the silvadene cream for the fingers  Please continue all other medications as before, and refills have been done if requested.  Please have the pharmacy call with any other refills you may need.  Please continue your efforts at being more active, low cholesterol diet, and weight control.  Please keep your appointments with your specialists as you may have planned - dermatology tomorrow  Please return in 6 months, or sooner if needed, with Lab testing done 3-5 days before

## 2018-11-11 ENCOUNTER — Ambulatory Visit (INDEPENDENT_AMBULATORY_CARE_PROVIDER_SITE_OTHER): Payer: Medicare Other | Admitting: Pulmonary Disease

## 2018-11-11 ENCOUNTER — Encounter: Payer: Self-pay | Admitting: Pulmonary Disease

## 2018-11-11 VITALS — BP 136/78 | HR 79 | Ht 75.0 in | Wt 278.0 lb

## 2018-11-11 DIAGNOSIS — G4733 Obstructive sleep apnea (adult) (pediatric): Secondary | ICD-10-CM | POA: Diagnosis not present

## 2018-11-11 NOTE — Patient Instructions (Addendum)
History of obstructive sleep apnea on BiPAP therapy  Machine is dated  We will set you up for a home sleep study  We will see you back in the office in about 3 months  Call with any significant concerns Continue with regular exercise and weight loss efforts and appetite is staying thank you

## 2018-11-11 NOTE — Progress Notes (Signed)
Danny [name]    [id number]    06-06-1951  Primary Care Physician:[name], Len Blalock, MD  Referring Physician: Corwin Levins, MD [address] [place], [place] [address]  Chief complaint:   Patient with a history of obstructive sleep apnea  HPI:  Usually goes to bed about 1130 1230, takes a minutes to fall asleep Usually wakes up between 8 and 9 AM Has been using CPAP for many years  Machine is dated significantly and possibly dysfunctional at present He continues to use it on a regular basis  Wakes up frequently at night to use the bathroom  Denies significant muscular skeletal pain or discomfort Has gained some weight recently but working on weight loss  Denies a chronic headaches No dryness of his mouth in the mornings  His last sleep study was at least over 10 years ago  Outpatient Encounter Medications as of 11/11/2018  Medication Sig  . amLODipine (NORVASC) 10 MG tablet TAKE 1 TABLET(10 MG) BY MOUTH DAILY  . aspirin 325 MG tablet Take 325 mg by mouth daily.    [name] atorvastatin (LIPITOR) 10 MG tablet TAKE 1 TABLET BY MOUTH DAILY  . loratadine (CLARITIN) 10 MG tablet Take 1 tablet (10 mg total) by mouth as needed.  [name] losartan (COZAAR) 100 MG tablet TAKE 1 TABLET(100 MG) BY MOUTH DAILY  . metFORMIN (GLUCOPHAGE-XR) 500 MG 24 hr tablet TAKE 1 TABLET(500 MG) BY MOUTH DAILY WITH BREAKFAST  . Omega-3 Fatty Acids (FISH OIL) 500 MG CAPS Take by mouth 2 (two) times daily.    . pantoprazole (PROTONIX) 40 MG tablet Take 1 tablet (40 mg total) by mouth daily before breakfast.  . silver sulfADIAZINE (SILVADENE) 1 % cream Apply 1 application topically daily.  [name] triamcinolone cream (KENALOG) 0.1 % Apply 1 application topically 2 (two) times daily.  . [DISCONTINUED] atorvastatin (LIPITOR) 10 MG tablet Take 1 tablet (10 mg total) by mouth daily.   No facility-administered encounter medications on file as of 11/11/2018.     Allergies as of 11/11/2018 - Review Complete  11/11/2018  Allergen Reaction Noted  . Penicillins  07/30/2009    Past Medical History:  Diagnosis Date  . Allergic rhinitis, cause unspecified 03/12/2012  . Allergy    SEASONAL  . Anxiety   . Diabetes (HCC) 06/26/2008   Centricity Description: DM Qualifier: Diagnosis of  By: Jonny Ruiz MD, Len Blalock  Centricity Description: DIABETES MELLITUS, TYPE II Qualifier: Diagnosis of  By: Jonny Ruiz MD, Len Blalock   . DIVERTICULOSIS, COLON 07/05/2008  . DM w/o Complication Type II 06/26/2008  . HYPERLIPIDEMIA 06/27/2008  . HYPERTENSION 06/27/2008  . Kidney stones   . Mini stroke (HCC)   . OBSTRUCTIVE SLEEP APNEA 06/26/2008  . Sleep apnea    CPAP  . Stroke [facility])    MINI    Past Surgical History:  Procedure Laterality Date  . COLONOSCOPY    . COSMETIC SURGERY    . NOSE SURGERY  1970   cosmetic  . TONSILLECTOMY      Family History  Problem Relation Age of Onset  . Heart disease Father        MI-smoker  . Cancer Maternal Grandfather        type unknown  . Cancer Maternal Grandmother        type unknown  . Emphysema Paternal Uncle     Social History   Socioeconomic History  . Marital status: Married    Spouse  name: Not on file  . Number of children: 2  . Years of education: Not on file  . Highest education level: Not on file  Occupational History  . Occupation: Conservation officer, nature: [employer]  Social Needs  . Financial resource strain: Not on file  . Food insecurity:    Worry: Not on file    Inability: Not on file  . Transportation needs:    Medical: Not on file    Non-medical: Not on file  Tobacco Use  . Smoking status: Never Smoker  . Smokeless tobacco: Never Used  Substance and Sexual Activity  . Alcohol use: Yes    Alcohol/week: 0.0 standard drinks    Comment: 0-2 per day  . Drug use: No  . Sexual activity: Not on file  Lifestyle  . Physical activity:    Days per week: Not on file    Minutes per session: Not on file  . Stress: Not on file  Relationships  . Social  connections:    Talks on phone: Not on file    Gets together: Not on file    Attends religious service: Not on file    Active member of club or organization: Not on file    Attends meetings of clubs or organizations: Not on file    Relationship status: Not on file  . Intimate partner violence:    Fear of current or ex partner: Not on file    Emotionally abused: Not on file    Physically abused: Not on file    Forced sexual activity: Not on file  Other Topics Concern  . 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 He's married 2 daughters. 0-2 alcoholic drinks a day 0-2 caffeinated beverages a day.04/18/2016    Review of Systems  Constitutional: Positive for unexpected weight change.  Respiratory: Positive for apnea.   Psychiatric/Behavioral: Positive for sleep disturbance.  All other systems reviewed and are negative.   Vitals:   11/11/18 0927  BP: 136/78  Pulse: 79  SpO2: 96%     Physical Exam  Constitutional: He is oriented to person, place, and time. He appears well-nourished.  HENT:  Head: Normocephalic and atraumatic.  Mallampati 3  Eyes: Pupils are equal, round, and reactive to light. Right eye exhibits no discharge. Left eye exhibits no discharge.  Neck: Normal range of motion. Neck supple. No tracheal deviation present. No thyromegaly present.  Cardiovascular: Normal rate and regular rhythm.  Pulmonary/Chest: Effort normal and breath sounds normal. No respiratory distress. He has no wheezes.  Abdominal: Soft. Bowel sounds are normal. He exhibits no distension. There is no tenderness.  Musculoskeletal: Normal range of motion. He exhibits no edema or deformity.  Neurological: He is alert and oriented to person, place, and time. No cranial nerve deficit.  Skin: Skin is warm and dry. No erythema.  Psychiatric: He has a normal mood and affect.   Data Reviewed: ESS of  2 Compliance data shows excellent compliance with an AHI of 3.2  Assessment:  History of obstructive sleep apnea -Compliant with treatment  Hypertension-controlled Diabetes-working on weight loss and regular exercises-on metformin  Plan/Recommendations:  We will schedule the patient for home sleep study  Pathophysiology of sleep disordered breathing discussed  Other treatment options discussed  I will see him back in the office in about 3 months  Following his sleep study we will  set him up with an auto titrating machine which he is on at the present time  Virl DiamondAdewale Rishaan Gunner MD  Pulmonary and Critical Care 11/11/2018, 9:28 AM  CC: Corwin LevinsJohn, [name], MD

## 2018-12-04 DIAGNOSIS — G4733 Obstructive sleep apnea (adult) (pediatric): Secondary | ICD-10-CM | POA: Diagnosis not present

## 2018-12-09 ENCOUNTER — Other Ambulatory Visit: Payer: Self-pay | Admitting: *Deleted

## 2018-12-09 DIAGNOSIS — G4733 Obstructive sleep apnea (adult) (pediatric): Secondary | ICD-10-CM

## 2018-12-10 ENCOUNTER — Telehealth: Payer: Self-pay | Admitting: Pulmonary Disease

## 2018-12-10 DIAGNOSIS — G4733 Obstructive sleep apnea (adult) (pediatric): Secondary | ICD-10-CM

## 2018-12-10 NOTE — Telephone Encounter (Signed)
Dr. Wynona Neatlalere has reviewed the home sleep test this showed Moderately severe obstructive sleep apnea.   Recommendations   Treatment options are CPAP with the settings auto 5 to 15.    Weight loss measures .   Advise against driving while sleepy & against medication with sedative side effects.    Make appointment for 3 months for compliance with download with Dr. Wynona Neatlalere.   Spoke with patient he works with [facility] and I have sent in the order and made apt.

## 2018-12-12 ENCOUNTER — Other Ambulatory Visit: Payer: Self-pay | Admitting: Internal Medicine

## 2018-12-20 ENCOUNTER — Other Ambulatory Visit: Payer: Self-pay | Admitting: Internal Medicine

## 2018-12-20 NOTE — Telephone Encounter (Signed)
Copied from CRM [id number]. Topic: Quick Communication - Rx Refill/Question >> Dec 20, 2018 11:25 AM Marylen PontoMcneil, Ja-Kwan wrote: Medication: triamcinolone cream (KENALOG) 0.1 %  Has the patient contacted their pharmacy? no  Preferred Pharmacy (with phone number or street name): [id number] - [place], [place] - [address] [facility] [address] [facility] [address] [phone] (Phone)  [phone] (Fax)  Agent: Please be advised that RX refills may take up to 3 business days. We ask that you follow-up with your pharmacy.

## 2018-12-21 MED ORDER — TRIAMCINOLONE ACETONIDE 0.1 % EX CREA: 1.0000 "application " | TOPICAL_CREAM | Freq: Two times a day (BID) | CUTANEOUS | 1 refills | Status: DC

## 2018-12-21 NOTE — Telephone Encounter (Signed)
Requested medication (s) are due for refill today: yes  Requested medication (s) are on the active medication list: yes  Last refill:  10/07/18  Future visit scheduled: yes  Notes to clinic:  Historical provider    Requested Prescriptions  Pending Prescriptions Disp Refills   triamcinolone cream (KENALOG) 0.1 % 30 g     Sig: Apply 1 application topically 2 (two) times daily.     Dermatology:  Corticosteroids Passed - 12/20/2018  4:40 PM      Passed - Valid encounter within last 12 months    Recent Outpatient Visits          2 months ago Dermatitis   [name], [name], MD   8 months ago Preventative health care   [name], [name], MD   1 year ago Type 2 diabetes mellitus without complication, without long-term current use of insulin [facility](HCC)   [name], [name], MD   1 year ago Preventative health care   [name], [name], MD   2 years ago Preventative health care   [name], [name], MD      Future Appointments            In 1 week [name], [name], MD [facility], [name]   In 3 months [name], [name], MD [facility]   In 3 months [name], [name], MD [name]

## 2018-12-24 ENCOUNTER — Other Ambulatory Visit: Payer: Self-pay | Admitting: Internal Medicine

## 2018-12-30 ENCOUNTER — Encounter: Payer: Self-pay | Admitting: Internal Medicine

## 2018-12-30 ENCOUNTER — Ambulatory Visit (INDEPENDENT_AMBULATORY_CARE_PROVIDER_SITE_OTHER): Payer: Medicare Other | Admitting: Internal Medicine

## 2018-12-30 ENCOUNTER — Encounter

## 2018-12-30 VITALS — BP 142/86 | HR 95 | Temp 98.0°F | Ht 75.0 in | Wt 305.0 lb

## 2018-12-30 DIAGNOSIS — Z23 Encounter for immunization: Secondary | ICD-10-CM | POA: Diagnosis not present

## 2018-12-30 DIAGNOSIS — L03115 Cellulitis of right lower limb: Secondary | ICD-10-CM | POA: Diagnosis not present

## 2018-12-30 DIAGNOSIS — I10 Essential (primary) hypertension: Secondary | ICD-10-CM

## 2018-12-30 DIAGNOSIS — L309 Dermatitis, unspecified: Secondary | ICD-10-CM | POA: Diagnosis not present

## 2018-12-30 DIAGNOSIS — E119 Type 2 diabetes mellitus without complications: Secondary | ICD-10-CM

## 2018-12-30 MED ORDER — PREDNISONE 10 MG PO TABS: ORAL_TABLET | ORAL | 0 refills | Status: DC

## 2018-12-30 MED ORDER — METHYLPREDNISOLONE ACETATE 80 MG/ML IJ SUSP
80.0000 mg | Freq: Once | INTRAMUSCULAR | Status: AC
  Administered 2018-12-30: 80 mg via INTRAMUSCULAR

## 2018-12-30 MED ORDER — TRIAMCINOLONE ACETONIDE 0.1 % EX CREA: 1.0000 "application " | TOPICAL_CREAM | Freq: Two times a day (BID) | CUTANEOUS | 1 refills | Status: DC

## 2018-12-30 MED ORDER — DOXYCYCLINE HYCLATE 100 MG PO TABS: 100.0000 mg | ORAL_TABLET | Freq: Two times a day (BID) | ORAL | 0 refills | Status: DC

## 2018-12-30 NOTE — Assessment & Plan Note (Signed)
stable overall by history and exam, recent data reviewed with pt, and pt to continue medical treatment as before,  to f/u any worsening symptoms or concerns  

## 2018-12-30 NOTE — Assessment & Plan Note (Addendum)
To distal RLE and post achilles insertion site area, for triam cr 0.1 prn,  to f/u any worsening symptoms or concerns, also depomedrol IM 80, predpac asd

## 2018-12-30 NOTE — Assessment & Plan Note (Signed)
Area to distal RLE c/w early infection/cellulitis related to chronic dermatitis above, for doxycycline

## 2018-12-30 NOTE — Progress Notes (Signed)
Subjective:    Patient ID: Danny [name], male    DOB: 09-06-1951, 68 y.o.   MRN: [id number]  HPI  Here to f/u, states rash to hands form last visit oct 2019 has resolved with the steroid tx; did also see just a day later dermatology who tx him for presumed bacterial staph infection with oral antibx course and what sounds like mupirocin.  Has done well since then except for resurgence of non painful itchy rash to right lower leg (rather large area just proximal to medial leg above the ankle) and similar rash much smaller area to left leg distal achilles area.  Has not tried to tx these rash, but then 2 days ago has sudden flare of redness, leg swelling below the knee and pain without red streaks, fever, chills or other unusual symptoms.  Also with rash to abd and back similar as rash tx above per dermatology  Pt denies chest pain, increased sob or doe, wheezing, orthopnea, PND, increased LE swelling, palpitations, dizziness or syncope.   Pt denies polydipsia, polyuria Past Medical History:  Diagnosis Date  . Allergic rhinitis, cause unspecified 03/12/2012  . Allergy    SEASONAL  . Anxiety   . Diabetes (HCC) 06/26/2008   Centricity Description: DM Qualifier: Diagnosis of  By: [name] MD, [name]  Centricity Description: DIABETES MELLITUS, TYPE II Qualifier: Diagnosis of  By: [name] MD, [name]   . DIVERTICULOSIS, COLON 07/05/2008  . DM w/o Complication Type II 06/26/2008  . HYPERLIPIDEMIA 06/27/2008  . HYPERTENSION 06/27/2008  . Kidney stones   . Mini stroke (HCC)   . OBSTRUCTIVE SLEEP APNEA 06/26/2008  . Sleep apnea    CPAP  . Stroke [facility])    MINI   Past Surgical History:  Procedure Laterality Date  . COLONOSCOPY    . COSMETIC SURGERY    . NOSE SURGERY  1970   cosmetic  . TONSILLECTOMY      reports that he has never smoked. He has never used smokeless tobacco. He reports current alcohol use. He reports that he does not use drugs. family history includes Cancer in his maternal grandfather and  maternal grandmother; Emphysema in his paternal uncle; Heart disease in his father. Allergies  Allergen Reactions  . Penicillins    Current Outpatient Medications on File Prior to Visit  Medication Sig Dispense Refill  . amLODipine (NORVASC) 10 MG tablet TAKE 1 TABLET(10 MG) BY MOUTH DAILY 90 tablet 1  . aspirin 325 MG tablet Take 325 mg by mouth daily.      [name]. atorvastatin (LIPITOR) 10 MG tablet TAKE 1 TABLET BY MOUTH DAILY 90 tablet 0  . loratadine (CLARITIN) 10 MG tablet Take 1 tablet (10 mg total) by mouth as needed. 90 tablet 3  . losartan (COZAAR) 100 MG tablet TAKE 1 TABLET(100 MG) BY MOUTH DAILY 90 tablet 1  . metFORMIN (GLUCOPHAGE-XR) 500 MG 24 hr tablet TAKE 1 TABLET(500 MG) BY MOUTH DAILY WITH BREAKFAST 90 tablet 1  . Omega-3 Fatty Acids (FISH OIL) 500 MG CAPS Take by mouth 2 (two) times daily.      . pantoprazole (PROTONIX) 40 MG tablet Take 1 tablet (40 mg total) by mouth daily before breakfast. 90 tablet 3  . silver sulfADIAZINE (SILVADENE) 1 % cream Apply 1 application topically daily. 50 g 1   No current facility-administered medications on file prior to visit.    Review of Systems  Constitutional: Negative for other unusual diaphoresis or sweats HENT: Negative for ear discharge  or swelling Eyes: Negative for other worsening visual disturbances Respiratory: Negative for stridor or other swelling  Gastrointestinal: Negative for worsening distension or other blood Genitourinary: Negative for retention or other urinary change Musculoskeletal: Negative for other MSK pain or swelling Skin: Negative for color change or other new lesions Neurological: Negative for worsening tremors and other numbness  Psychiatric/Behavioral: Negative for worsening agitation or other fatigue All other system neg per pt    Objective:   Physical Exam BP (!) 142/86   Pulse 95   Temp 98 F (36.7 C) (Oral)   Ht 6\' 3"  (1.905 m)   Wt (!) 305 lb (138.3 kg)   SpO2 96%   BMI 38.12 kg/m  VS  noted, non toxic Constitutional: Pt appears in NAD HENT: Head: NCAT.  Right Ear: External ear normal.  Left Ear: External ear normal.  Eyes: . Pupils are equal, round, and reactive to light. Conjunctivae and EOM are normal Nose: without d/c or deformity Neck: Neck supple. Gross normal ROM Cardiovascular: Normal rate and regular rhythm.   Pulmonary/Chest: Effort normal and breath sounds without rales or wheezing.  Neurological: Pt is alert. At baseline orientation, motor grossly intact Skin: Skin is warm. 1+ RLE edema from kne to ankle only; large area marked superficial oozing tender 4 x 6 cm area just proximal to ankle medial distal leg with distinct edges, no red streaks or over pus, similar area without tender swelling to 2 x 1 cm area left achilles insertion site area , also abd and upper back with folliculitis like rash numerous small lesions Psychiatric: Pt behavior is normal without agitation  No other exam findings Lab Results  Component Value Date   WBC 4.9 04/06/2018   HGB 14.7 04/06/2018   HCT 43.9 04/06/2018   PLT 171.0 04/06/2018   GLUCOSE 139 (H) 10/06/2018   CHOL 106 10/06/2018   TRIG 50.0 10/06/2018   HDL 39.80 10/06/2018   LDLCALC 56 10/06/2018   ALT 14 10/06/2018   AST 11 10/06/2018   NA 139 10/06/2018   K 4.4 10/06/2018   CL 103 10/06/2018   CREATININE 0.92 10/06/2018   BUN 16 10/06/2018   CO2 28 10/06/2018   TSH 2.24 04/06/2018   PSA 1.09 04/06/2018   HGBA1C 6.2 10/06/2018   MICROALBUR <0.7 04/06/2018       Assessment & Plan:

## 2018-12-30 NOTE — Patient Instructions (Signed)
You had the Pneumovax pneumonia shot today  You had the steroid shot today  Please take all new medication as prescribed - the pill antibiotics for 2 weeks, the prednisone and the topical cream as needed  Please continue all other medications as before, and refills have been done if requested.  Please have the pharmacy call with any other refills you may need.  Please keep your appointments with your specialists as you may have planned

## 2018-12-31 ENCOUNTER — Telehealth: Payer: Self-pay

## 2018-12-31 MED ORDER — DOXYCYCLINE HYCLATE 100 MG PO TABS: 100.0000 mg | ORAL_TABLET | Freq: Two times a day (BID) | ORAL | 0 refills | Status: DC

## 2018-12-31 NOTE — Addendum Note (Signed)
Addended by: Corwin Levins on: 12/31/2018 01:06 PM   Modules accepted: Orders

## 2018-12-31 NOTE — Telephone Encounter (Signed)
Copied from CRM [id number]. Topic: General - Other >> Dec 31, 2018 10:40 AM Gean Birchwood R wrote: Patient called in and stated he was advised to take Doxycycline Hyclate 100 mg Oral 2 times daily  for 2 weeks but was only sent a script for 10days. Please advise.

## 2018-12-31 NOTE — Telephone Encounter (Signed)
Ok this is corrected 

## 2019-01-11 ENCOUNTER — Telehealth: Payer: Self-pay | Admitting: Pulmonary Disease

## 2019-01-11 NOTE — Telephone Encounter (Signed)
Called and spoke with Danny [name].  She stated that everything needed from [facility].  It is in review and waiting on approval.  She stated that they should be contacting Patient by the end of the week. Called and explained what Danny [name], Danny [name], has told me. Explained to patient that if he has not heard from [facility] by Friday, to call [facility] back.  Understanding stated.  Nothing further at this time.

## 2019-03-07 ENCOUNTER — Other Ambulatory Visit: Payer: Self-pay

## 2019-03-07 MED ORDER — ATORVASTATIN CALCIUM 10 MG PO TABS: 10.0000 mg | ORAL_TABLET | Freq: Every day | ORAL | 1 refills | Status: DC

## 2019-03-18 ENCOUNTER — Ambulatory Visit (INDEPENDENT_AMBULATORY_CARE_PROVIDER_SITE_OTHER): Payer: Medicare Other | Admitting: Pulmonary Disease

## 2019-03-18 ENCOUNTER — Other Ambulatory Visit: Payer: Self-pay

## 2019-03-18 ENCOUNTER — Encounter: Payer: Self-pay | Admitting: Pulmonary Disease

## 2019-03-18 DIAGNOSIS — G4733 Obstructive sleep apnea (adult) (pediatric): Secondary | ICD-10-CM

## 2019-03-18 NOTE — Progress Notes (Signed)
Virtual Visit via Telephone Note  I connected with Danny [name] on 03/18/19 at 11:00 AM EDT by telephone and verified that I am speaking with the correct person using two identifiers.   I discussed the limitations, risks, security and privacy concerns of performing an evaluation and management service by telephone and the availability of in person appointments. I also discussed with the patient that there may be a patient responsible charge related to this service. The patient expressed understanding and agreed to proceed.   History of Present Illness:  Patient consented to consult via telephone: Yes People present and their role in pt care: Pt  Chief complaint: OSA on CPAP   68 year old male never smoker followed in our office for obstructive sleep apnea.  Patient has been managed on CPAP for many years.  He had to complete another home sleep study because he was switching to Medicare.  He completed the sleep study in December/2019 that revealed an AHI of 24.5.  Patient then was started on CPAP.  Patient reports excellent CPAP compliance.  CPAP compliance report listed below confirms this:  02/15/2019-03/16/2019-CPAP compliance report-30 out of last 30 days use, all 30 those days greater than 4 hours, average usage days 7 hours and 11 minutes, APAP settings 5-15, AHI 3  Patient confirms that this is going well.  He reports that he uses CPAP every night.  He is doing fine with no issues.  Observations/Objective:  12/04/2018-home sleep study- AHI 24.5, SaO2 low 85%  02/15/2019-03/16/2019-CPAP compliance report-30 out of last 30 days use, all 30 those days greater than 4 hours, average usage days 7 hours and 11 minutes, APAP settings 5-15, AHI 3  Assessment and Plan:  OBSTRUCTIVE SLEEP APNEA Assessment: December/2019 home sleep study showing AHI of 24.5 CPAP compliance report reviewed today over the phone with the patient shows excellent compliance as well as a well-controlled AHI of  3 Last weight in chart is 305 pounds Patient reports no issues with using CPAP he reports he is used CPAP for many years and he feels fine he has no current questions  Plan: Continue CPAP as ordered Follow-up in 1 year   Follow Up Instructions:  Follow up in 1 year    I discussed the assessment and treatment plan with the patient. The patient was provided an opportunity to ask questions and all were answered. The patient agreed with the plan and demonstrated an understanding of the instructions.   The patient was advised to call back or seek an in-person evaluation if the symptoms worsen or if the condition fails to improve as anticipated.  I provided 15 minutes of non-face-to-face time during this encounter.   Coral Ceo, NP

## 2019-03-18 NOTE — Assessment & Plan Note (Signed)
Assessment: December/2019 home sleep study showing AHI of 24.5 CPAP compliance report reviewed today over the phone with the patient shows excellent compliance as well as a well-controlled AHI of 3 Last weight in chart is 305 pounds Patient reports no issues with using CPAP he reports he is used CPAP for many years and he feels fine he has no current questions  Plan: Continue CPAP as ordered Follow-up in 1 year

## 2019-03-18 NOTE — Patient Instructions (Addendum)
Great job using your CPAP Follow-up with our office in 1 year with Dr. Wynona Neat  We recommend that you continue using your CPAP daily >>>Keep up the hard work using your device >>> Goal should be wearing this for the entire night that you are sleeping, at least 4 to 6 hours  Remember:  . Do not drive or operate heavy machinery if tired or drowsy.  . Please notify the supply company and office if you are unable to use your device regularly due to missing supplies or machine being broken.  . Work on maintaining a healthy weight and following your recommended nutrition plan  . Maintain proper daily exercise and movement  . Maintaining proper use of your device can also help improve management of other chronic illnesses such as: Blood pressure, blood sugars, and weight management.   BiPAP/ CPAP Cleaning:  >>>Clean weekly, with Dawn soap, and bottle brush.  Set up to air dry.    Coronavirus (COVID-19) Are you at risk?  Are you at risk for the Coronavirus (COVID-19)?  To be considered HIGH RISK for Coronavirus (COVID-19), you have to meet the following criteria:  . Traveled to [place], [place], [place], [place] or [place]; or in the [place] to [place], [place], [place], or [place]; and have fever, cough, and shortness of breath within the last 2 weeks of travel OR . Been in close contact with a person diagnosed with COVID-19 within the last 2 weeks and have fever, cough, and shortness of breath . IF YOU DO NOT MEET THESE CRITERIA, YOU ARE CONSIDERED LOW RISK FOR COVID-19.  What to do if you are HIGH RISK for COVID-19?  [name] If you are having a medical emergency, call 911. . Seek medical care right away. Before you go to a doctor's office, urgent care or emergency department, call ahead and tell them about your recent travel, contact with someone diagnosed with COVID-19, and your symptoms. You should receive instructions from your physician's office regarding next steps of care.   . When you arrive at healthcare provider, tell the healthcare staff immediately you have returned from visiting [place], [place], [place], [place] or [place]; or traveled in the [place] to [place], [place], [place], or [place]; in the last two weeks or you have been in close contact with a person diagnosed with COVID-19 in the last 2 weeks.   . Tell the health care staff about your symptoms: fever, cough and shortness of breath. . After you have been seen by a medical provider, you will be either: o Tested for (COVID-19) and discharged home on quarantine except to seek medical care if symptoms worsen, and asked to  - Stay home and avoid contact with others until you get your results (4-5 days)  - Avoid travel on public transportation if possible (such as bus, train, or airplane) or o Sent to the Emergency Department by EMS for evaluation, COVID-19 testing, and possible admission depending on your condition and test results.  What to do if you are LOW RISK for COVID-19?  Reduce your risk of any infection by using the same precautions used for avoiding the common cold or flu:  [name] Wash your hands often with soap and warm water for at least 20 seconds.  If soap and water are not readily available, use an alcohol-based hand sanitizer with at least 60% alcohol.  . If coughing or sneezing, cover your mouth and nose by coughing or sneezing into the elbow  areas of your shirt or coat, into a tissue or into your sleeve (not your hands). . Avoid shaking hands with others and consider head nods or verbal greetings only. . Avoid touching your eyes, nose, or mouth with unwashed hands.  . Avoid close contact with people who are sick. . Avoid places or events with large numbers of people in one location, like concerts or sporting events. . Carefully consider travel plans you have or are making. . If you are planning any travel outside or inside the [place], visit the CDC's Travelers' Health webpage for the  latest health notices. . If you have some symptoms but not all symptoms, continue to monitor at home and seek medical attention if your symptoms worsen. . If you are having a medical emergency, call 911.   ADDITIONAL HEALTHCARE OPTIONS FOR PATIENTS  [facility] / e-Visit: [email or web address]         [facility]: [phone]  [facility]: [phone]                   [facility]: [phone]           It is flu season:   >>> Best ways to protect herself from the flu: Receive the yearly flu vaccine, practice good hand hygiene washing with soap and also using hand sanitizer when available, eat a nutritious meals, get adequate rest, hydrate appropriately   Please contact the office if your symptoms worsen or you have concerns that you are not improving.   Thank you for choosing [facility] for your healthcare, and for allowing [place] to partner with you on your healthcare journey. I am thankful to be able to provide care to you today.   Elisha Headland FNP-C    CPAP and BPAP Information CPAP and BPAP are methods of helping a person breathe with the use of air pressure. CPAP stands for "continuous positive airway pressure." BPAP stands for "bi-level positive airway pressure." In both methods, air is blown through your nose or mouth and into your air passages to help you breathe well. CPAP and BPAP use different amounts of pressure to blow air. With CPAP, the amount of pressure stays the same while you breathe in and out. With BPAP, the amount of pressure is increased when you breathe in (inhale) so that you can take larger breaths. Your health care provider will recommend whether CPAP or BPAP would be more helpful for you. Why are CPAP and BPAP treatments used? CPAP or BPAP can be helpful if you have:  Sleep apnea.  Chronic obstructive pulmonary disease (COPD).  Heart  failure.  Medical conditions that weaken the muscles of the chest including muscular dystrophy, or neurological diseases such as amyotrophic lateral sclerosis (ALS).  Other problems that cause breathing to be weak, abnormal, or difficult. CPAP is most commonly used for obstructive sleep apnea (OSA) to keep the airways from collapsing when the muscles relax during sleep. How is CPAP or BPAP administered? Both CPAP and BPAP are provided by a small machine with a flexible plastic tube that attaches to a plastic mask. You wear the mask. Air is blown through the mask into your nose or mouth. The amount of pressure that is used to blow the air can be adjusted on the machine. Your health care provider will determine the pressure setting that should be used based on your individual needs. When should CPAP or BPAP be used? In most cases, the mask only  needs to be worn during sleep. Generally, the mask needs to be worn throughout the night and during any daytime naps. People with certain medical conditions may also need to wear the mask at other times when they are awake. Follow instructions from your health care provider about when to use the machine. What are some tips for using the mask?   Because the mask needs to be snug, some people feel trapped or closed-in (claustrophobic) when first using the mask. If you feel this way, you may need to get used to the mask. One way to do this is by holding the mask loosely over your nose or mouth and then gradually applying the mask more snugly. You can also gradually increase the amount of time that you use the mask.  Masks are available in various types and sizes. Some fit over your mouth and nose while others fit over just your nose. If your mask does not fit well, talk with your health care provider about getting a different one.  If you are using a mask that fits over your nose and you tend to breathe through your mouth, a chin strap may be applied to help keep  your mouth closed.  The CPAP and BPAP machines have alarms that may sound if the mask comes off or develops a leak.  If you have trouble with the mask, it is very important that you talk with your health care provider about finding a way to make the mask easier to tolerate. Do not stop using the mask. Stopping the use of the mask could have a negative impact on your health. What are some tips for using the machine?  Place your CPAP or BPAP machine on a secure table or stand near an electrical outlet.  Know where the on/off switch is located on the machine.  Follow instructions from your health care provider about how to set the pressure on your machine and when you should use it.  Do not eat or drink while the CPAP or BPAP machine is on. Food or fluids could get pushed into your lungs by the pressure of the CPAP or BPAP.  Do not smoke. Tobacco smoke residue can damage the machine.  For home use, CPAP and BPAP machines can be rented or purchased through home health care companies. Many different brands of machines are available. Renting a machine before purchasing may help you find out which particular machine works well for you.  Keep the CPAP or BPAP machine and attachments clean. Ask your health care provider for specific instructions. Get help right away if:  You have redness or open areas around your nose or mouth where the mask fits.  You have trouble using the CPAP or BPAP machine.  You cannot tolerate wearing the CPAP or BPAP mask.  You have pain, discomfort, and bloating in your abdomen. Summary  CPAP and BPAP are methods of helping a person breathe with the use of air pressure.  Both CPAP and BPAP are provided by a small machine with a flexible plastic tube that attaches to a plastic mask.  If you have trouble with the mask, it is very important that you talk with your health care provider about finding a way to make the mask easier to tolerate. This information is not  intended to replace advice given to you by your health care provider. Make sure you discuss any questions you have with your health care provider. Document Released: 09/05/2004 Document Revised: 08/10/2018 Document Reviewed:  10/27/2016 Elsevier Interactive Patient Education  [employer].

## 2019-03-21 ENCOUNTER — Ambulatory Visit: Payer: Self-pay | Admitting: Pulmonary Disease

## 2019-04-12 ENCOUNTER — Ambulatory Visit: Payer: Self-pay | Admitting: Internal Medicine

## 2019-06-06 ENCOUNTER — Other Ambulatory Visit: Payer: Self-pay | Admitting: *Deleted

## 2019-06-06 MED ORDER — AMLODIPINE BESYLATE 10 MG PO TABS: ORAL_TABLET | ORAL | 1 refills | Status: DC

## 2019-06-06 MED ORDER — METFORMIN HCL ER 500 MG PO TB24: ORAL_TABLET | ORAL | 1 refills | Status: DC

## 2019-06-06 MED ORDER — LOSARTAN POTASSIUM 100 MG PO TABS: ORAL_TABLET | ORAL | 1 refills | Status: DC

## 2019-06-06 NOTE — Addendum Note (Signed)
Addended by: Cresenciano Lick on: 06/06/2019 11:24 AM   Modules accepted: Orders

## 2019-06-13 ENCOUNTER — Telehealth: Payer: Self-pay | Admitting: Emergency Medicine

## 2019-06-13 DIAGNOSIS — Z0000 Encounter for general adult medical examination without abnormal findings: Secondary | ICD-10-CM

## 2019-06-13 DIAGNOSIS — I10 Essential (primary) hypertension: Secondary | ICD-10-CM

## 2019-06-13 NOTE — Telephone Encounter (Signed)
Pt has appt on Wednesday and would like to have blood work done before appt. Please enter blood work. Thanks

## 2019-06-13 NOTE — Telephone Encounter (Signed)
Sorry, medicare will not pay for lab testing prior to a visit, and I cannot make an exception as we are losing $1 million dollars this year and we cannot afford to write it off

## 2019-06-14 ENCOUNTER — Other Ambulatory Visit (INDEPENDENT_AMBULATORY_CARE_PROVIDER_SITE_OTHER): Payer: Medicare Other

## 2019-06-14 DIAGNOSIS — Z125 Encounter for screening for malignant neoplasm of prostate: Secondary | ICD-10-CM | POA: Diagnosis not present

## 2019-06-14 DIAGNOSIS — Z0000 Encounter for general adult medical examination without abnormal findings: Secondary | ICD-10-CM

## 2019-06-14 DIAGNOSIS — I10 Essential (primary) hypertension: Secondary | ICD-10-CM | POA: Diagnosis not present

## 2019-06-14 LAB — BASIC METABOLIC PANEL
BUN: 13 mg/dL (ref 6–23)
CO2: 28 mEq/L (ref 19–32)
Calcium: 8.8 mg/dL (ref 8.4–10.5)
Chloride: 103 mEq/L (ref 96–112)
Creatinine, Ser: 0.88 mg/dL (ref 0.40–1.50)
GFR: 86.68 mL/min (ref >60.00)
Glucose, Bld: 138 mg/dL — ABNORMAL HIGH (ref 70–99)
Potassium: 4.1 mEq/L (ref 3.5–5.1)
Sodium: 138 mEq/L (ref 135–145)

## 2019-06-14 LAB — URINALYSIS, ROUTINE W REFLEX MICROSCOPIC
Bilirubin Urine: NEGATIVE
Hgb urine dipstick: NEGATIVE
Ketones, ur: NEGATIVE
Leukocytes,Ua: NEGATIVE
Nitrite: NEGATIVE
RBC / HPF: NONE SEEN (ref 0–?)
Specific Gravity, Urine: 1.025 (ref 1.000–1.030)
Total Protein, Urine: NEGATIVE
Urine Glucose: NEGATIVE
Urobilinogen, UA: 0.2 (ref 0.0–1.0)
pH: 6 (ref 5.0–8.0)

## 2019-06-14 LAB — CBC WITH DIFFERENTIAL/PLATELET
Basophils Absolute: 0 10*3/uL (ref 0.0–0.1)
Basophils Relative: 1 % (ref 0.0–3.0)
Eosinophils Absolute: 0.3 10*3/uL (ref 0.0–0.7)
Eosinophils Relative: 5.9 % — ABNORMAL HIGH (ref 0.0–5.0)
HCT: 43.1 % (ref 39.0–52.0)
Hemoglobin: 14.2 g/dL (ref 13.0–17.0)
Lymphocytes Relative: 27.3 % (ref 12.0–46.0)
Lymphs Abs: 1.3 10*3/uL (ref 0.7–4.0)
MCHC: 33 g/dL (ref 30.0–36.0)
MCV: 87.5 fl (ref 78.0–100.0)
Monocytes Absolute: 0.5 10*3/uL (ref 0.1–1.0)
Monocytes Relative: 10.5 % (ref 3.0–12.0)
Neutro Abs: 2.7 10*3/uL (ref 1.4–7.7)
Neutrophils Relative %: 55.3 % (ref 43.0–77.0)
Platelets: 165 10*3/uL (ref 150.0–400.0)
RBC: 4.93 Mil/uL (ref 4.22–5.81)
RDW: 13.3 % (ref 11.5–15.5)
WBC: 4.8 10*3/uL (ref 4.0–10.5)

## 2019-06-14 LAB — LIPID PANEL
Cholesterol: 121 mg/dL (ref 0–200)
HDL: 39.1 mg/dL (ref >39.00)
LDL Cholesterol: 67 mg/dL (ref 0–99)
NonHDL: 81.51
Total CHOL/HDL Ratio: 3
Triglycerides: 74 mg/dL (ref 0.0–149.0)
VLDL: 14.8 mg/dL (ref 0.0–40.0)

## 2019-06-14 LAB — HEPATIC FUNCTION PANEL
ALT: 32 U/L (ref 0–53)
AST: 28 U/L (ref 0–37)
Albumin: 4.5 g/dL (ref 3.5–5.2)
Alkaline Phosphatase: 61 U/L (ref 39–117)
Bilirubin, Direct: 0.2 mg/dL (ref 0.0–0.3)
Total Bilirubin: 0.8 mg/dL (ref 0.2–1.2)
Total Protein: 6.3 g/dL (ref 6.0–8.3)

## 2019-06-14 LAB — TSH: TSH: 2 u[IU]/mL (ref 0.35–4.50)

## 2019-06-14 LAB — PSA: PSA: 1.06 ng/mL (ref 0.10–4.00)

## 2019-06-14 NOTE — Telephone Encounter (Signed)
Pt has been informed but wants to proceed with lab work anyway. He has been informed and expressed understanding that its a high possibility that he will get billed.

## 2019-06-15 ENCOUNTER — Ambulatory Visit (INDEPENDENT_AMBULATORY_CARE_PROVIDER_SITE_OTHER): Payer: Medicare Other | Admitting: Internal Medicine

## 2019-06-15 ENCOUNTER — Encounter: Payer: Self-pay | Admitting: Internal Medicine

## 2019-06-15 ENCOUNTER — Other Ambulatory Visit: Payer: Self-pay

## 2019-06-15 VITALS — BP 126/84 | HR 83 | Temp 97.6°F | Ht 75.0 in | Wt 298.0 lb

## 2019-06-15 DIAGNOSIS — E785 Hyperlipidemia, unspecified: Secondary | ICD-10-CM | POA: Diagnosis not present

## 2019-06-15 DIAGNOSIS — E119 Type 2 diabetes mellitus without complications: Secondary | ICD-10-CM | POA: Diagnosis not present

## 2019-06-15 DIAGNOSIS — I10 Essential (primary) hypertension: Secondary | ICD-10-CM

## 2019-06-15 LAB — POCT GLYCOSYLATED HEMOGLOBIN (HGB A1C): Hemoglobin A1C: 5.9 % — AB (ref 4.0–5.6)

## 2019-06-15 NOTE — Patient Instructions (Signed)
Your A1C was OK today  Please continue all other medications as before, and refills have been done if requested.  Please have the pharmacy call with any other refills you may need.  Please continue your efforts at being more active, low cholesterol diet, and weight control.  You are otherwise up to date with prevention measures today.  Please keep your appointments with your specialists as you may have planned  Please return in 6 months, or sooner if needed, with Lab testing done 3-5 days before

## 2019-06-15 NOTE — Assessment & Plan Note (Signed)
stable overall by history and exam, recent data reviewed with pt, and pt to continue medical treatment as before,  to f/u any worsening symptoms or concerns  

## 2019-06-15 NOTE — Progress Notes (Signed)
Subjective:    Patient ID: Danny [name], male    DOB: 1951/10/20, 68 y.o.   MRN: [id number]  HPI  Here for yearly f/u;  Overall doing ok;  Pt denies Chest pain, worsening SOB, DOE, wheezing, orthopnea, PND, worsening LE edema, palpitations, dizziness or syncope.  Pt denies neurological change such as new headache, facial or extremity weakness.  Pt denies polydipsia, polyuria, or low sugar symptoms. Pt states overall good compliance with treatment and medications, good tolerability, and has been trying to follow appropriate diet.  Pt denies worsening depressive symptoms, suicidal ideation or panic. No fever, night sweats, wt loss, loss of appetite, or other constitutional symptoms.  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  Plans to get eye exam soon.   Wt Readings from Last 3 Encounters:  06/15/19 298 lb (135.2 kg)  12/30/18 (!) 305 lb (138.3 kg)  11/11/18 278 lb (126.1 kg)   Past Medical History:  Diagnosis Date  . Allergic rhinitis, cause unspecified 03/12/2012  . Allergy    SEASONAL  . Anxiety   . Diabetes ([place]) 06/26/2008   Centricity Description: DM Qualifier: Diagnosis of  By: Jenny Reichmann MD, Hunt Oris  Centricity Description: DIABETES MELLITUS, TYPE II Qualifier: Diagnosis of  By: Jenny Reichmann MD, Hunt Oris   . DIVERTICULOSIS, COLON 07/05/2008  . DM w/o Complication Type II 03/28/8031  . HYPERLIPIDEMIA 06/27/2008  . HYPERTENSION 06/27/2008  . Kidney stones   . Mini stroke ([place])   . OBSTRUCTIVE SLEEP APNEA 06/26/2008  . Sleep apnea    CPAP  . Stroke [facility])    MINI   Past Surgical History:  Procedure Laterality Date  . COLONOSCOPY    . COSMETIC SURGERY    . NOSE SURGERY  1970   cosmetic  . TONSILLECTOMY      reports that he has never smoked. He has never used  smokeless tobacco. He reports current alcohol use. He reports that he does not use drugs. family history includes Cancer in his maternal grandfather and maternal grandmother; Emphysema in his paternal uncle; Heart disease in his father. Allergies  Allergen Reactions  . Penicillins    Current Outpatient Medications on File Prior to Visit  Medication Sig Dispense Refill  . amLODipine (NORVASC) 10 MG tablet TAKE 1 TABLET(10 MG) BY MOUTH DAILY 90 tablet 1  . aspirin 325 MG tablet Take 325 mg by mouth daily.      [name] atorvastatin (LIPITOR) 10 MG tablet Take 1 tablet (10 mg total) by mouth daily. 90 tablet 1  . doxycycline (VIBRA-TABS) 100 MG tablet Take 1 tablet (100 mg total) by mouth 2 (two) times daily. 28 tablet 0  . loratadine (CLARITIN) 10 MG tablet Take 1 tablet (10 mg total) by mouth as needed. 90 tablet 3  . losartan (COZAAR) 100 MG tablet TAKE 1 TABLET(100 MG) BY MOUTH DAILY 90 tablet 1  . metFORMIN (GLUCOPHAGE-XR) 500 MG 24 hr tablet TAKE 1 TABLET(500 MG) BY MOUTH DAILY WITH BREAKFAST 90 tablet 1  . Omega-3 Fatty Acids (FISH OIL) 500 MG CAPS Take by mouth 2 (two) times daily.      . pantoprazole (PROTONIX) 40 MG tablet Take 1 tablet (40 mg total) by mouth daily before breakfast. 90 tablet 3  . predniSONE (DELTASONE) 10  MG tablet 3 tabs by mouth per day for 3 days,2tabs per day for 3 days,1tab per day for 3 days 18 tablet 0  . silver sulfADIAZINE (SILVADENE) 1 % cream Apply 1 application topically daily. 50 g 1  . triamcinolone cream (KENALOG) 0.1 % Apply 1 application topically 2 (two) times daily. 453.6 g 1   No current facility-administered medications on file prior to visit.    Review of Systems Constitutional: Negative for other unusual diaphoresis, sweats, appetite or weight changes HENT: Negative for other worsening hearing loss, ear pain, facial swelling, mouth sores or neck stiffness.   Eyes: Negative for other worsening pain, redness or other visual disturbance.  Respiratory:  Negative for other stridor or swelling Cardiovascular: Negative for other palpitations or other chest pain  Gastrointestinal: Negative for worsening diarrhea or loose stools, blood in stool, distention or other pain Genitourinary: Negative for hematuria, flank pain or other change in urine volume.  Musculoskeletal: Negative for myalgias or other joint swelling.  Skin: Negative for other color change, or other wound or worsening drainage.  Neurological: Negative for other syncope or numbness. Hematological: Negative for other adenopathy or swelling Psychiatric/Behavioral: Negative for hallucinations, other worsening agitation, SI, self-injury, or new decreased concentration All other system neg per pt    Objective:   Physical Exam BP 126/84   Pulse 83   Temp 97.6 F (36.4 C) (Oral)   Ht 6\' 3"  (1.905 m)   Wt 298 lb (135.2 kg)   SpO2 97%   BMI 37.25 kg/m  VS noted, morbid obese  Constitutional: Pt is oriented to person, place, and time. Appears well-developed and well-nourished, in no significant distress and comfortable Head: Normocephalic and atraumatic  Eyes: Conjunctivae and EOM are normal. Pupils are equal, round, and reactive to light Right Ear: External ear normal without discharge Left Ear: External ear normal without discharge Nose: Nose without discharge or deformity Mouth/Throat: Oropharynx is without other ulcerations and moist  Neck: Normal range of motion. Neck supple. No JVD present. No tracheal deviation present or significant neck LA or mass Cardiovascular: Normal rate, regular rhythm, normal heart sounds and intact distal pulses.   Pulmonary/Chest: WOB normal and breath sounds without rales or wheezing  Abdominal: Soft. Bowel sounds are normal. NT. No HSM  Musculoskeletal: Normal range of motion. Exhibits no edema Lymphadenopathy: Has no other cervical adenopathy.  Neurological: Pt is alert and oriented to person, place, and time. Pt has normal reflexes. No cranial  nerve deficit. Motor grossly intact, Gait intact Skin: Skin is warm and dry. No rash noted or new ulcerations Psychiatric:  Has normal mood and affect. Behavior is normal without agitation No other exam findings Lab Results  Component Value Date   WBC 4.8 06/14/2019   HGB 14.2 06/14/2019   HCT 43.1 06/14/2019   PLT 165.0 06/14/2019   GLUCOSE 138 (H) 06/14/2019   CHOL 121 06/14/2019   TRIG 74.0 06/14/2019   HDL 39.10 06/14/2019   LDLCALC 67 06/14/2019   ALT 32 06/14/2019   AST 28 06/14/2019   NA 138 06/14/2019   K 4.1 06/14/2019   CL 103 06/14/2019   CREATININE 0.88 06/14/2019   BUN 13 06/14/2019   CO2 28 06/14/2019   TSH 2.00 06/14/2019   PSA 1.06 06/14/2019   HGBA1C 5.9 (A) 06/15/2019   MICROALBUR <0.7 04/06/2018     POCT glycosylated hemoglobin (Hb A1C) Order: [id number] Status:  Final result Visible to patient:  No (not released) Dx:  Type 2 diabetes mellitus without  comp...  Ref Range & Units 10:50 (06/15/19) 69mo ago (10/06/18) 7240yr ago (04/06/18) 7640yr ago (09/28/17) 4127yr ago (03/30/17) 5015yr ago (02/21/16) 326yr ago (02/20/15)  Hemoglobin A1C 4.0 - 5.6 % 5.9Abnormal   6.2 R, CM  6.2 R, CM  6.1 R, CM  8.0High  R, CM  6.8High  R, CM  6.7High             Assessment & Plan:

## 2019-06-17 ENCOUNTER — Telehealth: Payer: Self-pay | Admitting: Internal Medicine

## 2019-06-17 MED ORDER — METFORMIN HCL 500 MG PO TABS: 500.0000 mg | ORAL_TABLET | Freq: Every day | ORAL | 3 refills | Status: DC

## 2019-06-17 NOTE — Telephone Encounter (Signed)
Patient is calling regarding the drug metaformin due the recall Please advise [phone]

## 2019-06-17 NOTE — Addendum Note (Signed)
Addended by: Biagio Borg on: 06/17/2019 05:13 PM   Modules accepted: Orders

## 2019-06-17 NOTE — Telephone Encounter (Signed)
Thanks for your inquiry about the concern related to a possible cancer causing contaminant to the metformin ER.   We will simply need to change your metformin ER to the regular metformin as there has been no concern about this with the regular metformin.  We will send a new prescription, and simply start this when you are able instead.   

## 2019-06-20 NOTE — Telephone Encounter (Signed)
Pt has been informed and expressed understanding.  

## 2019-07-29 ENCOUNTER — Encounter: Payer: Self-pay | Admitting: Physician Assistant

## 2019-07-29 ENCOUNTER — Other Ambulatory Visit: Payer: Self-pay

## 2019-07-29 ENCOUNTER — Ambulatory Visit (INDEPENDENT_AMBULATORY_CARE_PROVIDER_SITE_OTHER): Payer: Medicare Other | Admitting: Physician Assistant

## 2019-07-29 VITALS — BP 140/80 | HR 78 | Temp 98.8°F | Resp 16 | Ht 75.0 in | Wt 299.0 lb

## 2019-07-29 DIAGNOSIS — S70362A Insect bite (nonvenomous), left thigh, initial encounter: Secondary | ICD-10-CM | POA: Diagnosis not present

## 2019-07-29 DIAGNOSIS — W57XXXA Bitten or stung by nonvenomous insect and other nonvenomous arthropods, initial encounter: Secondary | ICD-10-CM

## 2019-07-29 MED ORDER — DOXYCYCLINE HYCLATE 100 MG PO CAPS: 100.0000 mg | ORAL_CAPSULE | Freq: Two times a day (BID) | ORAL | 0 refills | Status: DC

## 2019-07-29 NOTE — Patient Instructions (Signed)
Please keep the skin clean and dry. The area of redness and itching is related to a histamine response from the body. This is common and is sometimes delayed for a day or so. Start 24 hour Claritin OTC or benadryl to help with this. If you note any increasing redness or tenderness of the area, take the Keflex as directed.   Follow-up if not resolving.

## 2019-07-29 NOTE — Progress Notes (Signed)
Patient presents to clinic today c/o insect bite/sting to medial left thigh occurring yesterday while doing some yard work.  Did not see insect but notes it felt like a wasp sting.  Has noted some blistering at the site of pain with some surrounding redness.  Notes itching in that area.  Denies fever, chills, shortness of breath or tongue swelling.  Denies rash elsewhere.  Denies any hardness or excessive tenderness in the area.  Denies drainage from the site..   Past Medical History:  Diagnosis Date  . Allergic rhinitis, cause unspecified 03/12/2012  . Allergy    SEASONAL  . Anxiety   . Diabetes ([place]) 06/26/2008   Centricity Description: DM Qualifier: Diagnosis of  By: Jenny Reichmann MD, Hunt Oris  Centricity Description: DIABETES MELLITUS, TYPE II Qualifier: Diagnosis of  By: Jenny Reichmann MD, Hunt Oris   . DIVERTICULOSIS, COLON 07/05/2008  . DM w/o Complication Type II 05/30/6294  . HYPERLIPIDEMIA 06/27/2008  . HYPERTENSION 06/27/2008  . Kidney stones   . Mini stroke ([place])   . OBSTRUCTIVE SLEEP APNEA 06/26/2008  . Sleep apnea    CPAP  . Stroke [facility])    MINI    Current Outpatient Medications on File Prior to Visit  Medication Sig Dispense Refill  . amLODipine (NORVASC) 10 MG tablet TAKE 1 TABLET(10 MG) BY MOUTH DAILY 90 tablet 1  . aspirin 325 MG tablet Take 325 mg by mouth daily.      [name] atorvastatin (LIPITOR) 10 MG tablet Take 1 tablet (10 mg total) by mouth daily. 90 tablet 1  . loratadine (CLARITIN) 10 MG tablet Take 1 tablet (10 mg total) by mouth as needed. 90 tablet 3  . losartan (COZAAR) 100 MG tablet TAKE 1 TABLET(100 MG) BY MOUTH DAILY 90 tablet 1  . metFORMIN (GLUCOPHAGE) 500 MG tablet Take 1 tablet (500 mg total) by mouth daily with breakfast. 90 tablet 3  . Omega-3 Fatty Acids (FISH OIL) 500 MG CAPS Take by mouth 2 (two) times daily.      . pantoprazole (PROTONIX) 40 MG tablet Take 1 tablet (40 mg total) by mouth daily before breakfast. 90 tablet 3  . silver sulfADIAZINE (SILVADENE) 1 % cream Apply 1  application topically daily. 50 g 1   No current facility-administered medications on file prior to visit.     Allergies  Allergen Reactions  . Penicillins     Family History  Problem Relation Age of Onset  . Heart disease Father        MI-smoker  . Cancer Maternal Grandfather        type unknown  . Cancer Maternal Grandmother        type unknown  . Emphysema Paternal Uncle     Social History   Socioeconomic History  . Marital status: Married    Spouse name: Not on file  . Number of children: 2  . Years of education: Not on file  . Highest education level: Not on file  Occupational History  . Occupation: Dance movement psychotherapist: [employer]  Social Needs  . Financial resource strain: Not on file  . Food insecurity    Worry: Not on file    Inability: Not on file  . Transportation needs    Medical: Not on file    Non-medical: Not on file  Tobacco Use  . Smoking status: Never Smoker  . Smokeless tobacco: Never Used  Substance and Sexual Activity  . Alcohol use: Yes    Alcohol/week:  0.0 standard drinks    Comment: 0-2 per day  . Drug use: No  . Sexual activity: Not on file  Lifestyle  . Physical activity    Days per week: Not on file    Minutes per session: Not on file  . Stress: Not on file  Relationships  . Social Musicianconnections    Talks on phone: Not on file    Gets together: Not on file    Attends religious service: Not on file    Active member of club or organization: Not on file    Attends meetings of clubs or organizations: Not on file    Relationship status: Not on file  Other Topics Concern  . 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 He's married 2 daughters. 0-2 alcoholic drinks a day 0-2 caffeinated beverages a day.04/18/2016    Review of Systems - See HPI.  All other ROS are negative.  BP 140/80   Pulse 78   Temp 98.8 F (37.1 C)  (Skin)   Resp 16   Ht 6\' 3"  (1.905 m)   Wt 299 lb (135.6 kg)   SpO2 98%   BMI 37.37 kg/m   Physical Exam Vitals signs reviewed.  Constitutional:      Appearance: Normal appearance.  HENT:     Head: Normocephalic and atraumatic.  Neck:     Musculoskeletal: Neck supple.  Cardiovascular:     Rate and Rhythm: Normal rate and regular rhythm.  Pulmonary:     Effort: Pulmonary effort is normal.  Skin:      Neurological:     Mental Status: He is alert.     Recent Results (from the past 2160 hour(s))  PSA     Status: None   Collection Time: 06/14/19  9:13 AM  Result Value Ref Range   PSA 1.06 0.10 - 4.00 ng/mL    Comment: Test performed using Access Hybritech PSA Assay, a parmagnetic partical, chemiluminecent immunoassay.  TSH     Status: None   Collection Time: 06/14/19  9:13 AM  Result Value Ref Range   TSH 2.00 0.35 - 4.50 uIU/mL  Lipid panel     Status: None   Collection Time: 06/14/19  9:13 AM  Result Value Ref Range   Cholesterol 121 0 - 200 mg/dL    Comment: ATP III Classification       Desirable:  < 200 mg/dL               Borderline High:  200 - 239 mg/dL          High:  > = 161240 mg/dL   Triglycerides 09.674.0 0.0 - 149.0 mg/dL    Comment: Normal:  <045<150 mg/dLBorderline High:  150 - 199 mg/dL   HDL 40.9839.10 >11.91>39.00 mg/dL   VLDL 47.814.8 0.0 - 29.540.0 mg/dL   LDL Cholesterol 67 0 - 99 mg/dL   Total CHOL/HDL Ratio 3     Comment:                Men          Women1/2 Average Risk     3.4          3.3Average Risk          5.0          4.42X Average Risk          9.6  7.13X Average Risk          15.0          11.0                       NonHDL 81.51     Comment: NOTE:  Non-HDL goal should be 30 mg/dL higher than patient's LDL goal (i.e. LDL goal of < 70 mg/dL, would have non-HDL goal of < 100 mg/dL)  Hepatic function panel     Status: None   Collection Time: 06/14/19  9:13 AM  Result Value Ref Range   Total Bilirubin 0.8 0.2 - 1.2 mg/dL   Bilirubin, Direct 0.2 0.0 - 0.3 mg/dL    Alkaline Phosphatase 61 39 - 117 U/L   AST 28 0 - 37 U/L   ALT 32 0 - 53 U/L   Total Protein 6.3 6.0 - 8.3 g/dL   Albumin 4.5 3.5 - 5.2 g/dL  CBC with Differential/Platelet     Status: Abnormal   Collection Time: 06/14/19  9:13 AM  Result Value Ref Range   WBC 4.8 4.0 - 10.5 K/uL   RBC 4.93 4.22 - 5.81 Mil/uL   Hemoglobin 14.2 13.0 - 17.0 g/dL   HCT 81.143.1 91.439.0 - 78.252.0 %   MCV 87.5 78.0 - 100.0 fl   MCHC 33.0 30.0 - 36.0 g/dL   RDW 95.613.3 21.311.5 - 08.615.5 %   Platelets 165.0 150.0 - 400.0 K/uL   Neutrophils Relative % 55.3 43.0 - 77.0 %   Lymphocytes Relative 27.3 12.0 - 46.0 %   Monocytes Relative 10.5 3.0 - 12.0 %   Eosinophils Relative 5.9 (H) 0.0 - 5.0 %   Basophils Relative 1.0 0.0 - 3.0 %   Neutro Abs 2.7 1.4 - 7.7 K/uL   Lymphs Abs 1.3 0.7 - 4.0 K/uL   Monocytes Absolute 0.5 0.1 - 1.0 K/uL   Eosinophils Absolute 0.3 0.0 - 0.7 K/uL   Basophils Absolute 0.0 0.0 - 0.1 K/uL  Basic metabolic panel     Status: Abnormal   Collection Time: 06/14/19  9:13 AM  Result Value Ref Range   Sodium 138 135 - 145 mEq/L   Potassium 4.1 3.5 - 5.1 mEq/L   Chloride 103 96 - 112 mEq/L   CO2 28 19 - 32 mEq/L   Glucose, Bld 138 (H) 70 - 99 mg/dL   BUN 13 6 - 23 mg/dL   Creatinine, Ser 5.780.88 0.40 - 1.50 mg/dL   Calcium 8.8 8.4 - 46.910.5 mg/dL   GFR 62.9586.68 >28.41>60.00 mL/min  Urinalysis, Routine w reflex microscopic     Status: None   Collection Time: 06/14/19 10:42 AM  Result Value Ref Range   Color, Urine YELLOW Yellow;Lt. Yellow;Straw;Dark Yellow;Amber;Green;Red;Brown   APPearance CLEAR Clear;Turbid;Slightly Cloudy;Cloudy   Specific Gravity, Urine 1.025 1.000 - 1.030   pH 6.0 5.0 - 8.0   Total Protein, Urine NEGATIVE Negative   Urine Glucose NEGATIVE Negative   Ketones, ur NEGATIVE Negative   Bilirubin Urine NEGATIVE Negative   Hgb urine dipstick NEGATIVE Negative   Urobilinogen, UA 0.2 0.0 - 1.0   Leukocytes,Ua NEGATIVE Negative   Nitrite NEGATIVE Negative   WBC, UA 0-2/hpf 0-2/hpf   RBC / HPF  none seen 0-2/hpf   Squamous Epithelial / LPF Rare(0-4/hpf) Rare(0-4/hpf)  POCT glycosylated hemoglobin (Hb A1C)     Status: Abnormal   Collection Time: 06/15/19 10:50 AM  Result Value Ref Range   Hemoglobin A1C 5.9 (A) 4.0 - 5.6 %  HbA1c POC (<> result, manual entry)     HbA1c, POC (prediabetic range)     HbA1c, POC (controlled diabetic range)      Assessment/Plan: 1. Insect bite of left thigh, initial encounter Moderate histamine response noted on examination today.  We will have him start nondrowsy antihistamine and cold compresses to the area.  Giving the blister at site of bite, question if this was an ant bite.  No sign of cellulitis at present but the symptoms were reviewed with patient.  Patient was given a prescription for doxycycline to use as directed if needed, if the symptoms develop.  Strict return precautions reviewed with patient.   Piedad ClimesWilliam Cody Ishaq Maffei, PA-C

## 2019-09-05 ENCOUNTER — Other Ambulatory Visit: Payer: Self-pay | Admitting: Internal Medicine

## 2019-09-05 MED ORDER — ATORVASTATIN CALCIUM 10 MG PO TABS: 10.0000 mg | ORAL_TABLET | Freq: Every day | ORAL | 1 refills | Status: DC

## 2019-09-05 NOTE — Telephone Encounter (Signed)
Medication: atorvastatin (LIPITOR) 10 MG tablet [[id number]]   Pharmacy:  [facility] [place], [place] AT [address] [phone] (Phone) [phone] (Fax)   Pt also would like to go back to the ER of metformin/requesting call back from nurse. Please advise

## 2019-11-30 ENCOUNTER — Telehealth: Payer: Self-pay | Admitting: Internal Medicine

## 2019-11-30 NOTE — Telephone Encounter (Signed)
rx refill amLODipine (NORVASC) 10 MG tablet  atorvastatin (LIPITOR) 10 MG tablet  doxycycline (VIBRAMYCIN) 100 MG capsule PHARMACY [id number] - [place], [place] - [address] AT [address] [phone] (Phone) [phone] (Fax)

## 2019-12-01 MED ORDER — ATORVASTATIN CALCIUM 10 MG PO TABS: 10.0000 mg | ORAL_TABLET | Freq: Every day | ORAL | 1 refills | Status: DC

## 2019-12-01 MED ORDER — AMLODIPINE BESYLATE 10 MG PO TABS: ORAL_TABLET | ORAL | 1 refills | Status: DC

## 2019-12-06 ENCOUNTER — Other Ambulatory Visit (INDEPENDENT_AMBULATORY_CARE_PROVIDER_SITE_OTHER): Payer: Medicare Other

## 2019-12-06 DIAGNOSIS — E119 Type 2 diabetes mellitus without complications: Secondary | ICD-10-CM | POA: Diagnosis not present

## 2019-12-06 LAB — LIPID PANEL
Cholesterol: 104 mg/dL (ref 0–200)
HDL: 37.7 mg/dL — ABNORMAL LOW (ref >39.00)
LDL Cholesterol: 44 mg/dL (ref 0–99)
NonHDL: 66.27
Total CHOL/HDL Ratio: 3
Triglycerides: 113 mg/dL (ref 0.0–149.0)
VLDL: 22.6 mg/dL (ref 0.0–40.0)

## 2019-12-06 LAB — BASIC METABOLIC PANEL
BUN: 14 mg/dL (ref 6–23)
CO2: 28 mEq/L (ref 19–32)
Calcium: 9 mg/dL (ref 8.4–10.5)
Chloride: 103 mEq/L (ref 96–112)
Creatinine, Ser: 0.91 mg/dL (ref 0.40–1.50)
GFR: 83.27 mL/min (ref >60.00)
Glucose, Bld: 152 mg/dL — ABNORMAL HIGH (ref 70–99)
Potassium: 4.1 mEq/L (ref 3.5–5.1)
Sodium: 137 mEq/L (ref 135–145)

## 2019-12-06 LAB — HEMOGLOBIN A1C: Hgb A1c MFr Bld: 6 % (ref 4.6–6.5)

## 2019-12-06 LAB — HEPATIC FUNCTION PANEL
ALT: 26 U/L (ref 0–53)
AST: 23 U/L (ref 0–37)
Albumin: 4.4 g/dL (ref 3.5–5.2)
Alkaline Phosphatase: 58 U/L (ref 39–117)
Bilirubin, Direct: 0.1 mg/dL (ref 0.0–0.3)
Total Bilirubin: 0.5 mg/dL (ref 0.2–1.2)
Total Protein: 6.8 g/dL (ref 6.0–8.3)

## 2019-12-08 ENCOUNTER — Ambulatory Visit (INDEPENDENT_AMBULATORY_CARE_PROVIDER_SITE_OTHER): Payer: Medicare Other | Admitting: Internal Medicine

## 2019-12-08 ENCOUNTER — Encounter: Payer: Self-pay | Admitting: Internal Medicine

## 2019-12-08 ENCOUNTER — Other Ambulatory Visit: Payer: Self-pay

## 2019-12-08 VITALS — BP 126/78 | HR 88 | Temp 97.8°F | Ht 75.0 in | Wt 278.0 lb

## 2019-12-08 DIAGNOSIS — I10 Essential (primary) hypertension: Secondary | ICD-10-CM | POA: Diagnosis not present

## 2019-12-08 DIAGNOSIS — E119 Type 2 diabetes mellitus without complications: Secondary | ICD-10-CM | POA: Diagnosis not present

## 2019-12-08 DIAGNOSIS — N320 Bladder-neck obstruction: Secondary | ICD-10-CM

## 2019-12-08 DIAGNOSIS — E785 Hyperlipidemia, unspecified: Secondary | ICD-10-CM

## 2019-12-08 MED ORDER — LOSARTAN POTASSIUM 100 MG PO TABS: ORAL_TABLET | ORAL | 3 refills | Status: DC

## 2019-12-08 MED ORDER — METFORMIN HCL ER 500 MG PO TB24: 500.0000 mg | ORAL_TABLET | Freq: Every day | ORAL | 3 refills | Status: DC

## 2019-12-08 NOTE — Assessment & Plan Note (Signed)
stable overall by history and exam, recent data reviewed with pt, and pt to continue medical treatment as before,  to f/u any worsening symptoms or concerns  

## 2019-12-08 NOTE — Progress Notes (Signed)
Subjective:    Patient ID: Danny [name], male    DOB: [birth year], 68 y.o.   MRN: [id number]  HPI  Here to f/u; overall doing ok,  Pt denies chest pain, increasing sob or doe, wheezing, orthopnea, PND, increased LE swelling, palpitations, dizziness or syncope.  Pt denies new neurological symptoms such as new headache, or facial or extremity weakness or numbness.  Pt denies polydipsia, polyuria, or low sugar episode.  Pt states overall good compliance with meds, mostly trying to follow appropriate diet, with wt overall stable, and Walking 5 miles most days. Lost 20 lbs with better diet and walking.  Wt Readings from Last 3 Encounters:  12/08/19 278 lb (126.1 kg)  07/29/19 299 lb (135.6 kg)  06/15/19 298 lb (135.2 kg)   Past Medical History:  Diagnosis Date  . Allergic rhinitis, cause unspecified 03/12/2012  . Allergy    SEASONAL  . Anxiety   . Diabetes ([facility]) 06/26/2008   Centricity Description: DM Qualifier: Diagnosis of  By: Jonny Ruiz MD, Len Blalock  Centricity Description: DIABETES MELLITUS, TYPE II Qualifier: Diagnosis of  By: Jonny Ruiz MD, Len Blalock   . DIVERTICULOSIS, COLON 07/05/2008  . DM w/o Complication Type II 06/26/2008  . HYPERLIPIDEMIA 06/27/2008  . HYPERTENSION 06/27/2008  . Kidney stones   . Mini stroke ([facility])   . OBSTRUCTIVE SLEEP APNEA 06/26/2008  . Sleep apnea    CPAP  . Stroke [facility])    MINI   Past Surgical History:  Procedure Laterality Date  . COLONOSCOPY    . COSMETIC SURGERY    . NOSE SURGERY  1970   cosmetic  . TONSILLECTOMY      reports that he has never smoked. He has never used smokeless tobacco. He reports current alcohol use. He reports that he does not use drugs. family history includes Cancer in his maternal grandfather and maternal grandmother; Emphysema in his paternal uncle; Heart disease in his father. Allergies  Allergen Reactions  . Penicillins    Current Outpatient Medications on File Prior to Visit  Medication Sig Dispense Refill  . amLODipine (NORVASC) 10  MG tablet TAKE 1 TABLET(10 MG) BY MOUTH DAILY 90 tablet 1  . aspirin 325 MG tablet Take 325 mg by mouth daily.      [name] atorvastatin (LIPITOR) 10 MG tablet Take 1 tablet (10 mg total) by mouth daily. 90 tablet 1  . loratadine (CLARITIN) 10 MG tablet Take 1 tablet (10 mg total) by mouth as needed. 90 tablet 3  . Omega-3 Fatty Acids (FISH OIL) 500 MG CAPS Take by mouth 2 (two) times daily.      . pantoprazole (PROTONIX) 40 MG tablet Take 1 tablet (40 mg total) by mouth daily before breakfast. 90 tablet 3  . silver sulfADIAZINE (SILVADENE) 1 % cream Apply 1 application topically daily. 50 g 1   No current facility-administered medications on file prior to visit.   Review of Systems  Constitutional: Negative for other unusual diaphoresis or sweats HENT: Negative for ear discharge or swelling Eyes: Negative for other worsening visual disturbances Respiratory: Negative for stridor or other swelling  Gastrointestinal: Negative for worsening distension or other blood Genitourinary: Negative for retention or other urinary change Musculoskeletal: Negative for other MSK pain or swelling Skin: Negative for color change or other new lesions Neurological: Negative for worsening tremors and other numbness  Psychiatric/Behavioral: Negative for worsening agitation or other fatigue All otherwise neg per pt     Objective:   Physical Exam BP 126/78  Pulse 88   Temp 97.8 F (36.6 C) (Oral)   Ht 6\' 3"  (1.905 m)   Wt 278 lb (126.1 kg)   SpO2 96%   BMI 34.75 kg/m  VS noted,  Constitutional: Pt appears in NAD HENT: Head: NCAT.  Right Ear: External ear normal.  Left Ear: External ear normal.  Eyes: . Pupils are equal, round, and reactive to light. Conjunctivae and EOM are normal Nose: without d/c or deformity Neck: Neck supple. Gross normal ROM Cardiovascular: Normal rate and regular rhythm.   Pulmonary/Chest: Effort normal and breath sounds without rales or wheezing.  Abd:  Soft, NT, ND, + BS, no  organomegaly Neurological: Pt is alert. At baseline orientation, motor grossly intact Skin: Skin is warm. No rashes, other new lesions, no LE edema Psychiatric: Pt behavior is normal without agitation  All otherwise neg per pt Lab Results  Component Value Date   WBC 4.8 06/14/2019   HGB 14.2 06/14/2019   HCT 43.1 06/14/2019   PLT 165.0 06/14/2019   GLUCOSE 152 (H) 12/06/2019   CHOL 104 12/06/2019   TRIG 113.0 12/06/2019   HDL 37.70 (L) 12/06/2019   LDLCALC 44 12/06/2019   ALT 26 12/06/2019   AST 23 12/06/2019   NA 137 12/06/2019   K 4.1 12/06/2019   CL 103 12/06/2019   CREATININE 0.91 12/06/2019   BUN 14 12/06/2019   CO2 28 12/06/2019   TSH 2.00 06/14/2019   PSA 1.06 06/14/2019   HGBA1C 6.0 12/06/2019   MICROALBUR <0.7 04/06/2018       Assessment & Plan:

## 2019-12-08 NOTE — Patient Instructions (Signed)
Please continue all other medications as before, and refills have been done if requested.  Please have the pharmacy call with any other refills you may need.  Please continue your efforts at being more active, low cholesterol diet, and weight control..  Please keep your appointments with your specialists as you may have planned  Please return in 6 months, or sooner if needed, with Lab testing done 3-5 days before  

## 2020-01-13 ENCOUNTER — Encounter: Payer: Self-pay | Admitting: Internal Medicine

## 2020-01-20 ENCOUNTER — Ambulatory Visit: Payer: Medicare Other

## 2020-01-24 ENCOUNTER — Encounter: Payer: Self-pay | Admitting: Internal Medicine

## 2020-01-27 ENCOUNTER — Ambulatory Visit: Payer: Medicare Other

## 2020-01-31 ENCOUNTER — Ambulatory Visit: Payer: Medicare Other

## 2020-05-28 ENCOUNTER — Other Ambulatory Visit: Payer: Self-pay | Admitting: Internal Medicine

## 2020-05-28 NOTE — Telephone Encounter (Signed)
Please refill as per office routine med refill policy (all routine meds refilled for 3 mo or monthly per pt preference up to one year from last visit, then month to month grace period for 3 mo, then further med refills will have to be denied)  

## 2020-06-05 ENCOUNTER — Telehealth: Payer: Self-pay | Admitting: Internal Medicine

## 2020-06-05 NOTE — Telephone Encounter (Signed)
New message:   Pt is calling and states he would like labs put in the system for him to have the results before his appt on 06/08/20. Please advise.

## 2020-06-06 NOTE — Telephone Encounter (Signed)
Pt has been informed he already has pended lab orders via VM

## 2020-06-07 ENCOUNTER — Other Ambulatory Visit (INDEPENDENT_AMBULATORY_CARE_PROVIDER_SITE_OTHER): Payer: Medicare Other

## 2020-06-07 ENCOUNTER — Other Ambulatory Visit: Payer: Self-pay

## 2020-06-07 DIAGNOSIS — E119 Type 2 diabetes mellitus without complications: Secondary | ICD-10-CM

## 2020-06-07 DIAGNOSIS — N320 Bladder-neck obstruction: Secondary | ICD-10-CM | POA: Diagnosis not present

## 2020-06-07 LAB — URINALYSIS, ROUTINE W REFLEX MICROSCOPIC
Bilirubin Urine: NEGATIVE
Hgb urine dipstick: NEGATIVE
Leukocytes,Ua: NEGATIVE
Nitrite: NEGATIVE
RBC / HPF: NONE SEEN (ref 0–?)
Specific Gravity, Urine: 1.025 (ref 1.000–1.030)
Total Protein, Urine: NEGATIVE
Urine Glucose: NEGATIVE
Urobilinogen, UA: 0.2 (ref 0.0–1.0)
pH: 5.5 (ref 5.0–8.0)

## 2020-06-07 LAB — HEPATIC FUNCTION PANEL
ALT: 29 U/L (ref 0–53)
AST: 21 U/L (ref 0–37)
Albumin: 4.8 g/dL (ref 3.5–5.2)
Alkaline Phosphatase: 65 U/L (ref 39–117)
Bilirubin, Direct: 0.1 mg/dL (ref 0.0–0.3)
Total Bilirubin: 0.5 mg/dL (ref 0.2–1.2)
Total Protein: 7 g/dL (ref 6.0–8.3)

## 2020-06-07 LAB — BASIC METABOLIC PANEL
BUN: 17 mg/dL (ref 6–23)
CO2: 25 mEq/L (ref 19–32)
Calcium: 9.3 mg/dL (ref 8.4–10.5)
Chloride: 106 mEq/L (ref 96–112)
Creatinine, Ser: 0.87 mg/dL (ref 0.40–1.50)
GFR: 87.57 mL/min (ref >60.00)
Glucose, Bld: 99 mg/dL (ref 70–99)
Potassium: 4.2 mEq/L (ref 3.5–5.1)
Sodium: 139 mEq/L (ref 135–145)

## 2020-06-07 LAB — LIPID PANEL
Cholesterol: 115 mg/dL (ref 0–200)
HDL: 39.6 mg/dL (ref >39.00)
LDL Cholesterol: 45 mg/dL (ref 0–99)
NonHDL: 75.71
Total CHOL/HDL Ratio: 3
Triglycerides: 154 mg/dL — ABNORMAL HIGH (ref 0.0–149.0)
VLDL: 30.8 mg/dL (ref 0.0–40.0)

## 2020-06-07 LAB — CBC WITH DIFFERENTIAL/PLATELET
Basophils Absolute: 0 10*3/uL (ref 0.0–0.1)
Basophils Relative: 0.7 % (ref 0.0–3.0)
Eosinophils Absolute: 0.3 10*3/uL (ref 0.0–0.7)
Eosinophils Relative: 4.7 % (ref 0.0–5.0)
HCT: 44.5 % (ref 39.0–52.0)
Hemoglobin: 14.7 g/dL (ref 13.0–17.0)
Lymphocytes Relative: 21 % (ref 12.0–46.0)
Lymphs Abs: 1.4 10*3/uL (ref 0.7–4.0)
MCHC: 33.2 g/dL (ref 30.0–36.0)
MCV: 87.7 fl (ref 78.0–100.0)
Monocytes Absolute: 0.6 10*3/uL (ref 0.1–1.0)
Monocytes Relative: 8.7 % (ref 3.0–12.0)
Neutro Abs: 4.4 10*3/uL (ref 1.4–7.7)
Neutrophils Relative %: 64.9 % (ref 43.0–77.0)
Platelets: 196 10*3/uL (ref 150.0–400.0)
RBC: 5.07 Mil/uL (ref 4.22–5.81)
RDW: 13.1 % (ref 11.5–15.5)
WBC: 6.7 10*3/uL (ref 4.0–10.5)

## 2020-06-07 LAB — PSA: PSA: 1.22 ng/mL (ref 0.10–4.00)

## 2020-06-07 LAB — TSH: TSH: 2.22 u[IU]/mL (ref 0.35–4.50)

## 2020-06-07 LAB — HEMOGLOBIN A1C: Hgb A1c MFr Bld: 6.6 % — ABNORMAL HIGH (ref 4.6–6.5)

## 2020-06-08 ENCOUNTER — Ambulatory Visit (INDEPENDENT_AMBULATORY_CARE_PROVIDER_SITE_OTHER): Payer: Medicare Other | Admitting: Internal Medicine

## 2020-06-08 ENCOUNTER — Encounter: Payer: Self-pay | Admitting: Internal Medicine

## 2020-06-08 VITALS — BP 150/82 | HR 79 | Temp 98.2°F | Ht 75.0 in | Wt 294.0 lb

## 2020-06-08 DIAGNOSIS — M79652 Pain in left thigh: Secondary | ICD-10-CM | POA: Diagnosis not present

## 2020-06-08 DIAGNOSIS — I10 Essential (primary) hypertension: Secondary | ICD-10-CM

## 2020-06-08 DIAGNOSIS — E119 Type 2 diabetes mellitus without complications: Secondary | ICD-10-CM

## 2020-06-08 DIAGNOSIS — E785 Hyperlipidemia, unspecified: Secondary | ICD-10-CM

## 2020-06-08 LAB — MICROALBUMIN / CREATININE URINE RATIO
Creatinine,U: 251.9 mg/dL
Microalb Creat Ratio: 0.5 mg/g (ref 0.0–30.0)
Microalb, Ur: 1.4 mg/dL (ref 0.0–1.9)

## 2020-06-08 NOTE — Patient Instructions (Signed)
Please continue all other medications as before, and refills have been done if requested.  Please have the pharmacy call with any other refills you may need.  Please continue your efforts at being more active, low cholesterol diet, and weight control.  You are otherwise up to date with prevention measures today.  Please keep your appointments with your specialists as you may have planned  You will be contacted regarding the referral for: Sports medicine at the first floor (though you can make an appt as you leave as well)  Please remember to sign up for MyChart if you have not done so, as this will be important to you in the future with finding out test results, communicating by private email, and scheduling acute appointments online when needed.  Please make an Appointment to return in 6 months, or sooner if needed

## 2020-06-08 NOTE — Progress Notes (Addendum)
Subjective:    Patient ID: Danny [name], male    DOB: Nov 01, 1951, 69 y.o.   MRN: [id number]  HPI  Here to f/u; overall doing ok,  Pt denies chest pain, increasing sob or doe, wheezing, orthopnea, PND, increased LE swelling, palpitations, dizziness or syncope.  Pt denies new neurological symptoms such as new headache, or facial or extremity weakness or numbness.  Pt denies polydipsia, polyuria, or low sugar episode.  Pt states overall good compliance with meds, mostly trying to follow appropriate diet, with wt overall stable,  but little exercise however. Plans to have eye exam soon.  Plans to restart back to the gym soon, did have back pain for about 3 mo but now resolved.  Pt continues to have > 3 wks left thigh pain and swelling anteriorly but not to knee or leg distal, keeping him from getting back tot the gym.  bP At home < 140/90 BP Readings from Last 3 Encounters:  06/08/20 (!) 150/82  12/08/19 126/78  07/29/19 140/80   Past Medical History:  Diagnosis Date  . Allergic rhinitis, cause unspecified 03/12/2012  . Allergy    SEASONAL  . Anxiety   . Diabetes ([place]) 06/26/2008   Centricity Description: DM Qualifier: Diagnosis of  By: Jenny Reichmann MD, Hunt Oris  Centricity Description: DIABETES MELLITUS, TYPE II Qualifier: Diagnosis of  By: Jenny Reichmann MD, Hunt Oris   . DIVERTICULOSIS, COLON 07/05/2008  . DM w/o Complication Type II 02/23/3613  . HYPERLIPIDEMIA 06/27/2008  . HYPERTENSION 06/27/2008  . Kidney stones   . Mini stroke ([place])   . OBSTRUCTIVE SLEEP APNEA 06/26/2008  . Sleep apnea    CPAP  . Stroke [facility])    MINI   Past Surgical History:  Procedure Laterality Date  . COLONOSCOPY    . COSMETIC SURGERY    . NOSE SURGERY  1970   cosmetic  . TONSILLECTOMY      reports that he has never smoked. He has never used smokeless tobacco. He reports current alcohol use. He reports that he does not use drugs. family history includes Cancer in his maternal grandfather and maternal grandmother; Emphysema in his  paternal uncle; Heart disease in his father. Allergies  Allergen Reactions  . Penicillins    Current Outpatient Medications on File Prior to Visit  Medication Sig Dispense Refill  . amLODipine (NORVASC) 10 MG tablet TAKE 1 TABLET(10 MG) BY MOUTH DAILY 90 tablet 1  . aspirin 325 MG tablet Take 325 mg by mouth daily.      [name] atorvastatin (LIPITOR) 10 MG tablet TAKE 1 TABLET(10 MG) BY MOUTH DAILY 90 tablet 1  . loratadine (CLARITIN) 10 MG tablet Take 1 tablet (10 mg total) by mouth as needed. 90 tablet 3  . losartan (COZAAR) 100 MG tablet TAKE 1 TABLET(100 MG) BY MOUTH DAILY 90 tablet 3  . metFORMIN (GLUCOPHAGE-XR) 500 MG 24 hr tablet Take 1 tablet (500 mg total) by mouth daily with breakfast. 90 tablet 3  . Omega-3 Fatty Acids (FISH OIL) 500 MG CAPS Take by mouth 2 (two) times daily.      . pantoprazole (PROTONIX) 40 MG tablet Take 1 tablet (40 mg total) by mouth daily before breakfast. 90 tablet 3  . silver sulfADIAZINE (SILVADENE) 1 % cream Apply 1 application topically daily. 50 g 1   No current facility-administered medications on file prior to visit.   Review of Systems All otherwise neg per pt    Objective:   Physical Exam BP (!) 150/82 (BP  Location: Left Arm, Patient Position: Sitting, Cuff Size: Large)   Pulse 79   Temp 98.2 F (36.8 C) (Oral)   Ht 6\' 3"  (1.905 m)   Wt 294 lb (133.4 kg)   SpO2 96%   BMI 36.75 kg/m  VS noted,  Constitutional: Pt appears in NAD HENT: Head: NCAT.  Right Ear: External ear normal.  Left Ear: External ear normal.  Eyes: . Pupils are equal, round, and reactive to light. Conjunctivae and EOM are normal Nose: without d/c or deformity Neck: Neck supple. Gross normal ROM Cardiovascular: Normal rate and regular rhythm.   Pulmonary/Chest: Effort normal and breath sounds without rales or wheezing.  Abd:  Soft, NT, ND, + BS, no organomegaly Spine nontender in midline Left upper leg with mild diffuse swelling to quads only it seem, without swelling  or tender to knee or distal leg, no rash Neurological: Pt is alert. At baseline orientation, motor grossly intact Skin: Skin is warm. No rashes, other new lesions, no LE edema Psychiatric: Pt behavior is normal without agitation  All otherwise neg per pt Lab Results  Component Value Date   WBC 6.7 06/07/2020   HGB 14.7 06/07/2020   HCT 44.5 06/07/2020   PLT 196.0 06/07/2020   GLUCOSE 99 06/07/2020   CHOL 115 06/07/2020   TRIG 154.0 (H) 06/07/2020   HDL 39.60 06/07/2020   LDLCALC 45 06/07/2020   ALT 29 06/07/2020   AST 21 06/07/2020   NA 139 06/07/2020   K 4.2 06/07/2020   CL 106 06/07/2020   CREATININE 0.87 06/07/2020   BUN 17 06/07/2020   CO2 25 06/07/2020   TSH 2.22 06/07/2020   PSA 1.22 06/07/2020   HGBA1C 6.6 (H) 06/07/2020   MICROALBUR 1.4 06/07/2020         Assessment & Plan:

## 2020-06-09 ENCOUNTER — Encounter: Payer: Self-pay | Admitting: Internal Medicine

## 2020-06-09 NOTE — Assessment & Plan Note (Signed)
stable overall by history and exam, recent data reviewed with pt, and pt to continue medical treatment as before,  to f/u any worsening symptoms or concerns  

## 2020-06-09 NOTE — Assessment & Plan Note (Signed)
Suspect msk strain due to difference in gait after recent episode low back now itself recently resolved, cont tylenol, consider sport med if not improved

## 2020-06-09 NOTE — Assessment & Plan Note (Addendum)
stable overall by history and exam, recent data reviewed with pt, and pt to continue medical treatment as before,  to f/u any worsening symptoms or concerns  I spent 31 minutes in preparing to see the patient by review of recent labs, imaging and procedures, obtaining and reviewing separately obtained history, communicating with the patient and family or caregiver, ordering medications, tests or procedures, and documenting clinical information in the EHR including the differential Dx, treatment, and any further evaluation and other management of dm, htn, hld, left thigh pain

## 2020-07-05 ENCOUNTER — Ambulatory Visit: Payer: Medicare Other | Admitting: Internal Medicine

## 2020-07-06 ENCOUNTER — Other Ambulatory Visit: Payer: Self-pay

## 2020-07-06 ENCOUNTER — Ambulatory Visit (INDEPENDENT_AMBULATORY_CARE_PROVIDER_SITE_OTHER): Payer: Medicare Other | Admitting: Internal Medicine

## 2020-07-06 ENCOUNTER — Encounter: Payer: Self-pay | Admitting: Internal Medicine

## 2020-07-06 VITALS — BP 160/92 | HR 79 | Temp 98.7°F | Ht 75.0 in | Wt 294.0 lb

## 2020-07-06 DIAGNOSIS — E785 Hyperlipidemia, unspecified: Secondary | ICD-10-CM | POA: Diagnosis not present

## 2020-07-06 DIAGNOSIS — I10 Essential (primary) hypertension: Secondary | ICD-10-CM

## 2020-07-06 DIAGNOSIS — E119 Type 2 diabetes mellitus without complications: Secondary | ICD-10-CM

## 2020-07-06 DIAGNOSIS — F419 Anxiety disorder, unspecified: Secondary | ICD-10-CM

## 2020-07-06 MED ORDER — CITALOPRAM HYDROBROMIDE 20 MG PO TABS: 20.0000 mg | ORAL_TABLET | Freq: Every day | ORAL | 3 refills | Status: DC

## 2020-07-06 NOTE — Patient Instructions (Signed)
.  Please take all new medication as prescribed - the celexa 20 mg per day  Please continue all other medications as before, and refills have been done if requested.  Please have the pharmacy call with any other refills you may need.  Please keep your appointments with your specialists as you may have planned   

## 2020-07-06 NOTE — Progress Notes (Signed)
Subjective:    Patient ID: Danny [name], male    DOB: 04-18-1951, 69 y.o.   MRN: [id number]  HPI  Here to f/u; overall doing ok,  Pt denies chest pain, increasing sob or doe, wheezing, orthopnea, PND, increased LE swelling, palpitations, dizziness or syncope.  Pt denies new neurological symptoms such as new headache, or facial or extremity weakness or numbness.  Pt denies polydipsia, polyuria, or low sugar episode.  Pt states overall good compliance with meds, mostly trying to follow appropriate diet, with wt overall stable,  but little exercise however. Denies worsening depressive symptoms, suicidal ideation, but has ongoing and worsening anxiety for no specific reason it seems in the past month with several near panic.   Past Medical History:  Diagnosis Date  . Allergic rhinitis, cause unspecified 03/12/2012  . Allergy    SEASONAL  . Anxiety   . Diabetes (HCC) 06/26/2008   Centricity Description: DM Qualifier: Diagnosis of  By: Jonny Ruiz MD, Len Blalock  Centricity Description: DIABETES MELLITUS, TYPE II Qualifier: Diagnosis of  By: Jonny Ruiz MD, Len Blalock   . DIVERTICULOSIS, COLON 07/05/2008  . DM w/o Complication Type II 06/26/2008  . HYPERLIPIDEMIA 06/27/2008  . HYPERTENSION 06/27/2008  . Kidney stones   . Mini stroke (HCC)   . OBSTRUCTIVE SLEEP APNEA 06/26/2008  . Sleep apnea    CPAP  . Stroke [facility])    MINI   Past Surgical History:  Procedure Laterality Date  . COLONOSCOPY    . COSMETIC SURGERY    . NOSE SURGERY  1970   cosmetic  . TONSILLECTOMY      reports that he has never smoked. He has never used smokeless tobacco. He reports current alcohol use. He reports that he does not use drugs. family history includes Cancer in his maternal grandfather and maternal grandmother; Emphysema in his paternal uncle; Heart disease in his father. Allergies  Allergen Reactions  . Penicillins    Current Outpatient Medications on File Prior to Visit  Medication Sig Dispense Refill  . amLODipine (NORVASC) 10  MG tablet TAKE 1 TABLET(10 MG) BY MOUTH DAILY 90 tablet 1  . aspirin 325 MG tablet Take 325 mg by mouth daily.      [name] atorvastatin (LIPITOR) 10 MG tablet TAKE 1 TABLET(10 MG) BY MOUTH DAILY 90 tablet 1  . loratadine (CLARITIN) 10 MG tablet Take 1 tablet (10 mg total) by mouth as needed. 90 tablet 3  . losartan (COZAAR) 100 MG tablet TAKE 1 TABLET(100 MG) BY MOUTH DAILY 90 tablet 3  . metFORMIN (GLUCOPHAGE-XR) 500 MG 24 hr tablet Take 1 tablet (500 mg total) by mouth daily with breakfast. 90 tablet 3  . Omega-3 Fatty Acids (FISH OIL) 500 MG CAPS Take by mouth 2 (two) times daily.      . pantoprazole (PROTONIX) 40 MG tablet Take 1 tablet (40 mg total) by mouth daily before breakfast. 90 tablet 3  . silver sulfADIAZINE (SILVADENE) 1 % cream Apply 1 application topically daily. 50 g 1   No current facility-administered medications on file prior to visit.   Review of Systems All otherwise neg per pt     Objective:   Physical Exam BP (!) 160/92 (BP Location: Left Arm, Patient Position: Sitting, Cuff Size: Large)   Pulse 79   Temp 98.7 F (37.1 C) (Oral)   Ht 6\' 3"  (1.905 m)   Wt 294 lb (133.4 kg)   SpO2 95%   BMI 36.75 kg/m  VS noted,  Constitutional: Pt  appears in NAD HENT: Head: NCAT.  Right Ear: External ear normal.  Left Ear: External ear normal.  Eyes: . Pupils are equal, round, and reactive to light. Conjunctivae and EOM are normal Nose: without d/c or deformity Neck: Neck supple. Gross normal ROM Cardiovascular: Normal rate and regular rhythm.   Pulmonary/Chest: Effort normal and breath sounds without rales or wheezing.  Neurological: Pt is alert. At baseline orientation, motor grossly intact Skin: Skin is warm. No rashes, other new lesions, no LE edema Psychiatric: Pt behavior is normal without agitation 2+ nervous All otherwise neg per pt Lab Results  Component Value Date   WBC 6.7 06/07/2020   HGB 14.7 06/07/2020   HCT 44.5 06/07/2020   PLT 196.0 06/07/2020    GLUCOSE 99 06/07/2020   CHOL 115 06/07/2020   TRIG 154.0 (H) 06/07/2020   HDL 39.60 06/07/2020   LDLCALC 45 06/07/2020   ALT 29 06/07/2020   AST 21 06/07/2020   NA 139 06/07/2020   K 4.2 06/07/2020   CL 106 06/07/2020   CREATININE 0.87 06/07/2020   BUN 17 06/07/2020   CO2 25 06/07/2020   TSH 2.22 06/07/2020   PSA 1.22 06/07/2020   HGBA1C 6.6 (H) 06/07/2020   MICROALBUR 1.4 06/07/2020        Assessment & Plan:

## 2020-07-07 ENCOUNTER — Encounter: Payer: Self-pay | Admitting: Internal Medicine

## 2020-07-07 NOTE — Assessment & Plan Note (Signed)
stable overall by history and exam, recent data reviewed with pt, and pt to continue medical treatment as before,  to f/u any worsening symptoms or concerns  

## 2020-07-07 NOTE — Assessment & Plan Note (Signed)
Ok for celexa 20 qd, declines counseling referral

## 2020-07-07 NOTE — Assessment & Plan Note (Signed)

## 2020-11-25 ENCOUNTER — Other Ambulatory Visit: Payer: Self-pay | Admitting: Internal Medicine

## 2020-11-25 NOTE — Telephone Encounter (Signed)
Please refill as per office routine med refill policy (all routine meds refilled for 3 mo or monthly per pt preference up to one year from last visit, then month to month grace period for 3 mo, then further med refills will have to be denied)  

## 2020-12-03 ENCOUNTER — Other Ambulatory Visit: Payer: Self-pay | Admitting: Internal Medicine

## 2020-12-03 NOTE — Telephone Encounter (Signed)
Please refill as per office routine med refill policy (all routine meds refilled for 3 mo or monthly per pt preference up to one year from last visit, then month to month grace period for 3 mo, then further med refills will have to be denied)  

## 2020-12-04 NOTE — Telephone Encounter (Signed)
   Patient calling for status of Metformin and losartan refill Appointment scheduled for 12/17

## 2020-12-06 ENCOUNTER — Other Ambulatory Visit: Payer: Self-pay

## 2020-12-06 ENCOUNTER — Telehealth: Payer: Self-pay | Admitting: Internal Medicine

## 2020-12-06 ENCOUNTER — Other Ambulatory Visit (INDEPENDENT_AMBULATORY_CARE_PROVIDER_SITE_OTHER): Payer: Medicare Other

## 2020-12-06 DIAGNOSIS — N320 Bladder-neck obstruction: Secondary | ICD-10-CM

## 2020-12-06 DIAGNOSIS — E119 Type 2 diabetes mellitus without complications: Secondary | ICD-10-CM

## 2020-12-06 DIAGNOSIS — E538 Deficiency of other specified B group vitamins: Secondary | ICD-10-CM

## 2020-12-06 DIAGNOSIS — E559 Vitamin D deficiency, unspecified: Secondary | ICD-10-CM

## 2020-12-06 LAB — HEPATIC FUNCTION PANEL
ALT: 42 U/L (ref 0–53)
AST: 29 U/L (ref 0–37)
Albumin: 4.8 g/dL (ref 3.5–5.2)
Alkaline Phosphatase: 63 U/L (ref 39–117)
Bilirubin, Direct: 0.2 mg/dL (ref 0.0–0.3)
Total Bilirubin: 0.7 mg/dL (ref 0.2–1.2)
Total Protein: 7.3 g/dL (ref 6.0–8.3)

## 2020-12-06 LAB — CBC WITH DIFFERENTIAL/PLATELET
Basophils Absolute: 0 10*3/uL (ref 0.0–0.1)
Basophils Relative: 0.6 % (ref 0.0–3.0)
Eosinophils Absolute: 0.4 10*3/uL (ref 0.0–0.7)
Eosinophils Relative: 7.6 % — ABNORMAL HIGH (ref 0.0–5.0)
HCT: 44.9 % (ref 39.0–52.0)
Hemoglobin: 14.9 g/dL (ref 13.0–17.0)
Lymphocytes Relative: 25.9 % (ref 12.0–46.0)
Lymphs Abs: 1.4 10*3/uL (ref 0.7–4.0)
MCHC: 33.1 g/dL (ref 30.0–36.0)
MCV: 87 fl (ref 78.0–100.0)
Monocytes Absolute: 0.5 10*3/uL (ref 0.1–1.0)
Monocytes Relative: 8.5 % (ref 3.0–12.0)
Neutro Abs: 3.1 10*3/uL (ref 1.4–7.7)
Neutrophils Relative %: 57.4 % (ref 43.0–77.0)
Platelets: 161 10*3/uL (ref 150.0–400.0)
RBC: 5.17 Mil/uL (ref 4.22–5.81)
RDW: 13 % (ref 11.5–15.5)
WBC: 5.3 10*3/uL (ref 4.0–10.5)

## 2020-12-06 LAB — BASIC METABOLIC PANEL
BUN: 13 mg/dL (ref 6–23)
CO2: 30 mEq/L (ref 19–32)
Calcium: 9.4 mg/dL (ref 8.4–10.5)
Chloride: 102 mEq/L (ref 96–112)
Creatinine, Ser: 0.96 mg/dL (ref 0.40–1.50)
GFR: 81.88 mL/min (ref >60.00)
Glucose, Bld: 157 mg/dL — ABNORMAL HIGH (ref 70–99)
Potassium: 4.2 mEq/L (ref 3.5–5.1)
Sodium: 137 mEq/L (ref 135–145)

## 2020-12-06 LAB — VITAMIN D 25 HYDROXY (VIT D DEFICIENCY, FRACTURES): VITD: 34.3 ng/mL (ref 30.00–100.00)

## 2020-12-06 LAB — LIPID PANEL
Cholesterol: 122 mg/dL (ref 0–200)
HDL: 42.6 mg/dL (ref >39.00)
LDL Cholesterol: 62 mg/dL (ref 0–99)
NonHDL: 79.89
Total CHOL/HDL Ratio: 3
Triglycerides: 91 mg/dL (ref 0.0–149.0)
VLDL: 18.2 mg/dL (ref 0.0–40.0)

## 2020-12-06 LAB — URINALYSIS, ROUTINE W REFLEX MICROSCOPIC
Bilirubin Urine: NEGATIVE
Hgb urine dipstick: NEGATIVE
Ketones, ur: NEGATIVE
Leukocytes,Ua: NEGATIVE
Nitrite: NEGATIVE
RBC / HPF: NONE SEEN (ref 0–?)
Specific Gravity, Urine: 1.025 (ref 1.000–1.030)
Total Protein, Urine: NEGATIVE
Urine Glucose: NEGATIVE
Urobilinogen, UA: 0.2 (ref 0.0–1.0)
pH: 6 (ref 5.0–8.0)

## 2020-12-06 LAB — PSA: PSA: 1.71 ng/mL (ref 0.10–4.00)

## 2020-12-06 LAB — TSH: TSH: 2.07 u[IU]/mL (ref 0.35–4.50)

## 2020-12-06 LAB — HEMOGLOBIN A1C: Hgb A1c MFr Bld: 6.9 % — ABNORMAL HIGH (ref 4.6–6.5)

## 2020-12-06 LAB — VITAMIN B12: Vitamin B-12: 493 pg/mL (ref 211–911)

## 2020-12-06 NOTE — Telephone Encounter (Signed)
   Patient requesting order to get labs done today, prior to appointment  12/17  Please call patient

## 2020-12-06 NOTE — Telephone Encounter (Signed)
Orders done

## 2020-12-06 NOTE — Telephone Encounter (Signed)
Patient has been notified that lab orders are in.

## 2020-12-07 ENCOUNTER — Encounter: Payer: Self-pay | Admitting: Internal Medicine

## 2020-12-07 ENCOUNTER — Other Ambulatory Visit: Payer: Self-pay

## 2020-12-07 ENCOUNTER — Ambulatory Visit (INDEPENDENT_AMBULATORY_CARE_PROVIDER_SITE_OTHER): Payer: Medicare Other | Admitting: Internal Medicine

## 2020-12-07 VITALS — BP 140/70 | HR 82 | Temp 98.5°F | Ht 75.0 in | Wt 302.0 lb

## 2020-12-07 DIAGNOSIS — E7849 Other hyperlipidemia: Secondary | ICD-10-CM | POA: Diagnosis not present

## 2020-12-07 DIAGNOSIS — I119 Hypertensive heart disease without heart failure: Secondary | ICD-10-CM | POA: Diagnosis not present

## 2020-12-07 DIAGNOSIS — E1165 Type 2 diabetes mellitus with hyperglycemia: Secondary | ICD-10-CM

## 2020-12-07 DIAGNOSIS — I10 Essential (primary) hypertension: Secondary | ICD-10-CM

## 2020-12-07 DIAGNOSIS — Z0000 Encounter for general adult medical examination without abnormal findings: Secondary | ICD-10-CM | POA: Diagnosis not present

## 2020-12-07 LAB — MICROALBUMIN / CREATININE URINE RATIO
Creatinine,U: 225.8 mg/dL
Microalb Creat Ratio: 0.5 mg/g (ref 0.0–30.0)
Microalb, Ur: 1.2 mg/dL (ref 0.0–1.9)

## 2020-12-07 NOTE — Patient Instructions (Signed)
We have discussed the Cardiac CT Score test to measure the calcification level (if any) in your heart arteries.  This test has been ordered in our Computer System, so please call [facility] directly, as they prefer this, at [phone] to be scheduled.  Please continue all other medications as before, and refills have been done if requested.  Please have the pharmacy call with any other refills you may need.  Please continue your efforts at being more active, low cholesterol diet, and weight control.  You are otherwise up to date with prevention measures today.  Please keep your appointments with your specialists as you may have planned  You will be contacted by phone if any changes need to be made immediately based on your lab testing still pending.  Otherwise, you will receive a letter about your results with an explanation, but please check with MyChart first.  Please make an Appointment to return in 6 months, or sooner if needed, also with Lab Appointment for testing done 3-5 days before at the FIRST FLOOR Lab (so this is for TWO appointments - please see the scheduling desk as you leave)  Due to the ongoing Covid 19 pandemic, our lab now requires an appointment for any labs done at our office.  If you need labs done and do not have an appointment, please call our office ahead of time to schedule before presenting to the lab for your testing.

## 2020-12-07 NOTE — Progress Notes (Signed)
Subjective:    Patient ID: Danny [name], male    DOB: June 05, 1951, 69 y.o.   MRN: [id number]  HPI  Here to f/u; overall doing ok,  Pt denies chest pain, increasing sob or doe, wheezing, orthopnea, PND, increased LE swelling, palpitations, dizziness or syncope.  Pt denies new neurological symptoms such as new headache, or facial or extremity weakness or numbness.  Pt denies polydipsia, polyuria, or low sugar episode.  Pt states overall good compliance with meds, mostly trying to follow appropriate diet, with wt overall stable,  but little exercise however. BP Readings from Last 3 Encounters:  12/07/20 140/70  07/06/20 (!) 160/92  06/08/20 (!) 150/82   Past Medical History:  Diagnosis Date  . Allergic rhinitis, cause unspecified 03/12/2012  . Allergy    SEASONAL  . Anxiety   . Diabetes (HCC) 06/26/2008   Centricity Description: DM Qualifier: Diagnosis of  By: Jonny Ruiz MD, Len Blalock  Centricity Description: DIABETES MELLITUS, TYPE II Qualifier: Diagnosis of  By: Jonny Ruiz MD, Len Blalock   . DIVERTICULOSIS, COLON 07/05/2008  . DM w/o Complication Type II 06/26/2008  . HYPERLIPIDEMIA 06/27/2008  . HYPERTENSION 06/27/2008  . Kidney stones   . Mini stroke (HCC)   . OBSTRUCTIVE SLEEP APNEA 06/26/2008  . Sleep apnea    CPAP  . Stroke [facility])    MINI   Past Surgical History:  Procedure Laterality Date  . COLONOSCOPY    . COSMETIC SURGERY    . NOSE SURGERY  1970   cosmetic  . TONSILLECTOMY      reports that he has never smoked. He has never used smokeless tobacco. He reports current alcohol use. He reports that he does not use drugs. family history includes Cancer in his maternal grandfather and maternal grandmother; Emphysema in his paternal uncle; Heart disease in his father. Allergies  Allergen Reactions  . Penicillins    Current Outpatient Medications on File Prior to Visit  Medication Sig Dispense Refill  . amLODipine (NORVASC) 10 MG tablet TAKE 1 TABLET(10 MG) BY MOUTH DAILY 90 tablet 1  . aspirin  325 MG tablet Take 325 mg by mouth daily.    [name] atorvastatin (LIPITOR) 10 MG tablet TAKE 1 TABLET(10 MG) BY MOUTH DAILY 90 tablet 1  . citalopram (CELEXA) 20 MG tablet Take 1 tablet (20 mg total) by mouth daily. 90 tablet 3  . loratadine (CLARITIN) 10 MG tablet Take 1 tablet (10 mg total) by mouth as needed. 90 tablet 3  . losartan (COZAAR) 100 MG tablet TAKE 1 TABLET(100 MG) BY MOUTH DAILY 90 tablet 3  . metFORMIN (GLUCOPHAGE-XR) 500 MG 24 hr tablet TAKE 1 TABLET(500 MG) BY MOUTH DAILY WITH BREAKFAST 90 tablet 3  . Omega-3 Fatty Acids (FISH OIL) 500 MG CAPS Take by mouth 2 (two) times daily.    . pantoprazole (PROTONIX) 40 MG tablet Take 1 tablet (40 mg total) by mouth daily before breakfast. 90 tablet 3  . silver sulfADIAZINE (SILVADENE) 1 % cream Apply 1 application topically daily. 50 g 1   No current facility-administered medications on file prior to visit.   Review of Systems All otherwise neg per pt    Objective:   Physical Exam BP 140/70 (BP Location: Left Arm, Patient Position: Sitting, Cuff Size: Large)   Pulse 82   Temp 98.5 F (36.9 C) (Oral)   Ht 6\' 3"  (1.905 m)   Wt (!) 302 lb (137 kg)   SpO2 96%   BMI 37.75 kg/m  VS noted,  Constitutional: Pt appears in NAD HENT: Head: NCAT.  Right Ear: External ear normal.  Left Ear: External ear normal.  Eyes: . Pupils are equal, round, and reactive to light. Conjunctivae and EOM are normal Nose: without d/c or deformity Neck: Neck supple. Gross normal ROM Cardiovascular: Normal rate and regular rhythm.   Pulmonary/Chest: Effort normal and breath sounds without rales or wheezing.  Abd:  Soft, NT, ND, + BS, no organomegaly Neurological: Pt is alert. At baseline orientation, motor grossly intact Skin: Skin is warm. No rashes, other new lesions, no LE edema Psychiatric: Pt behavior is normal without agitation  All otherwise neg per pt Lab Results  Component Value Date   WBC 5.3 12/06/2020   HGB 14.9 12/06/2020   HCT 44.9  12/06/2020   PLT 161.0 12/06/2020   GLUCOSE 157 (H) 12/06/2020   CHOL 122 12/06/2020   TRIG 91.0 12/06/2020   HDL 42.60 12/06/2020   LDLCALC 62 12/06/2020   ALT 42 12/06/2020   AST 29 12/06/2020   NA 137 12/06/2020   K 4.2 12/06/2020   CL 102 12/06/2020   CREATININE 0.96 12/06/2020   BUN 13 12/06/2020   CO2 30 12/06/2020   TSH 2.07 12/06/2020   PSA 1.71 12/06/2020   HGBA1C 6.9 (H) 12/06/2020   MICROALBUR 1.2 12/06/2020      Assessment & Plan:

## 2020-12-09 ENCOUNTER — Encounter: Payer: Self-pay | Admitting: Internal Medicine

## 2020-12-09 NOTE — Assessment & Plan Note (Signed)
stable overall by history and exam, recent data reviewed with pt, and pt to continue medical treatment as before,  to f/u any worsening symptoms or concerns  

## 2020-12-09 NOTE — Assessment & Plan Note (Signed)
stable overall by history and exam, recent data reviewed with pt, and pt to continue medical treatment as before,  to f/u any worsening symptoms or concerns, also for cardiac ct score testing  I spent 31 minutes in preparing to see the patient by review of recent labs, imaging and procedures, obtaining and reviewing separately obtained history, communicating with the patient and family or caregiver, ordering medications, tests or procedures, and documenting clinical information in the EHR including the differential Dx, treatment, and any further evaluation and other management of dm, htn, hld

## 2020-12-09 NOTE — Addendum Note (Signed)
Addended by: Corwin Levins on: 12/09/2020 03:00 PM   Modules accepted: Orders

## 2020-12-31 ENCOUNTER — Encounter: Payer: Self-pay | Admitting: Internal Medicine

## 2021-03-13 ENCOUNTER — Other Ambulatory Visit: Payer: Self-pay

## 2021-03-13 ENCOUNTER — Encounter: Payer: Self-pay | Admitting: Internal Medicine

## 2021-03-13 ENCOUNTER — Ambulatory Visit (INDEPENDENT_AMBULATORY_CARE_PROVIDER_SITE_OTHER)
Admission: RE | Admit: 2021-03-13 | Discharge: 2021-03-13 | Disposition: A | Discharge status: Home / Self Care | Payer: Self-pay | Source: Ambulatory Visit | Attending: Internal Medicine | Admitting: Internal Medicine

## 2021-03-13 ENCOUNTER — Other Ambulatory Visit: Payer: Self-pay | Admitting: Internal Medicine

## 2021-03-13 DIAGNOSIS — I10 Essential (primary) hypertension: Secondary | ICD-10-CM

## 2021-03-13 DIAGNOSIS — E7849 Other hyperlipidemia: Secondary | ICD-10-CM

## 2021-03-13 DIAGNOSIS — E1165 Type 2 diabetes mellitus with hyperglycemia: Secondary | ICD-10-CM

## 2021-03-13 DIAGNOSIS — R931 Abnormal findings on diagnostic imaging of heart and coronary circulation: Secondary | ICD-10-CM | POA: Insufficient documentation

## 2021-03-13 DIAGNOSIS — I119 Hypertensive heart disease without heart failure: Secondary | ICD-10-CM

## 2021-05-23 ENCOUNTER — Other Ambulatory Visit: Payer: Self-pay | Admitting: Internal Medicine

## 2021-05-23 NOTE — Telephone Encounter (Signed)
Please refill as per office routine med refill policy (all routine meds refilled for 3 mo or monthly per pt preference up to one year from last visit, then month to month grace period for 3 mo, then further med refills will have to be denied)  

## 2021-05-24 ENCOUNTER — Other Ambulatory Visit: Payer: Self-pay | Admitting: Internal Medicine

## 2021-05-24 NOTE — Telephone Encounter (Signed)
Please refill as per office routine med refill policy (all routine meds refilled for 3 mo or monthly per pt preference up to one year from last visit, then month to month grace period for 3 mo, then further med refills will have to be denied)  

## 2021-06-06 ENCOUNTER — Other Ambulatory Visit: Payer: Self-pay

## 2021-06-06 ENCOUNTER — Other Ambulatory Visit (INDEPENDENT_AMBULATORY_CARE_PROVIDER_SITE_OTHER): Payer: Medicare Other

## 2021-06-06 DIAGNOSIS — E1165 Type 2 diabetes mellitus with hyperglycemia: Secondary | ICD-10-CM | POA: Diagnosis not present

## 2021-06-06 DIAGNOSIS — Z0000 Encounter for general adult medical examination without abnormal findings: Secondary | ICD-10-CM | POA: Diagnosis not present

## 2021-06-06 LAB — BASIC METABOLIC PANEL
BUN: 16 mg/dL (ref 6–23)
CO2: 24 mEq/L (ref 19–32)
Calcium: 9.2 mg/dL (ref 8.4–10.5)
Chloride: 106 mEq/L (ref 96–112)
Creatinine, Ser: 0.92 mg/dL (ref 0.40–1.50)
GFR: 85.87 mL/min (ref >60.00)
Glucose, Bld: 140 mg/dL — ABNORMAL HIGH (ref 70–99)
Potassium: 4.2 mEq/L (ref 3.5–5.1)
Sodium: 138 mEq/L (ref 135–145)

## 2021-06-06 LAB — TSH: TSH: 2.39 u[IU]/mL (ref 0.35–4.50)

## 2021-06-06 LAB — CBC WITH DIFFERENTIAL/PLATELET
Basophils Absolute: 0.1 10*3/uL (ref 0.0–0.1)
Basophils Relative: 0.9 % (ref 0.0–3.0)
Eosinophils Absolute: 0.3 10*3/uL (ref 0.0–0.7)
Eosinophils Relative: 5.3 % — ABNORMAL HIGH (ref 0.0–5.0)
HCT: 42.8 % (ref 39.0–52.0)
Hemoglobin: 14.4 g/dL (ref 13.0–17.0)
Lymphocytes Relative: 21.5 % (ref 12.0–46.0)
Lymphs Abs: 1.3 10*3/uL (ref 0.7–4.0)
MCHC: 33.7 g/dL (ref 30.0–36.0)
MCV: 86.2 fl (ref 78.0–100.0)
Monocytes Absolute: 0.5 10*3/uL (ref 0.1–1.0)
Monocytes Relative: 8.7 % (ref 3.0–12.0)
Neutro Abs: 3.8 10*3/uL (ref 1.4–7.7)
Neutrophils Relative %: 63.6 % (ref 43.0–77.0)
Platelets: 192 10*3/uL (ref 150.0–400.0)
RBC: 4.97 Mil/uL (ref 4.22–5.81)
RDW: 12.8 % (ref 11.5–15.5)
WBC: 6 10*3/uL (ref 4.0–10.5)

## 2021-06-06 LAB — URINALYSIS, ROUTINE W REFLEX MICROSCOPIC
Bilirubin Urine: NEGATIVE
Hgb urine dipstick: NEGATIVE
Nitrite: NEGATIVE
RBC / HPF: NONE SEEN (ref 0–?)
Specific Gravity, Urine: 1.02 (ref 1.000–1.030)
Total Protein, Urine: NEGATIVE
Urine Glucose: NEGATIVE
Urobilinogen, UA: 0.2 (ref 0.0–1.0)
pH: 6 (ref 5.0–8.0)

## 2021-06-06 LAB — LIPID PANEL
Cholesterol: 112 mg/dL (ref 0–200)
HDL: 38.9 mg/dL — ABNORMAL LOW (ref >39.00)
LDL Cholesterol: 49 mg/dL (ref 0–99)
NonHDL: 72.61
Total CHOL/HDL Ratio: 3
Triglycerides: 116 mg/dL (ref 0.0–149.0)
VLDL: 23.2 mg/dL (ref 0.0–40.0)

## 2021-06-06 LAB — HEPATIC FUNCTION PANEL
ALT: 37 U/L (ref 0–53)
AST: 28 U/L (ref 0–37)
Albumin: 4.7 g/dL (ref 3.5–5.2)
Alkaline Phosphatase: 64 U/L (ref 39–117)
Bilirubin, Direct: 0.2 mg/dL (ref 0.0–0.3)
Total Bilirubin: 0.9 mg/dL (ref 0.2–1.2)
Total Protein: 6.8 g/dL (ref 6.0–8.3)

## 2021-06-06 LAB — MICROALBUMIN / CREATININE URINE RATIO
Creatinine,U: 275.5 mg/dL
Microalb Creat Ratio: 0.8 mg/g (ref 0.0–30.0)
Microalb, Ur: 2.2 mg/dL — ABNORMAL HIGH (ref 0.0–1.9)

## 2021-06-06 LAB — PSA: PSA: 0.99 ng/mL (ref 0.10–4.00)

## 2021-06-06 LAB — HEMOGLOBIN A1C: Hgb A1c MFr Bld: 7.4 % — ABNORMAL HIGH (ref 4.6–6.5)

## 2021-06-07 ENCOUNTER — Encounter: Payer: Self-pay | Admitting: Internal Medicine

## 2021-06-07 ENCOUNTER — Ambulatory Visit (INDEPENDENT_AMBULATORY_CARE_PROVIDER_SITE_OTHER): Payer: Medicare Other | Admitting: Internal Medicine

## 2021-06-07 ENCOUNTER — Other Ambulatory Visit: Payer: Self-pay

## 2021-06-07 VITALS — BP 144/88 | HR 87 | Temp 98.6°F | Ht 75.0 in | Wt 298.8 lb

## 2021-06-07 DIAGNOSIS — I700 Atherosclerosis of aorta: Secondary | ICD-10-CM

## 2021-06-07 DIAGNOSIS — Z23 Encounter for immunization: Secondary | ICD-10-CM

## 2021-06-07 DIAGNOSIS — I10 Essential (primary) hypertension: Secondary | ICD-10-CM | POA: Diagnosis not present

## 2021-06-07 DIAGNOSIS — E1165 Type 2 diabetes mellitus with hyperglycemia: Secondary | ICD-10-CM | POA: Diagnosis not present

## 2021-06-07 DIAGNOSIS — R931 Abnormal findings on diagnostic imaging of heart and coronary circulation: Secondary | ICD-10-CM | POA: Diagnosis not present

## 2021-06-07 DIAGNOSIS — E7800 Pure hypercholesterolemia, unspecified: Secondary | ICD-10-CM

## 2021-06-07 MED ORDER — OZEMPIC (0.25 OR 0.5 MG/DOSE) 2 MG/1.5ML ~~LOC~~ SOPN: 0.5000 mg | PEN_INJECTOR | SUBCUTANEOUS | 3 refills | Status: DC

## 2021-06-07 NOTE — Patient Instructions (Addendum)
You had Shingrix shot #1 today  Please return in 2- 6 months for Shingrix #2  Please take all new medication as prescribed - the ozempic  Please continue all other medications as before, and refills have been done if requested.  Please have the pharmacy call with any other refills you may need.  Please continue your efforts at being more active, low cholesterol diet, and weight control.  Please keep your appointments with your specialists as you may have planned  Please make an Appointment to return in 6 months, or sooner if needed, also with Lab Appointment for testing done 3-5 days before at the FIRST FLOOR Lab (so this is for TWO appointments - please see the scheduling desk as you leave)  Due to the ongoing Covid 19 pandemic, our lab now requires an appointment for any labs done at our office.  If you need labs done and do not have an appointment, please call our office ahead of time to schedule before presenting to the lab for your testing.

## 2021-06-07 NOTE — Progress Notes (Signed)
Patient ID: Danny [name], male   DOB: [birth year], 70 y.o.   MRN: [id number]         Chief Complaint:: yearly exam, hld, dm, obesity, coronary and aortic atherosclerosis       HPI:  Danny [name] is a 70 y.o. male here overal doing well.  Pt denies chest pain, increased sob or doe, wheezing, orthopnea, PND, increased LE swelling, palpitations, dizziness or syncope.   Pt denies polydipsia, polyuria, or new focal neuro s/s.  Cant seem to lose wt despite decreased calories and trying to be more active.  Pt plans to call cardiology for CV f/u.  Has seen optho just after last visit.  Due for shingrix   Wt Readings from Last 3 Encounters:  06/07/21 298 lb 12.8 oz (135.5 kg)  12/07/20 (!) 302 lb (137 kg)  07/06/20 294 lb (133.4 kg)   BP Readings from Last 3 Encounters:  06/07/21 (!) 144/88  12/07/20 140/70  07/06/20 (!) 160/92   Immunization History  Administered Date(s) Administered   Influenza Whole 11/15/2010   Influenza, High Dose Seasonal PF 10/07/2018, 10/05/2019   Influenza,inj,Quad PF,6+ Mos 09/30/2017   Influenza-Unspecified 09/22/2015, 10/22/2020   PFIZER(Purple Top)SARS-COV-2 Vaccination 01/27/2020, 02/17/2020, 09/18/2020, 04/01/2021   Pneumococcal Conjugate-13 02/25/2016, 03/31/2017   Pneumococcal Polysaccharide-23 12/30/2018   Td 06/27/1998, 07/26/2010   Tdap 12/04/2016   Zoster Recombinat (Shingrix) 06/07/2021   Health Maintenance Due  Topic Date Due   FOOT EXAM  06/08/2021      Past Medical History:  Diagnosis Date   Allergic rhinitis, cause unspecified 03/12/2012   Allergy    SEASONAL   Anxiety    Diabetes (HCC) 06/26/2008   Centricity Description: DM Qualifier: Diagnosis of  By: Jonny Ruiz MD, Len Blalock  Centricity Description: DIABETES MELLITUS, TYPE II Qualifier: Diagnosis of  By: Jonny Ruiz MD, Len Blalock    DIVERTICULOSIS, COLON 07/05/2008   DM w/o Complication Type II 06/26/2008   HYPERLIPIDEMIA 06/27/2008   HYPERTENSION 06/27/2008   Kidney stones    Mini stroke (HCC)     OBSTRUCTIVE SLEEP APNEA 06/26/2008   Sleep apnea    CPAP   Stroke [facility])    MINI   Past Surgical History:  Procedure Laterality Date   COLONOSCOPY     COSMETIC SURGERY     NOSE SURGERY  1970   cosmetic   TONSILLECTOMY      reports that he has never smoked. He has never used smokeless tobacco. He reports current alcohol use. He reports that he does not use drugs. family history includes Cancer in his maternal grandfather and maternal grandmother; Emphysema in his paternal uncle; Heart disease in his father. Allergies  Allergen Reactions   Penicillins    Current Outpatient Medications on File Prior to Visit  Medication Sig Dispense Refill   amLODipine (NORVASC) 10 MG tablet TAKE 1 TABLET(10 MG) BY MOUTH DAILY 30 tablet 0   aspirin 325 MG tablet Take 325 mg by mouth daily.     atorvastatin (LIPITOR) 10 MG tablet TAKE 1 TABLET(10 MG) BY MOUTH DAILY 30 tablet 0   citalopram (CELEXA) 20 MG tablet Take 1 tablet (20 mg total) by mouth daily. 90 tablet 3   loratadine (CLARITIN) 10 MG tablet Take 1 tablet (10 mg total) by mouth as needed. 90 tablet 3   losartan (COZAAR) 100 MG tablet TAKE 1 TABLET(100 MG) BY MOUTH DAILY 90 tablet 3   metFORMIN (GLUCOPHAGE-XR) 500 MG 24 hr tablet TAKE 1 TABLET(500 MG) BY MOUTH DAILY WITH BREAKFAST  90 tablet 3   Omega-3 Fatty Acids (FISH OIL) 500 MG CAPS Take by mouth 2 (two) times daily.     pantoprazole (PROTONIX) 40 MG tablet Take 1 tablet (40 mg total) by mouth daily before breakfast. 90 tablet 3   silver sulfADIAZINE (SILVADENE) 1 % cream Apply 1 application topically daily. 50 g 1   No current facility-administered medications on file prior to visit.        ROS:  All others reviewed and negative.  Objective        PE:  BP (!) 144/88 (BP Location: Left Arm, Patient Position: Sitting, Cuff Size: Large)   Pulse 87   Temp 98.6 F (37 C) (Oral)   Ht 6\' 3"  (1.905 m)   Wt 298 lb 12.8 oz (135.5 kg)   SpO2 96%   BMI 37.35 kg/m                  Constitutional: Pt appears in NAD               HENT: Head: NCAT.                Right Ear: External ear normal.                 Left Ear: External ear normal.                Eyes: . Pupils are equal, round, and reactive to light. Conjunctivae and EOM are normal               Nose: without d/c or deformity               Neck: Neck supple. Gross normal ROM               Cardiovascular: Normal rate and regular rhythm.                 Pulmonary/Chest: Effort normal and breath sounds without rales or wheezing.                Abd:  Soft, NT, ND, + BS, no organomegaly               Neurological: Pt is alert. At baseline orientation, motor grossly intact               Skin: Skin is warm. No rashes, no other new lesions, LE edema - none               Psychiatric: Pt behavior is normal without agitation   Micro: none  Cardiac tracings I have personally interpreted today:  none  Pertinent Radiological findings (summarize): none   Lab Results  Component Value Date   WBC 6.0 06/06/2021   HGB 14.4 06/06/2021   HCT 42.8 06/06/2021   PLT 192.0 06/06/2021   GLUCOSE 140 (H) 06/06/2021   CHOL 112 06/06/2021   TRIG 116.0 06/06/2021   HDL 38.90 (L) 06/06/2021   LDLCALC 49 06/06/2021   ALT 37 06/06/2021   AST 28 06/06/2021   NA 138 06/06/2021   K 4.2 06/06/2021   CL 106 06/06/2021   CREATININE 0.92 06/06/2021   BUN 16 06/06/2021   CO2 24 06/06/2021   TSH 2.39 06/06/2021   PSA 0.99 06/06/2021   HGBA1C 7.4 (H) 06/06/2021   MICROALBUR 2.2 (H) 06/06/2021   Assessment/Plan:  Danny [name] is a 70 y.o. White or Caucasian [1] male with  has a past medical history of Allergic rhinitis,  cause unspecified (03/12/2012), Allergy, Anxiety, Diabetes (HCC) (06/26/2008), DIVERTICULOSIS, COLON (07/05/2008), DM w/o Complication Type II (06/26/2008), HYPERLIPIDEMIA (06/27/2008), HYPERTENSION (06/27/2008), Kidney stones, Mini stroke (HCC), OBSTRUCTIVE SLEEP APNEA (06/26/2008), Sleep apnea, and Stroke (HCC).  High  coronary artery calcium score D/w pt, to continue asa 325, lipitor 10, low chol diet and pt states will call cardiology for f/u  Aortic atherosclerosis (HCC) D/w pt, to continue asa 325, lipitor 10, low chol diet   Diabetes (HCC) Lab Results  Component Value Date   HGBA1C 7.4 (H) 06/06/2021   Stable, pt to continue current medical treatment metformin   Hyperlipidemia Lab Results  Component Value Date   LDLCALC 49 06/06/2021   Stable, pt to continue current statin lipitor 10   Hypertension, uncontrolled BP Readings from Last 3 Encounters:  06/07/21 (!) 144/88  12/07/20 140/70  07/06/20 (!) 160/92   Stable, pt to continue medical treatment norvasc, losartan - declines further med change for now, plans to work on wt loss   Morbid obesity (HCC) dw pt, unable to lose wt, for ozempic asd,  to f/u any worsening symptoms or concerns  Followup: Return in about 6 months (around 12/07/2021).  [name], MD 06/10/2021 9:05 PM [facility]

## 2021-06-10 ENCOUNTER — Encounter: Payer: Self-pay | Admitting: Internal Medicine

## 2021-06-10 NOTE — Assessment & Plan Note (Signed)
D/w pt, to continue asa 325, lipitor 10, low chol diet

## 2021-06-10 NOTE — Assessment & Plan Note (Signed)
Lab Results  Component Value Date   LDLCALC 49 06/06/2021   Stable, pt to continue current statin lipitor 10

## 2021-06-10 NOTE — Assessment & Plan Note (Signed)
dw pt, unable to lose wt, for ozempic asd,  to f/u any worsening symptoms or concerns

## 2021-06-10 NOTE — Assessment & Plan Note (Signed)
BP Readings from Last 3 Encounters:  06/07/21 (!) 144/88  12/07/20 140/70  07/06/20 (!) 160/92   Stable, pt to continue medical treatment norvasc, losartan - declines further med change for now, plans to work on wt loss

## 2021-06-10 NOTE — Assessment & Plan Note (Signed)
Lab Results  Component Value Date   HGBA1C 7.4 (H) 06/06/2021   Stable, pt to continue current medical treatment metformin

## 2021-06-10 NOTE — Assessment & Plan Note (Signed)
D/w pt, to continue asa 325, lipitor 10, low chol diet and pt states will call cardiology for f/u

## 2021-06-22 ENCOUNTER — Other Ambulatory Visit: Payer: Self-pay | Admitting: Internal Medicine

## 2021-06-22 NOTE — Telephone Encounter (Signed)
Please refill as per office routine med refill policy (all routine meds refilled for 3 mo or monthly per pt preference up to one year from last visit, then month to month grace period for 3 mo, then further med refills will have to be denied)  

## 2021-06-26 NOTE — Telephone Encounter (Signed)
  Patient called and is requesting a 90 day prescription. He also said he was told by his pharmacy that the refill request was denied. Please advise

## 2021-08-02 ENCOUNTER — Ambulatory Visit: Payer: Medicare Other

## 2021-08-23 ENCOUNTER — Telehealth: Payer: Self-pay | Admitting: Internal Medicine

## 2021-08-23 ENCOUNTER — Telehealth: Payer: Self-pay

## 2021-08-23 MED ORDER — OZEMPIC (0.25 OR 0.5 MG/DOSE) 2 MG/1.5ML ~~LOC~~ SOPN: 0.5000 mg | PEN_INJECTOR | SUBCUTANEOUS | 0 refills | Status: DC

## 2021-08-23 NOTE — Telephone Encounter (Signed)
Ok done

## 2021-08-23 NOTE — Telephone Encounter (Signed)
Left message for patient to call me back at [phone] to schedule Medicare Annual Wellness Visit   No hx of AWV eligible as of 10/23/19  Please schedule at anytime with [facility] Advisor if patient calls the office back.    40 Minutes appointment   Any questions, please call me at [phone]

## 2021-08-23 NOTE — Telephone Encounter (Signed)
Patient notified

## 2021-09-10 ENCOUNTER — Telehealth: Payer: Self-pay

## 2021-09-10 NOTE — Telephone Encounter (Signed)
Please advise as the pt has stated he was rx'd Semaglutide,0.25 or 0.5MG /DOS, (OZEMPIC, 0.25 OR 0.5 MG/DOSE,) 2 MG/1.5ML SOPN and has been taking it for 2 1/2 months. However, pt states he is not tolerating it to well with vomiting and diarrhea after taking shot.  **Pt is asking can he please be rx an alternative.

## 2021-09-11 MED ORDER — TRULICITY 0.75 MG/0.5ML ~~LOC~~ SOAJ: 0.7500 mg | SUBCUTANEOUS | 3 refills | Status: DC

## 2021-09-11 NOTE — Telephone Encounter (Signed)
Ok to try change to trulicity - done erx

## 2021-09-11 NOTE — Addendum Note (Signed)
Addended by: Corwin Levins on: 09/11/2021 06:42 PM   Modules accepted: Orders

## 2021-11-19 ENCOUNTER — Other Ambulatory Visit: Payer: Self-pay | Admitting: Internal Medicine

## 2021-11-19 DIAGNOSIS — H524 Presbyopia: Secondary | ICD-10-CM | POA: Diagnosis not present

## 2021-11-19 DIAGNOSIS — E119 Type 2 diabetes mellitus without complications: Secondary | ICD-10-CM | POA: Diagnosis not present

## 2021-11-19 DIAGNOSIS — H2513 Age-related nuclear cataract, bilateral: Secondary | ICD-10-CM | POA: Diagnosis not present

## 2021-11-19 DIAGNOSIS — H5213 Myopia, bilateral: Secondary | ICD-10-CM | POA: Diagnosis not present

## 2021-11-19 DIAGNOSIS — H35033 Hypertensive retinopathy, bilateral: Secondary | ICD-10-CM | POA: Diagnosis not present

## 2021-11-19 DIAGNOSIS — H52223 Regular astigmatism, bilateral: Secondary | ICD-10-CM | POA: Diagnosis not present

## 2021-11-19 LAB — HM DIABETES EYE EXAM

## 2021-11-19 NOTE — Telephone Encounter (Signed)
Please refill as per office routine med refill policy (all routine meds to be refilled for 3 mo or monthly (per pt preference) up to one year from last visit, then month to month grace period for 3 mo, then further med refills will have to be denied) ? ?

## 2021-12-05 ENCOUNTER — Other Ambulatory Visit (INDEPENDENT_AMBULATORY_CARE_PROVIDER_SITE_OTHER): Payer: Medicare Other

## 2021-12-05 DIAGNOSIS — E1165 Type 2 diabetes mellitus with hyperglycemia: Secondary | ICD-10-CM | POA: Diagnosis not present

## 2021-12-05 LAB — HEPATIC FUNCTION PANEL
ALT: 29 U/L (ref 0–53)
AST: 23 U/L (ref 0–37)
Albumin: 4.5 g/dL (ref 3.5–5.2)
Alkaline Phosphatase: 60 U/L (ref 39–117)
Bilirubin, Direct: 0.1 mg/dL (ref 0.0–0.3)
Total Bilirubin: 0.6 mg/dL (ref 0.2–1.2)
Total Protein: 7.2 g/dL (ref 6.0–8.3)

## 2021-12-05 LAB — BASIC METABOLIC PANEL
BUN: 18 mg/dL (ref 6–23)
CO2: 31 mEq/L (ref 19–32)
Calcium: 9.3 mg/dL (ref 8.4–10.5)
Chloride: 102 mEq/L (ref 96–112)
Creatinine, Ser: 1.18 mg/dL (ref 0.40–1.50)
GFR: 63.48 mL/min (ref >60.00)
Glucose, Bld: 131 mg/dL — ABNORMAL HIGH (ref 70–99)
Potassium: 4.1 mEq/L (ref 3.5–5.1)
Sodium: 140 mEq/L (ref 135–145)

## 2021-12-05 LAB — LIPID PANEL
Cholesterol: 114 mg/dL (ref 0–200)
HDL: 42.1 mg/dL (ref >39.00)
LDL Cholesterol: 38 mg/dL (ref 0–99)
NonHDL: 72.38
Total CHOL/HDL Ratio: 3
Triglycerides: 170 mg/dL — ABNORMAL HIGH (ref 0.0–149.0)
VLDL: 34 mg/dL (ref 0.0–40.0)

## 2021-12-05 LAB — HEMOGLOBIN A1C: Hgb A1c MFr Bld: 6.9 % — ABNORMAL HIGH (ref 4.6–6.5)

## 2021-12-09 ENCOUNTER — Other Ambulatory Visit: Payer: Self-pay

## 2021-12-09 ENCOUNTER — Ambulatory Visit (INDEPENDENT_AMBULATORY_CARE_PROVIDER_SITE_OTHER): Payer: Medicare Other | Admitting: Internal Medicine

## 2021-12-09 ENCOUNTER — Encounter: Payer: Self-pay | Admitting: Internal Medicine

## 2021-12-09 ENCOUNTER — Ambulatory Visit (INDEPENDENT_AMBULATORY_CARE_PROVIDER_SITE_OTHER): Payer: Medicare Other

## 2021-12-09 ENCOUNTER — Telehealth: Payer: Self-pay | Admitting: Internal Medicine

## 2021-12-09 VITALS — BP 138/78 | HR 72 | Temp 98.4°F | Ht 75.0 in | Wt 289.0 lb

## 2021-12-09 DIAGNOSIS — E1165 Type 2 diabetes mellitus with hyperglycemia: Secondary | ICD-10-CM | POA: Diagnosis not present

## 2021-12-09 DIAGNOSIS — Z0000 Encounter for general adult medical examination without abnormal findings: Secondary | ICD-10-CM

## 2021-12-09 DIAGNOSIS — I10 Essential (primary) hypertension: Secondary | ICD-10-CM | POA: Diagnosis not present

## 2021-12-09 DIAGNOSIS — R931 Abnormal findings on diagnostic imaging of heart and coronary circulation: Secondary | ICD-10-CM | POA: Diagnosis not present

## 2021-12-09 DIAGNOSIS — E559 Vitamin D deficiency, unspecified: Secondary | ICD-10-CM

## 2021-12-09 DIAGNOSIS — E7800 Pure hypercholesterolemia, unspecified: Secondary | ICD-10-CM | POA: Diagnosis not present

## 2021-12-09 DIAGNOSIS — N320 Bladder-neck obstruction: Secondary | ICD-10-CM | POA: Diagnosis not present

## 2021-12-09 DIAGNOSIS — E538 Deficiency of other specified B group vitamins: Secondary | ICD-10-CM

## 2021-12-09 MED ORDER — RYBELSUS 3 MG PO TABS: 3.0000 mg | ORAL_TABLET | Freq: Every day | ORAL | 0 refills | Status: DC

## 2021-12-09 MED ORDER — OZEMPIC (0.25 OR 0.5 MG/DOSE) 2 MG/1.5ML ~~LOC~~ SOPN: 0.5000 mg | PEN_INJECTOR | SUBCUTANEOUS | 3 refills | Status: DC

## 2021-12-09 MED ORDER — RYBELSUS 7 MG PO TABS: 7.0000 mg | ORAL_TABLET | Freq: Every day | ORAL | 3 refills | Status: DC

## 2021-12-09 NOTE — Telephone Encounter (Signed)
Patient states Semaglutide (RYBELSUS) 3 MG TABS is too expensive  Patient requesting a new rx for [facility]  Pharmacy [id number] - [place], [place] - [address]

## 2021-12-09 NOTE — Telephone Encounter (Signed)
Ok done

## 2021-12-09 NOTE — Progress Notes (Signed)
Subjective:   Danny [name] is a 70 y.o. male who presents for an Initial Medicare Annual Wellness Visit.  Review of Systems     Cardiac Risk Factors include: family history of premature cardiovascular disease;advanced age (>58men, >57 women);diabetes mellitus;dyslipidemia;hypertension;male gender;obesity (BMI >30kg/m2)     Objective:    Today's Vitals   12/09/21 1115  BP: 138/78  Pulse: 72  Temp: 98.4 F (36.9 C)  Weight: 289 lb (131.1 kg)  Height:  (1.905 m)  PainSc: 0-No pain   Body mass index is 36.12 kg/m.  Advanced Directives 12/09/2021  Does Patient Have a Medical Advance Directive? Yes  Type of Advance Directive Living will;Healthcare Power of Attorney  Does patient want to make changes to medical advance directive? No - Patient declined  Copy of Healthcare Power of Attorney in Chart? No - copy requested    Current Medications (verified) Outpatient Encounter Medications as of 12/09/2021  Medication Sig   amLODipine (NORVASC) 10 MG tablet TAKE 1 TABLET(10 MG) BY MOUTH DAILY   aspirin 325 MG tablet Take 325 mg by mouth daily.   atorvastatin (LIPITOR) 10 MG tablet Take 1 tablet (10 mg total) by mouth daily.   citalopram (CELEXA) 20 MG tablet Take 1 tablet (20 mg total) by mouth daily.   loratadine (CLARITIN) 10 MG tablet Take 1 tablet (10 mg total) by mouth as needed.   losartan (COZAAR) 100 MG tablet TAKE 1 TABLET(100 MG) BY MOUTH DAILY   metFORMIN (GLUCOPHAGE-XR) 500 MG 24 hr tablet TAKE 1 TABLET(500 MG) BY MOUTH DAILY WITH BREAKFAST   Omega-3 Fatty Acids (FISH OIL) 500 MG CAPS Take by mouth 2 (two) times daily.   pantoprazole (PROTONIX) 40 MG tablet Take 1 tablet (40 mg total) by mouth daily before breakfast. (Patient not taking: Reported on 12/09/2021)   silver sulfADIAZINE (SILVADENE) 1 % cream Apply 1 application topically daily.   [DISCONTINUED] Dulaglutide (TRULICITY) 0.75 MG/0.5ML SOPN Inject 0.75 mg into the skin once a week. (Patient not taking:  Reported on 12/09/2021)   No facility-administered encounter medications on file as of 12/09/2021.    Allergies (verified) Penicillins   History: Past Medical History:  Diagnosis Date   Allergic rhinitis, cause unspecified 03/12/2012   Allergy    SEASONAL   Anxiety    Diabetes (HCC) 06/26/2008   Centricity Description: DM Qualifier: Diagnosis of  By: Jonny Ruiz MD, Len Blalock  Centricity Description: DIABETES MELLITUS, TYPE II Qualifier: Diagnosis of  By: Jonny Ruiz MD, Len Blalock    DIVERTICULOSIS, COLON 07/05/2008   DM w/o Complication Type II 06/26/2008   HYPERLIPIDEMIA 06/27/2008   HYPERTENSION 06/27/2008   Kidney stones    Mini stroke    OBSTRUCTIVE SLEEP APNEA 06/26/2008   Sleep apnea    CPAP   Stroke [facility])    MINI   Past Surgical History:  Procedure Laterality Date   COLONOSCOPY     COSMETIC SURGERY     NOSE SURGERY  1970   cosmetic   TONSILLECTOMY     Family History  Problem Relation Age of Onset   Heart disease Father        MI-smoker   Cancer Maternal Grandfather        type unknown   Cancer Maternal Grandmother        type unknown   Emphysema Paternal Uncle    Social History   Socioeconomic History   Marital status: Married    Spouse name: Not on file   Number of children: 2  Years of education: Not on file   Highest education level: Not on file  Occupational History   Occupation: Conservation officer, nature: [employer]  Tobacco Use   Smoking status: Never   Smokeless tobacco: Never  Substance and Sexual Activity   Alcohol use: Yes    Alcohol/week: 0.0 standard drinks    Comment: 0-2 per day   Drug use: No   Sexual activity: Not on file  Other Topics Concern   Not on file  Social History Narrative   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 He's married 2 daughters. 0-2 alcoholic drinks a day 0-2 caffeinated beverages a day.04/18/2016   Social Determinants of Health   Financial Resource Strain: Low  Risk    Difficulty of Paying Living Expenses: Not hard at all  Food Insecurity: No Food Insecurity   Worried About Programme researcher, broadcasting/film/video in the Last Year: Never true   Ran Out of Food in the Last Year: Never true  Transportation Needs: No Transportation Needs   Lack of Transportation (Medical): No   Lack of Transportation (Non-Medical): No  Physical Activity: Not on file  Stress: No Stress Concern Present   Feeling of Stress : Not at all  Social Connections: Unknown   Frequency of Communication with Friends and Family: More than three times a week   Frequency of Social Gatherings with Friends and Family: More than three times a week   Attends Religious Services: Patient refused   Database administrator or Organizations: Patient refused   Attends Engineer, structural: Patient refused   Marital Status: Married    Tobacco Counseling Counseling given: Not Answered   Clinical Intake:  Pre-visit preparation completed: Yes  Pain : No/denies pain Pain Score: 0-No pain     BMI - recorded: 36.12 Nutritional Status: BMI > 30  Obese Nutritional Risks: None Diabetes: Yes CBG done?: No Did pt. bring in CBG monitor from home?: No  How often do you need to have someone help you when you read instructions, pamphlets, or other written materials from your doctor or pharmacy?: 1 - Never What is the last grade level you completed in school?: Retired Medical laboratory scientific officer  Diabetic? yes  Interpreter Needed?: No  Information entered by :: Susie Cassette, LPN   Activities of Daily Living In your present state of health, do you have any difficulty performing the following activities: 12/09/2021 06/07/2021  Hearing? N N  Vision? N N  Difficulty concentrating or making decisions? N N  Walking or climbing stairs? N N  Dressing or bathing? N N  Doing errands, shopping? N N  Preparing Food and eating ? N -  Using the Toilet? N -  In the past six months, have you accidently leaked  urine? N -  Do you have problems with loss of bowel control? N -  Managing your Medications? N -  Managing your Finances? N -  Housekeeping or managing your Housekeeping? N -  Some recent data might be hidden    Patient Care Team: Corwin Levins, MD as PCP - General Blima Ledger, OD (Optometry)  Indicate any recent Medical Services you may have received from other than [facility] providers in the past year (date may be approximate).     Assessment:   This is a routine wellness examination for Danny [name].  Hearing/Vision screen Hearing Screening - Comments:: Patient denied any hearing difficulty.   No hearing  aids.  Vision Screening - Comments:: Patient wears corrective glasses/contacts.  Eye exam done annually by:   Dietary issues and exercise activities discussed: Current Exercise Habits: Home exercise routine, Type of exercise: walking, Time (Minutes): 30, Frequency (Times/Week): 5, Weekly Exercise (Minutes/Week): 150, Intensity: Moderate, Exercise limited by: None identified   Goals Addressed               This Visit's Progress     Patient Stated (pt-stated)        My goal is to lose some weight and increase my physical activity.      Depression Screen PHQ 2/9 Scores 12/09/2021 06/07/2021 06/08/2020 06/15/2019 04/06/2018 03/31/2017 02/25/2016  PHQ - 2 Score 0 0 0 0 0 0 0  PHQ- 9 Score - - - - 0 - -    Fall Risk Fall Risk  12/09/2021 06/07/2021 06/08/2020 07/29/2019 06/15/2019  Falls in the past year? 0 0 0 0 0  Number falls in past yr: 0 0 - 0 -  Injury with Fall? 0 0 - 0 -  Risk for fall due to : No Fall Risks - - - -  Follow up Falls prevention discussed - - Falls evaluation completed -    FALL RISK PREVENTION PERTAINING TO THE HOME:  Any stairs in or around the home? Yes  If so, are there any without handrails? No  Home free of loose throw rugs in walkways, pet beds, electrical cords, etc? Yes  Adequate lighting in your home to reduce risk of falls? Yes   ASSISTIVE  DEVICES UTILIZED TO PREVENT FALLS:  Life alert? No  Use of a cane, walker or w/c? No  Grab bars in the bathroom? No  Shower chair or bench in shower? No  Elevated toilet seat or a handicapped toilet? No   TIMED UP AND GO:  Was the test performed? Yes .  Length of time to ambulate 10 feet: 5 sec.   Gait steady and fast without use of assistive device  Cognitive Function: Normal cognitive status assessed by direct observation by this Nurse Health Advisor. No abnormalities found.          Immunizations Immunization History  Administered Date(s) Administered   Influenza Whole 11/15/2010   Influenza, High Dose Seasonal PF 10/07/2018, 10/05/2019   Influenza,inj,Quad PF,6+ Mos 09/30/2017   Influenza-Unspecified 09/22/2015, 10/22/2020, 09/09/2021   PFIZER(Purple Top)SARS-COV-2 Vaccination 01/27/2020, 02/17/2020, 09/18/2020, 04/01/2021, 10/17/2021   Pneumococcal Conjugate-13 02/25/2016, 03/31/2017   Pneumococcal Polysaccharide-23 12/30/2018   Td 06/27/1998, 07/26/2010   Tdap 12/04/2016   Zoster Recombinat (Shingrix) 06/07/2021    TDAP status: Up to date  Flu Vaccine status: Up to date  Pneumococcal vaccine status: Up to date  Covid-19 vaccine status: Completed vaccines  Qualifies for Shingles Vaccine? Yes   Zostavax completed Yes   Shingrix Completed?: Yes  Screening Tests Health Maintenance  Topic Date Due   Zoster Vaccines- Shingrix (2 of 2) 03/09/2022 (Originally 08/02/2021)   HEMOGLOBIN A1C  06/05/2022   OPHTHALMOLOGY EXAM  11/19/2022   FOOT EXAM  12/09/2022   COLONOSCOPY (Pts 45-21yrs Insurance coverage will need to be confirmed)  05/16/2026   TETANUS/TDAP  12/04/2026   Pneumonia Vaccine 59+ Years old  Completed   INFLUENZA VACCINE  Completed   COVID-19 Vaccine  Completed   Hepatitis C Screening  Completed   HPV VACCINES  Aged Out    Health Maintenance  There are no preventive care reminders to display for this patient.  Colorectal cancer screening: Type  of screening:  Colonoscopy. Completed 05/16/2016. Repeat every 10 years  Lung Cancer Screening: (Low Dose CT Chest recommended if Age 75-80 years, 30 pack-year currently smoking OR have quit w/in 15years.) does not qualify.   Lung Cancer Screening Referral: no  Additional Screening:  Hepatitis C Screening: does qualify; Completed yes  Vision Screening: Recommended annual ophthalmology exams for early detection of glaucoma and other disorders of the eye. Is the patient up to date with their annual eye exam?  Yes  Who is the provider or what is the name of the office in which the patient attends annual eye exams? Blima Ledger, OD. If pt is not established with a provider, would they like to be referred to a provider to establish care? No .   Dental Screening: Recommended annual dental exams for proper oral hygiene  Community Resource Referral / Chronic Care Management: CRR required this visit?  No   CCM required this visit?  No      Plan:     I have personally reviewed and noted the following in the patients chart:   Medical and social history Use of alcohol, tobacco or illicit drugs  Current medications and supplements including opioid prescriptions. Patient is not currently taking opioid prescriptions. Functional ability and status Nutritional status Physical activity Advanced directives List of other physicians Hospitalizations, surgeries, and ER visits in previous 12 months Vitals Screenings to include cognitive, depression, and falls Referrals and appointments  In addition, I have reviewed and discussed with patient certain preventive protocols, quality metrics, and best practice recommendations. A written personalized care plan for preventive services as well as general preventive health recommendations were provided to patient.     Mickeal Needy, LPN   37/09/[id number]   Nurse Notes:  Hearing Screening - Comments:: Patient denied any hearing difficulty.   No  hearing aids.  Vision Screening - Comments:: Patient wears corrective glasses/contacts.  Eye exam done annually by:

## 2021-12-09 NOTE — Progress Notes (Signed)
Patient ID: Danny [name], male   DOB: [birth year], 70 y.o.   MRN: [id number]        Chief Complaint: follow up HTN, HLD and hyperglycemia        HPI:  Danny [name] is a 70 y.o. male here overall doing well; Pt denies chest pain, increased sob or doe, wheezing, orthopnea, PND, increased LE swelling, palpitations, dizziness or syncope.   Pt denies polydipsia, polyuria, or new focal neuro s/s.   Pt denies fever, night sweats, loss of appetite, or other constitutional symptoms  Wt down amost 15 lbs from 1 yr ago but cant seem to lose further with diet and excercise Wt Readings from Last 3 Encounters:  12/09/21 289 lb (131.1 kg)  12/09/21 289 lb (131.1 kg)  06/07/21 298 lb 12.8 oz (135.5 kg)   BP Readings from Last 3 Encounters:  12/09/21 138/78  12/09/21 138/78  06/07/21 (!) 144/88         Past Medical History:  Diagnosis Date   Allergic rhinitis, cause unspecified 03/12/2012   Allergy    SEASONAL   Anxiety    Diabetes (HCC) 06/26/2008   Centricity Description: DM Qualifier: Diagnosis of  By: Jonny Ruiz MD, Len Blalock  Centricity Description: DIABETES MELLITUS, TYPE II Qualifier: Diagnosis of  By: Jonny Ruiz MD, Len Blalock    DIVERTICULOSIS, COLON 07/05/2008   DM w/o Complication Type II 06/26/2008   HYPERLIPIDEMIA 06/27/2008   HYPERTENSION 06/27/2008   Kidney stones    Mini stroke    OBSTRUCTIVE SLEEP APNEA 06/26/2008   Sleep apnea    CPAP   Stroke [facility])    MINI   Past Surgical History:  Procedure Laterality Date   COLONOSCOPY     COSMETIC SURGERY     NOSE SURGERY  1970   cosmetic   TONSILLECTOMY      reports that he has never smoked. He has never used smokeless tobacco. He reports current alcohol use. He reports that he does not use drugs. family history includes Cancer in his maternal grandfather and maternal grandmother; Emphysema in his paternal uncle; Heart disease in his father. Allergies  Allergen Reactions   Penicillins    Current Outpatient Medications on File Prior to Visit   Medication Sig Dispense Refill   amLODipine (NORVASC) 10 MG tablet TAKE 1 TABLET(10 MG) BY MOUTH DAILY 90 tablet 1   aspirin 325 MG tablet Take 325 mg by mouth daily.     atorvastatin (LIPITOR) 10 MG tablet Take 1 tablet (10 mg total) by mouth daily. 90 tablet 1   citalopram (CELEXA) 20 MG tablet Take 1 tablet (20 mg total) by mouth daily. 90 tablet 3   loratadine (CLARITIN) 10 MG tablet Take 1 tablet (10 mg total) by mouth as needed. 90 tablet 3   losartan (COZAAR) 100 MG tablet TAKE 1 TABLET(100 MG) BY MOUTH DAILY 90 tablet 2   metFORMIN (GLUCOPHAGE-XR) 500 MG 24 hr tablet TAKE 1 TABLET(500 MG) BY MOUTH DAILY WITH BREAKFAST 90 tablet 2   Omega-3 Fatty Acids (FISH OIL) 500 MG CAPS Take by mouth 2 (two) times daily.     silver sulfADIAZINE (SILVADENE) 1 % cream Apply 1 application topically daily. 50 g 1   pantoprazole (PROTONIX) 40 MG tablet Take 1 tablet (40 mg total) by mouth daily before breakfast. (Patient not taking: Reported on 12/09/2021) 90 tablet 3   No current facility-administered medications on file prior to visit.        ROS:  All others reviewed and  negative.  Objective        PE:  BP 138/78 (BP Location: Left Arm, Patient Position: Sitting, Cuff Size: Large)    Pulse 72    Temp 98.4 F (36.9 C) (Oral)    Ht 6\' 3"  (1.905 m)    Wt 289 lb (131.1 kg)    SpO2 97%    BMI 36.12 kg/m                 Constitutional: Pt appears in NAD               HENT: Head: NCAT.                Right Ear: External ear normal.                 Left Ear: External ear normal.                Eyes: . Pupils are equal, round, and reactive to light. Conjunctivae and EOM are normal               Nose: without d/c or deformity               Neck: Neck supple. Gross normal ROM               Cardiovascular: Normal rate and regular rhythm.                 Pulmonary/Chest: Effort normal and breath sounds without rales or wheezing.                Abd:  Soft, NT, ND, + BS, no organomegaly                Neurological: Pt is alert. At baseline orientation, motor grossly intact               Skin: Skin is warm. No rashes, no other new lesions, LE edema - none               Psychiatric: Pt behavior is normal without agitation   Micro: none  Cardiac tracings I have personally interpreted today:  none  Pertinent Radiological findings (summarize): none   Lab Results  Component Value Date   WBC 6.0 06/06/2021   HGB 14.4 06/06/2021   HCT 42.8 06/06/2021   PLT 192.0 06/06/2021   GLUCOSE 131 (H) 12/05/2021   CHOL 114 12/05/2021   TRIG 170.0 (H) 12/05/2021   HDL 42.10 12/05/2021   LDLCALC 38 12/05/2021   ALT 29 12/05/2021   AST 23 12/05/2021   NA 140 12/05/2021   K 4.1 12/05/2021   CL 102 12/05/2021   CREATININE 1.18 12/05/2021   BUN 18 12/05/2021   CO2 31 12/05/2021   TSH 2.39 06/06/2021   PSA 0.99 06/06/2021   HGBA1C 6.9 (H) 12/05/2021   MICROALBUR 2.2 (H) 06/06/2021   Assessment/Plan:  Danny [name] is a 70 y.o. White or Caucasian [1] male with  has a past medical history of Allergic rhinitis, cause unspecified (03/12/2012), Allergy, Anxiety, Diabetes (HCC) (06/26/2008), DIVERTICULOSIS, COLON (07/05/2008), DM w/o Complication Type II (06/26/2008), HYPERLIPIDEMIA (06/27/2008), HYPERTENSION (06/27/2008), Kidney stones, Mini stroke, OBSTRUCTIVE SLEEP APNEA (06/26/2008), Sleep apnea, and Stroke (HCC).  Diabetes (HCC) Lab Results  Component Value Date   HGBA1C 6.9 (H) 12/05/2021   Pt has Mild elevated a1c and desires further wt loss, pt to continue current medical treatment metformin and add rybelsus 3 mg, cont DM diet and increased activity  Hyperlipidemia Lab Results  Component Value Date   LDLCALC 38 12/05/2021   Stable, pt to continue current statin lipitor 10   Hypertension, uncontrolled BP Readings from Last 3 Encounters:  12/09/21 138/78  12/09/21 138/78  06/07/21 (!) 144/88   Stable, pt to continue medical treatment norvasc, losartan  Followup: Return in about 6 months  (around 06/09/2022).  [name], MD 12/10/2021 4:43 AM [facility]

## 2021-12-09 NOTE — Patient Instructions (Signed)
Please take all new medication as prescribed - the rybelsus at 3 mg for the first month, then 7 mg after  Please continue all other medications as before, and refills have been done if requested.  Please have the pharmacy call with any other refills you may need.  Please continue your efforts at being more active, low cholesterol diet, and weight control.  Please keep your appointments with your specialists as you may have planned  Please make an Appointment to return in 6 months, or sooner if needed, also with Lab Appointment for testing done 3-5 days before at the FIRST FLOOR Lab (so this is for TWO appointments - please see the scheduling desk as you leave)  Due to the ongoing Covid 19 pandemic, our lab now requires an appointment for any labs done at our office.  If you need labs done and do not have an appointment, please call our office ahead of time to schedule before presenting to the lab for your testing.

## 2021-12-10 ENCOUNTER — Encounter: Payer: Self-pay | Admitting: Internal Medicine

## 2021-12-10 NOTE — Assessment & Plan Note (Signed)
BP Readings from Last 3 Encounters:  12/09/21 138/78  12/09/21 138/78  06/07/21 (!) 144/88   Stable, pt to continue medical treatment norvasc, losartan

## 2021-12-10 NOTE — Assessment & Plan Note (Signed)
Lab Results  Component Value Date   LDLCALC 38 12/05/2021   Stable, pt to continue current statin lipitor 10

## 2021-12-10 NOTE — Assessment & Plan Note (Signed)
Lab Results  Component Value Date   HGBA1C 6.9 (H) 12/05/2021   Pt has Mild elevated a1c and desires further wt loss, pt to continue current medical treatment metformin and add rybelsus 3 mg, cont DM diet and increased activity

## 2021-12-11 NOTE — Telephone Encounter (Signed)
Patient notified and states that the pharmacy is working [employer] out with insurance

## 2021-12-19 ENCOUNTER — Other Ambulatory Visit: Payer: Self-pay | Admitting: Internal Medicine

## 2021-12-19 NOTE — Telephone Encounter (Signed)
Please refill as per office routine med refill policy (all routine meds to be refilled for 3 mo or monthly (per pt preference) up to one year from last visit, then month to month grace period for 3 mo, then further med refills will have to be denied) ? ?

## 2022-01-20 ENCOUNTER — Telehealth: Payer: Self-pay | Admitting: Internal Medicine

## 2022-01-20 MED ORDER — TRULICITY 0.75 MG/0.5ML ~~LOC~~ SOAJ: 0.7500 mg | SUBCUTANEOUS | 3 refills | Status: DC

## 2022-01-20 NOTE — Telephone Encounter (Signed)
Ok to try change to trulicity which does well with sugar,but just not as good as ozempic with wt loss  We can try change back to ozempic when back in stock

## 2022-01-20 NOTE — Telephone Encounter (Signed)
Patient states Semaglutide,0.25 or 0.5MG /DOS, (OZEMPIC, 0.25 OR 0.5 MG/DOSE,) 2 MG/1.5ML SOPN is on back order  Pt requesting to pick up hard copy of rx  Pt requesting a c/b to discuss alternative medications

## 2022-01-21 NOTE — Telephone Encounter (Signed)
Patient notified of the temporary switch to Trulicity  but declines due to having a week supply left of ozempic,

## 2022-03-24 ENCOUNTER — Telehealth: Payer: Self-pay | Admitting: Internal Medicine

## 2022-03-24 MED ORDER — SEMAGLUTIDE (1 MG/DOSE) 4 MG/3ML ~~LOC~~ SOPN: 1.0000 mg | PEN_INJECTOR | SUBCUTANEOUS | 3 refills | Status: DC

## 2022-03-24 NOTE — Telephone Encounter (Signed)
Pt states pharmacy only has the 1mg  of ozempic in stock ? ?Pt inquiring if provider can send in a new rx for the 1mg  ?

## 2022-05-02 ENCOUNTER — Telehealth: Payer: Self-pay

## 2022-05-02 DIAGNOSIS — G4733 Obstructive sleep apnea (adult) (pediatric): Secondary | ICD-10-CM

## 2022-05-02 NOTE — Telephone Encounter (Signed)
So do I need to do the referral?  thanks ?

## 2022-05-02 NOTE — Telephone Encounter (Signed)
Very sorry, I dont treat sleep apnea, but I can refer to pulmonary for this,  thanks ?

## 2022-05-02 NOTE — Telephone Encounter (Signed)
Advised pt that Dr. Jenny Reichmann doesn't treat Sleep apnea and that a referral for PUL is needed to manage his Sleep Apnea and CPAP. ? ?Please understood and agreed to follow through with PUL as recommended. ? ?FYI ?

## 2022-05-02 NOTE — Telephone Encounter (Signed)
Pt was advised by [facility] that he will need a new Rx for a CPAP machine as the current one is out of warranty and bearings are going bad. ? ?[facility] provided pt with the name of the CPAP to order. ? ?Resmed-Airsense 11 ? ?Please advise ?

## 2022-05-02 NOTE — Telephone Encounter (Signed)
Yes, TY.

## 2022-05-02 NOTE — Addendum Note (Signed)
Addended by: Corwin Levins on: 05/02/2022 12:41 PM ? ? Modules accepted: Orders ? ?

## 2022-05-02 NOTE — Telephone Encounter (Signed)
Noted../lmb 

## 2022-06-03 ENCOUNTER — Ambulatory Visit (INDEPENDENT_AMBULATORY_CARE_PROVIDER_SITE_OTHER): Payer: Medicare Other | Admitting: Pulmonary Disease

## 2022-06-03 ENCOUNTER — Encounter: Payer: Self-pay | Admitting: Pulmonary Disease

## 2022-06-03 VITALS — BP 120/78 | HR 92 | Temp 97.9°F | Ht 75.0 in | Wt 287.8 lb

## 2022-06-03 DIAGNOSIS — G4733 Obstructive sleep apnea (adult) (pediatric): Secondary | ICD-10-CM

## 2022-06-03 NOTE — Progress Notes (Signed)
Danny SidleRobert M [name]    [id number]    1951/01/05  Primary Care Physician:Danny, [name], [name]  Referring Physician: Corwin [name], Danny [name] [address] [place] [id number]  Chief complaint:   Patient with a history of obstructive sleep apnea In for follow-up today  HPI:  Patient with moderate obstructive sleep apnea Very compliant with CPAP use As noted that the machine started making noise T[facility] the machine [facility] He was encouraged to start the process for obtaining a new machine  Usually goes to bed about 11 30-12 midnight Sleeps about 8 AM Sleep is restorative  Uses machine nightly Feels continues to benefit from CPAP use  No significant changes to his health since last visit here he has type 2 diabetes, well controlled  activity level is maintained  Denies a chronic headaches No dryness of his mouth in the mornings  His last sleep study was at least over 10 years ago  Outpatient Encounter Medications as of 06/03/2022  Medication Sig   amLODipine (NORVASC) 10 MG tablet TAKE 1 TABLET(10 MG) BY MOUTH DAILY   aspirin 325 MG tablet Take 325 mg by mouth daily.   atorvastatin (LIPITOR) 10 MG tablet TAKE 1 TABLET(10 MG) BY MOUTH DAILY   atorvastatin (LIPITOR) 10 MG tablet TAKE 1 TABLET(10 MG) BY MOUTH DAILY   citalopram (CELEXA) 20 MG tablet Take 1 tablet (20 mg total) by mouth daily.   Dulaglutide (TRULICITY) 0.75 MG/0.5ML SOPN Inject 0.75 mg into the skin once a week.   loratadine (CLARITIN) 10 MG tablet Take 1 tablet (10 mg total) by mouth as needed.   losartan (COZAAR) 100 MG tablet TAKE 1 TABLET(100 MG) BY MOUTH DAILY   metFORMIN (GLUCOPHAGE-XR) 500 MG 24 hr tablet TAKE 1 TABLET(500 MG) BY MOUTH DAILY WITH BREAKFAST   Omega-3 Fatty Acids (FISH OIL) 500 MG CAPS Take by mouth 2 (two) times daily.   pantoprazole (PROTONIX) 40 MG tablet Take 1 tablet (40 mg total) by mouth daily before breakfast.   Semaglutide, 1 MG/DOSE, 4 MG/3ML SOPN Inject 1  mg as directed once a week.   silver sulfADIAZINE (SILVADENE) 1 % cream Apply 1 application topically daily.   No facility-administered encounter medications on file as of 06/03/2022.    Allergies as of 06/03/2022 - Review Complete 06/03/2022  Allergen Reaction Noted   Penicillins Other (See Comments) 07/30/2009    Past Medical History:  Diagnosis Date   Allergic rhinitis, cause unspecified 03/12/2012   Allergy    SEASONAL   Anxiety    Diabetes (HCC) 06/26/2008   Centricity Description: DM Qualifier: Diagnosis of  By: [name] [name], [name]  Centricity Description: DIABETES MELLITUS, TYPE II Qualifier: Diagnosis of  By: [name] [name], [name]    DIVERTICULOSIS, COLON 07/05/2008   DM w/o Complication Type II 06/26/2008   HYPERLIPIDEMIA 06/27/2008   HYPERTENSION 06/27/2008   Kidney stones    Mini stroke    OBSTRUCTIVE SLEEP APNEA 06/26/2008   Sleep apnea    CPAP   Stroke [facility](HCC)    MINI    Past Surgical History:  Procedure Laterality Date   COLONOSCOPY     COSMETIC SURGERY     NOSE SURGERY  1970   cosmetic   TONSILLECTOMY      Family History  Problem Relation Age of Onset   Heart disease Father        MI-smoker   Cancer Maternal Grandfather  type unknown   Cancer Maternal Grandmother        type unknown   Emphysema Paternal Uncle     Social History   Socioeconomic History   Marital status: Married    Spouse name: Not on file   Number of children: 2   Years of education: Not on file   Highest education level: Not on file  Occupational History   Occupation: Conservation officer, nature: [employer]  Tobacco Use   Smoking status: Never   Smokeless tobacco: Never  Substance and Sexual Activity   Alcohol use: Yes    Alcohol/week: 0.0 standard drinks of alcohol    Comment: 0-2 per day   Drug use: No   Sexual activity: Not on file  Other Topics Concern   Not on file  Social History Narrative   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 He's married 2 daughters. 0-2 alcoholic drinks a day 0-2 caffeinated beverages a day.04/18/2016   Social Determinants of Health   Financial Resource Strain: Low Risk  (12/09/2021)   Overall Financial Resource Strain (CARDIA)    Difficulty of Paying Living Expenses: Not hard at all  Food Insecurity: No Food Insecurity (12/09/2021)   Hunger Vital Sign    Worried About Running Out of Food in the Last Year: Never true    Ran Out of Food in the Last Year: Never true  Transportation Needs: No Transportation Needs (12/09/2021)   PRAPARE - Administrator, Civil Service (Medical): No    Lack of Transportation (Non-Medical): No  Physical Activity: Not on file  Stress: No Stress Concern Present (12/09/2021)   [employer] of Occupational Health - Occupational Stress Questionnaire    Feeling of Stress : Not at all  Social Connections: Unknown (12/09/2021)   Social Connection and Isolation Panel [NHANES]    Frequency of Communication with Friends and Family: More than three times a week    Frequency of Social Gatherings with Friends and Family: More than three times a week    Attends Religious Services: Patient refused    Active Member of Clubs or Organizations: Patient refused    Attends Banker Meetings: Patient refused    Marital Status: Married  Catering manager Violence: Not At Risk (12/09/2021)   Humiliation, Afraid, Rape, and Kick questionnaire    Fear of Current or Ex-Partner: No    Emotionally Abused: No    Physically Abused: No    Sexually Abused: No    Review of Systems  Constitutional:  Positive for unexpected weight change.  Respiratory:  Positive for apnea.   Psychiatric/Behavioral:  Positive for sleep disturbance.   All other systems reviewed and are negative.   Vitals:   06/03/22 1143  BP: 120/78  Pulse: 92  Temp: 97.9 F (36.6 C)  SpO2: 97%     Physical Exam HENT:     Head: Normocephalic and atraumatic.   Eyes:     General:        Right eye: No discharge.        Left eye: No discharge.     Pupils: Pupils are equal, round, and reactive to light.  Neck:     Thyroid: No thyromegaly.     Trachea: No tracheal deviation.  Cardiovascular:     Rate and Rhythm: Normal rate and regular rhythm.  Pulmonary:     Effort: Pulmonary effort is normal. No respiratory distress.  Breath sounds: Normal breath sounds. No stridor. No wheezing or rhonchi.  Musculoskeletal:        General: No deformity. Normal range of motion.     Cervical back: Normal range of motion and neck supple.  Skin:    General: Skin is dry.  Neurological:     Mental Status: He is alert.     Cranial Nerves: No cranial nerve deficit.    Data Reviewed:  Compliance data reviewed showing excellent compliance with CPAP 100% use Machine set between 5 and 15 Residual AHI of 3.2  Assessment:  Moderate obstructive sleep apnea -Adequately treated with CPAP  Hypertension-controlled  Diabetes-working on weight loss and regular exercises-on metformin  Machine is becoming dysfunctional  Plan/Recommendations:  Prescription for auto CPAP 5-15 with heated humidification to go to [facility]  Follow-up a year from now  Virl Diamond [name] [facility] 06/03/2022, 11:53 AM  CC: Danny Levins, [name]

## 2022-06-03 NOTE — Patient Instructions (Signed)
  CPAP prescription to [facility]  Auto CPAP 5-15 with heated humidification  I will see you a year from here

## 2022-06-09 ENCOUNTER — Other Ambulatory Visit (INDEPENDENT_AMBULATORY_CARE_PROVIDER_SITE_OTHER): Payer: Medicare Other

## 2022-06-09 DIAGNOSIS — E538 Deficiency of other specified B group vitamins: Secondary | ICD-10-CM | POA: Diagnosis not present

## 2022-06-09 DIAGNOSIS — E559 Vitamin D deficiency, unspecified: Secondary | ICD-10-CM

## 2022-06-09 DIAGNOSIS — E1165 Type 2 diabetes mellitus with hyperglycemia: Secondary | ICD-10-CM

## 2022-06-09 DIAGNOSIS — N320 Bladder-neck obstruction: Secondary | ICD-10-CM

## 2022-06-09 LAB — HEMOGLOBIN A1C: Hgb A1c MFr Bld: 6.4 % (ref 4.6–6.5)

## 2022-06-09 LAB — BASIC METABOLIC PANEL
BUN: 13 mg/dL (ref 6–23)
CO2: 29 mEq/L (ref 19–32)
Calcium: 9.1 mg/dL (ref 8.4–10.5)
Chloride: 103 mEq/L (ref 96–112)
Creatinine, Ser: 1 mg/dL (ref 0.40–1.50)
GFR: 77.15 mL/min (ref >60.00)
Glucose, Bld: 153 mg/dL — ABNORMAL HIGH (ref 70–99)
Potassium: 4.2 mEq/L (ref 3.5–5.1)
Sodium: 139 mEq/L (ref 135–145)

## 2022-06-09 LAB — CBC WITH DIFFERENTIAL/PLATELET
Basophils Absolute: 0 10*3/uL (ref 0.0–0.1)
Basophils Relative: 0.7 % (ref 0.0–3.0)
Eosinophils Absolute: 0.4 10*3/uL (ref 0.0–0.7)
Eosinophils Relative: 7 % — ABNORMAL HIGH (ref 0.0–5.0)
HCT: 43.9 % (ref 39.0–52.0)
Hemoglobin: 14.4 g/dL (ref 13.0–17.0)
Lymphocytes Relative: 27 % (ref 12.0–46.0)
Lymphs Abs: 1.5 10*3/uL (ref 0.7–4.0)
MCHC: 32.8 g/dL (ref 30.0–36.0)
MCV: 87.8 fl (ref 78.0–100.0)
Monocytes Absolute: 0.5 10*3/uL (ref 0.1–1.0)
Monocytes Relative: 8.7 % (ref 3.0–12.0)
Neutro Abs: 3.3 10*3/uL (ref 1.4–7.7)
Neutrophils Relative %: 56.6 % (ref 43.0–77.0)
Platelets: 187 10*3/uL (ref 150.0–400.0)
RBC: 5 Mil/uL (ref 4.22–5.81)
RDW: 12.9 % (ref 11.5–15.5)
WBC: 5.7 10*3/uL (ref 4.0–10.5)

## 2022-06-09 LAB — URINALYSIS, ROUTINE W REFLEX MICROSCOPIC
Bilirubin Urine: NEGATIVE
Hgb urine dipstick: NEGATIVE
Ketones, ur: NEGATIVE
Nitrite: NEGATIVE
Specific Gravity, Urine: 1.01 (ref 1.000–1.030)
Total Protein, Urine: NEGATIVE
Urine Glucose: NEGATIVE
Urobilinogen, UA: 1 (ref 0.0–1.0)
pH: 6.5 (ref 5.0–8.0)

## 2022-06-09 LAB — HEPATIC FUNCTION PANEL
ALT: 28 U/L (ref 0–53)
AST: 27 U/L (ref 0–37)
Albumin: 4.5 g/dL (ref 3.5–5.2)
Alkaline Phosphatase: 61 U/L (ref 39–117)
Bilirubin, Direct: 0.2 mg/dL (ref 0.0–0.3)
Total Bilirubin: 0.9 mg/dL (ref 0.2–1.2)
Total Protein: 6.5 g/dL (ref 6.0–8.3)

## 2022-06-09 LAB — LIPID PANEL
Cholesterol: 99 mg/dL (ref 0–200)
HDL: 37.1 mg/dL — ABNORMAL LOW (ref >39.00)
LDL Cholesterol: 35 mg/dL (ref 0–99)
NonHDL: 61.76
Total CHOL/HDL Ratio: 3
Triglycerides: 132 mg/dL (ref 0.0–149.0)
VLDL: 26.4 mg/dL (ref 0.0–40.0)

## 2022-06-09 LAB — VITAMIN D 25 HYDROXY (VIT D DEFICIENCY, FRACTURES): VITD: 40.1 ng/mL (ref 30.00–100.00)

## 2022-06-09 LAB — VITAMIN B12: Vitamin B-12: 393 pg/mL (ref 211–911)

## 2022-06-09 LAB — TSH: TSH: 1.66 u[IU]/mL (ref 0.35–5.50)

## 2022-06-09 LAB — MICROALBUMIN / CREATININE URINE RATIO
Creatinine,U: 155.1 mg/dL
Microalb Creat Ratio: 0.6 mg/g (ref 0.0–30.0)
Microalb, Ur: 0.9 mg/dL (ref 0.0–1.9)

## 2022-06-09 LAB — PSA: PSA: 1.31 ng/mL (ref 0.10–4.00)

## 2022-06-10 ENCOUNTER — Ambulatory Visit (INDEPENDENT_AMBULATORY_CARE_PROVIDER_SITE_OTHER): Payer: Medicare Other | Admitting: Internal Medicine

## 2022-06-10 ENCOUNTER — Encounter: Payer: Self-pay | Admitting: Internal Medicine

## 2022-06-10 DIAGNOSIS — E538 Deficiency of other specified B group vitamins: Secondary | ICD-10-CM | POA: Diagnosis not present

## 2022-06-10 DIAGNOSIS — Z125 Encounter for screening for malignant neoplasm of prostate: Secondary | ICD-10-CM

## 2022-06-10 DIAGNOSIS — I10 Essential (primary) hypertension: Secondary | ICD-10-CM

## 2022-06-10 DIAGNOSIS — N320 Bladder-neck obstruction: Secondary | ICD-10-CM

## 2022-06-10 DIAGNOSIS — E7800 Pure hypercholesterolemia, unspecified: Secondary | ICD-10-CM | POA: Diagnosis not present

## 2022-06-10 DIAGNOSIS — E559 Vitamin D deficiency, unspecified: Secondary | ICD-10-CM

## 2022-06-10 DIAGNOSIS — E1165 Type 2 diabetes mellitus with hyperglycemia: Secondary | ICD-10-CM | POA: Diagnosis not present

## 2022-06-10 NOTE — Progress Notes (Unsigned)
Patient ID: Danny [name], male   DOB: [birth year], 71 y.o.   MRN: [id number]        Chief Complaint: follow up HTN, HLD and hyperglycemia ***       HPI:  Danny [name] is a 71 y.o. male here with c/o        Wt Readings from Last 3 Encounters:  06/10/22 289 lb 12.8 oz (131.5 kg)  06/03/22 287 lb 12.8 oz (130.5 kg)  12/09/21 289 lb (131.1 kg)   BP Readings from Last 3 Encounters:  06/10/22 138/70  06/03/22 120/78  12/09/21 138/78         Past Medical History:  Diagnosis Date   Allergic rhinitis, cause unspecified 03/12/2012   Allergy    SEASONAL   Anxiety    Diabetes (HCC) 06/26/2008   Centricity Description: DM Qualifier: Diagnosis of  By: Jonny Ruiz MD, Len Blalock  Centricity Description: DIABETES MELLITUS, TYPE II Qualifier: Diagnosis of  By: Jonny Ruiz MD, Len Blalock    DIVERTICULOSIS, COLON 07/05/2008   DM w/o Complication Type II 06/26/2008   HYPERLIPIDEMIA 06/27/2008   HYPERTENSION 06/27/2008   Kidney stones    Mini stroke    OBSTRUCTIVE SLEEP APNEA 06/26/2008   Sleep apnea    CPAP   Stroke [facility])    MINI   Past Surgical History:  Procedure Laterality Date   COLONOSCOPY     COSMETIC SURGERY     NOSE SURGERY  1970   cosmetic   TONSILLECTOMY      reports that he has never smoked. He has never used smokeless tobacco. He reports current alcohol use. He reports that he does not use drugs. family history includes Cancer in his maternal grandfather and maternal grandmother; Emphysema in his paternal uncle; Heart disease in his father. Allergies  Allergen Reactions   Penicillins Other (See Comments)    He is not sure what the reaction   Current Outpatient Medications on File Prior to Visit  Medication Sig Dispense Refill   amLODipine (NORVASC) 10 MG tablet TAKE 1 TABLET(10 MG) BY MOUTH DAILY 90 tablet 3   aspirin 325 MG tablet Take 325 mg by mouth daily.     atorvastatin (LIPITOR) 10 MG tablet TAKE 1 TABLET(10 MG) BY MOUTH DAILY 90 tablet 3   atorvastatin (LIPITOR) 10 MG tablet TAKE 1  TABLET(10 MG) BY MOUTH DAILY 90 tablet 3   Coenzyme Q10 (CO Q 10 PO) Take by mouth.     loratadine (CLARITIN) 10 MG tablet Take 1 tablet (10 mg total) by mouth as needed. 90 tablet 3   losartan (COZAAR) 100 MG tablet TAKE 1 TABLET(100 MG) BY MOUTH DAILY 90 tablet 2   metFORMIN (GLUCOPHAGE-XR) 500 MG 24 hr tablet TAKE 1 TABLET(500 MG) BY MOUTH DAILY WITH BREAKFAST 90 tablet 2   Omega-3 Fatty Acids (FISH OIL) 500 MG CAPS Take by mouth 2 (two) times daily.     Semaglutide, 1 MG/DOSE, 4 MG/3ML SOPN Inject 1 mg as directed once a week. 9 mL 3   silver sulfADIAZINE (SILVADENE) 1 % cream Apply 1 application topically daily. 50 g 1   citalopram (CELEXA) 20 MG tablet Take 1 tablet (20 mg total) by mouth daily. (Patient not taking: Reported on 06/10/2022) 90 tablet 3   Dulaglutide (TRULICITY) 0.75 MG/0.5ML SOPN Inject 0.75 mg into the skin once a week. (Patient not taking: Reported on 06/10/2022) 6 mL 3   pantoprazole (PROTONIX) 40 MG tablet Take 1 tablet (40 mg total) by mouth daily before  breakfast. (Patient not taking: Reported on 06/10/2022) 90 tablet 3   No current facility-administered medications on file prior to visit.        ROS:  All others reviewed and negative.  Objective        PE:  BP 138/70 (BP Location: Left Arm, Patient Position: Sitting, Cuff Size: Large)   Pulse 84   Temp 97.9 F (36.6 C) (Oral)   Resp 16   Ht 6\' 3"  (1.905 m)   Wt 289 lb 12.8 oz (131.5 kg)   BMI 36.22 kg/m                 Constitutional: Pt appears in NAD               HENT: Head: NCAT.                Right Ear: External ear normal.                 Left Ear: External ear normal.                Eyes: . Pupils are equal, round, and reactive to light. Conjunctivae and EOM are normal               Nose: without d/c or deformity               Neck: Neck supple. Gross normal ROM               Cardiovascular: Normal rate and regular rhythm.                 Pulmonary/Chest: Effort normal and breath sounds without  rales or wheezing.                Abd:  Soft, NT, ND, + BS, no organomegaly               Neurological: Pt is alert. At baseline orientation, motor grossly intact               Skin: Skin is warm. No rashes, no other new lesions, LE edema - ***               Psychiatric: Pt behavior is normal without agitation   Micro: none  Cardiac tracings I have personally interpreted today:  none  Pertinent Radiological findings (summarize): none   Lab Results  Component Value Date   WBC 5.7 06/09/2022   HGB 14.4 06/09/2022   HCT 43.9 06/09/2022   PLT 187.0 06/09/2022   GLUCOSE 153 (H) 06/09/2022   CHOL 99 06/09/2022   TRIG 132.0 06/09/2022   HDL 37.10 (L) 06/09/2022   LDLCALC 35 06/09/2022   ALT 28 06/09/2022   AST 27 06/09/2022   NA 139 06/09/2022   K 4.2 06/09/2022   CL 103 06/09/2022   CREATININE 1.00 06/09/2022   BUN 13 06/09/2022   CO2 29 06/09/2022   TSH 1.66 06/09/2022   PSA 1.31 06/09/2022   HGBA1C 6.4 06/09/2022   MICROALBUR 0.9 06/09/2022   Assessment/Plan:  Danny [name] is a 71 y.o. White or Caucasian [1] male with  has a past medical history of Allergic rhinitis, cause unspecified (03/12/2012), Allergy, Anxiety, Diabetes (HCC) (06/26/2008), DIVERTICULOSIS, COLON (07/05/2008), DM w/o Complication Type II (06/26/2008), HYPERLIPIDEMIA (06/27/2008), HYPERTENSION (06/27/2008), Kidney stones, Mini stroke, OBSTRUCTIVE SLEEP APNEA (06/26/2008), Sleep apnea, and Stroke (HCC).  No problem-specific Assessment & Plan notes found for this encounter.  Followup: No follow-ups on file.  [name], MD 06/10/2022 11:31 AM [facility]

## 2022-06-14 ENCOUNTER — Encounter: Payer: Self-pay | Admitting: Internal Medicine

## 2022-06-14 NOTE — Assessment & Plan Note (Signed)
Lab Results  Component Value Date   HGBA1C 6.4 06/09/2022   Stable, pt to continue current medical treatment metformin ER 500 qd, and semiglutide 1 mg weekly, declines increased ozempic and reduced metformin for now, but may be willing next vist in order to effect more wt loss

## 2022-07-01 ENCOUNTER — Telehealth: Payer: Self-pay | Admitting: Pulmonary Disease

## 2022-07-01 NOTE — Telephone Encounter (Signed)
Called [facility] and spoke with Judeth Cornfield and she states that she is going to have her mangers look into this because he got a machine back in 2021 and is not eligible for a new one till 2025. Patient states he got approved from insurance for new machine. Order that was placed last month was canceled due to eligibility. Judeth Cornfield states that after she speaks with higher ups that she will let us know if patient needs new order for cpap. [facility] is on phone with patient now on this matter. Nothing further needed

## 2022-07-11 ENCOUNTER — Telehealth: Payer: Self-pay | Admitting: Internal Medicine

## 2022-07-11 DIAGNOSIS — K2210 Ulcer of esophagus without bleeding: Secondary | ICD-10-CM

## 2022-07-11 DIAGNOSIS — K222 Esophageal obstruction: Secondary | ICD-10-CM

## 2022-07-11 MED ORDER — LOSARTAN POTASSIUM 100 MG PO TABS: ORAL_TABLET | ORAL | 1 refills | Status: DC

## 2022-07-11 MED ORDER — CITALOPRAM HYDROBROMIDE 20 MG PO TABS: 20.0000 mg | ORAL_TABLET | Freq: Every day | ORAL | 1 refills | Status: DC

## 2022-07-11 MED ORDER — SEMAGLUTIDE (1 MG/DOSE) 4 MG/3ML ~~LOC~~ SOPN: 1.0000 mg | PEN_INJECTOR | SUBCUTANEOUS | 1 refills | Status: DC

## 2022-07-11 MED ORDER — AMLODIPINE BESYLATE 10 MG PO TABS: ORAL_TABLET | ORAL | 1 refills | Status: DC

## 2022-07-11 MED ORDER — PANTOPRAZOLE SODIUM 40 MG PO TBEC: 40.0000 mg | DELAYED_RELEASE_TABLET | Freq: Every day | ORAL | 1 refills | Status: DC

## 2022-07-11 MED ORDER — ATORVASTATIN CALCIUM 10 MG PO TABS: ORAL_TABLET | ORAL | 1 refills | Status: DC

## 2022-07-11 MED ORDER — LORATADINE 10 MG PO TABS: 10.0000 mg | ORAL_TABLET | ORAL | 1 refills | Status: AC | PRN

## 2022-07-11 MED ORDER — METFORMIN HCL ER 500 MG PO TB24: ORAL_TABLET | ORAL | 1 refills | Status: DC

## 2022-07-11 NOTE — Telephone Encounter (Signed)
Refills sent to pharmacy. 

## 2022-07-11 NOTE — Telephone Encounter (Signed)
Pt stated he needs all RX on 90 day supply,the patient stated ozempic is needed for 90 days  silver sulfADIAZINE (SILVADENE) 1 % cream   --  10/07/18  --  Corwin Levins, MD    Apply 1 application topically daily.  Semaglutide, 1 MG/DOSE, 4 MG/3ML SOPN pantoprazole (PROTONIX) 40 MG tablet   --  03/31/17  --  Corwin Levins, MD    Take 1 tablet (40 mg total) by mouth daily before breakfast.  Omega-3 Fatty Acids (FISH OIL) 500 MG CAPS  metFORMIN (GLUCOPHAGE-XR) 500 MG 24 hr tablet losartan (COZAAR) 100 MG tabletloratadine (CLARITIN) 10 MG tabletCoenzyme Q10 (CO Q 10 PO)citalopram  (CELEXA) 20 MG tablet atorvastatin (LIPITOR) 10 MG tablet aspirin 325 MG tabletamLODipine (NORVASC) 10 MG tablet   [id number] [place], [place] - [address] AT [facility] [address] [facility] [address] Phone:  [phone]

## 2022-08-28 ENCOUNTER — Other Ambulatory Visit: Payer: Self-pay | Admitting: Internal Medicine

## 2022-09-09 ENCOUNTER — Telehealth: Payer: Self-pay | Admitting: Pulmonary Disease

## 2022-09-10 NOTE — Telephone Encounter (Signed)
Printed off last cpap order. Called patient and he states he will pick it up tomorrow on Friday. Nothing further needed

## 2022-09-10 NOTE — Telephone Encounter (Signed)
Patient is wanting an rx of cpap machine for him to get an additional machine that he will pay for out of pocket.  Sir are you ok with current cpap orders and settings for patient  Please advise

## 2022-09-10 NOTE — Telephone Encounter (Signed)
Okay with current CPAP settings

## 2022-09-14 IMAGING — CT CT CARDIAC CORONARY ARTERY CALCIUM SCORE
3 series · 14 of 20 positions shown, 15 images · non-contrast
Comparison: None.
COMPARISON: None.

Addendum:
EXAM:
OVER-READ INTERPRETATION  CT CHEST

The following report is an over-read performed by radiologist Dr.
W-K Uhm [REDACTED] on 03/13/2021. This
over-read does not include interpretation of cardiac or coronary
anatomy or pathology. The coronary calcium score interpretation by
the cardiologist is attached.
CLINICAL DATA: Cardiovascular Disease Risk stratification
Coronary Calcium Score
TECHNIQUE: A gated, non-contrast computed tomography scan of the heart was
performed using 3mm slice thickness. Axial images were analyzed on a
dedicated workstation. Calcium scoring of the coronary arteries was
performed using the Agatston method.

[Series 2: casc 3.0 bv41 2 bestsyst 42 % · axial · 0.44mm/px · z∈[-244,-163]mm · 4 of 45 slices shown, 5 images]
[im 9/45  vessel]
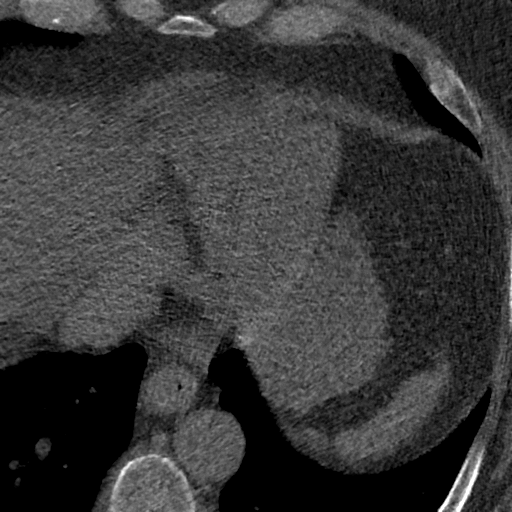
[im 9/45  lung]
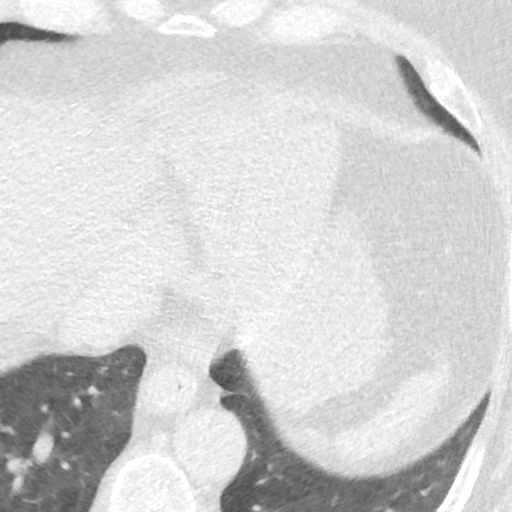
[im 18/45  vessel]
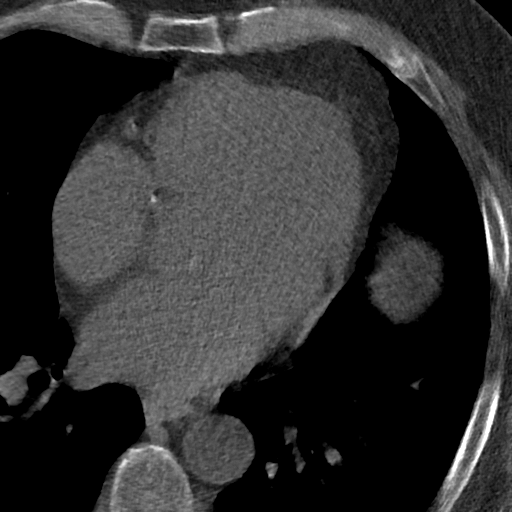
[im 27/45  vessel]
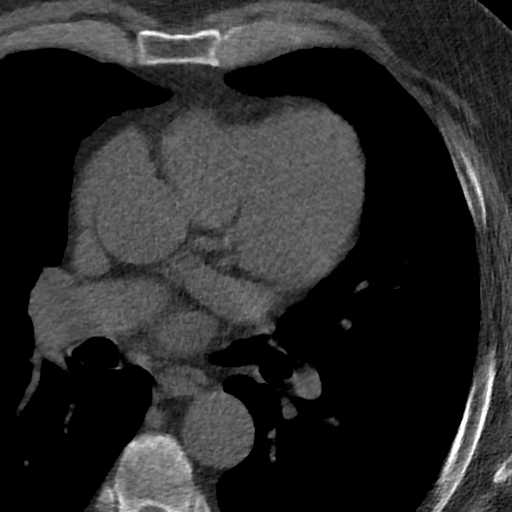
[im 36/45  vessel]
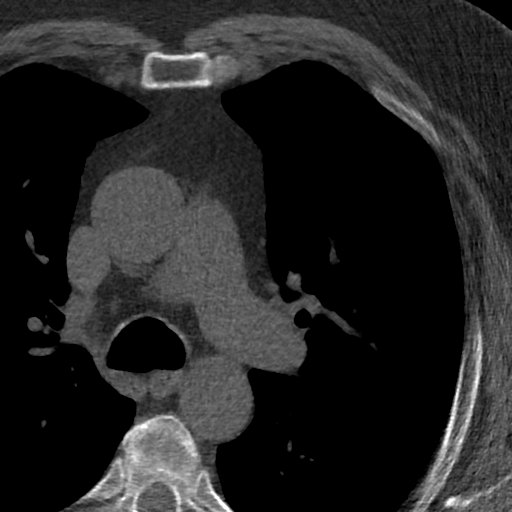

[Series 3: lung 42 % · axial · 0.72mm/px · z∈[-247,-160]mm · 5 of 45 slices shown]
[im 8/45  lung]
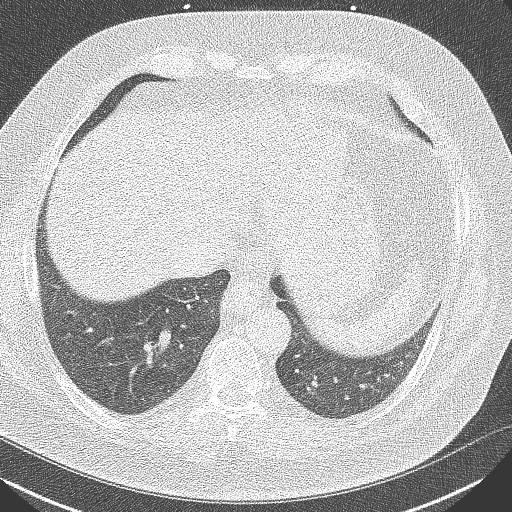
[im 15/45  lung]
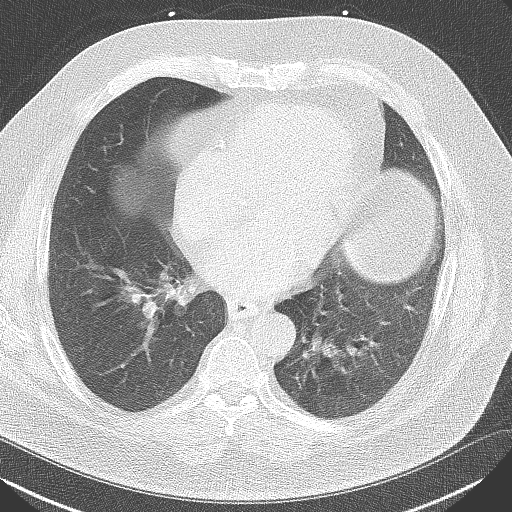
[im 23/45  lung]
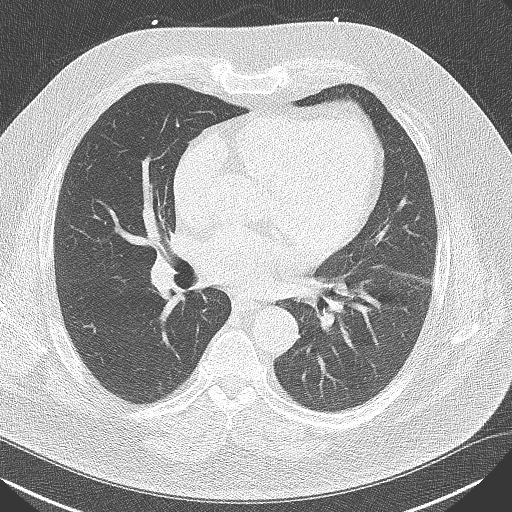
[im 30/45  lung]
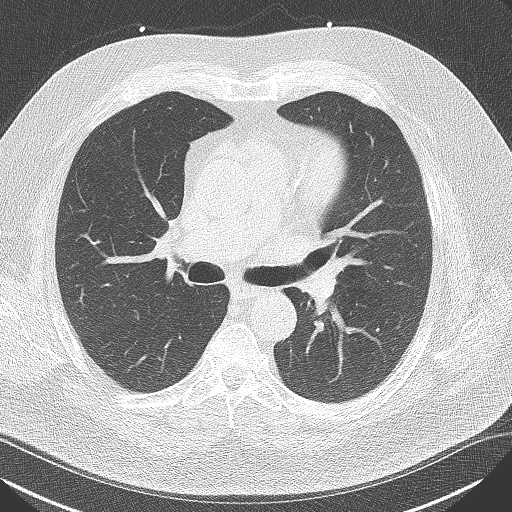
[im 37/45  lung]
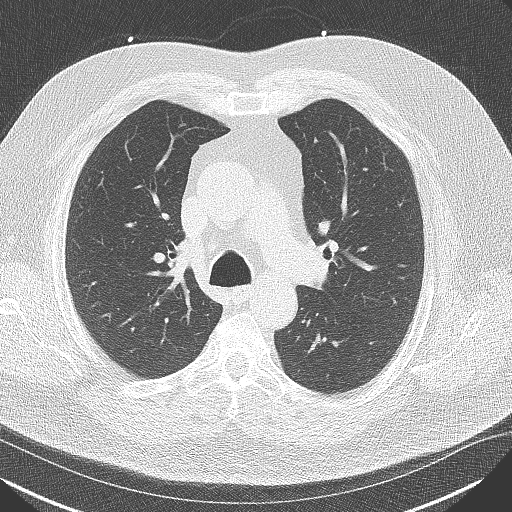

[Series 4: lung st 42 % · axial · 0.72mm/px · z∈[-247,-160]mm · 5 of 45 slices shown]
[im 8/45  lung]
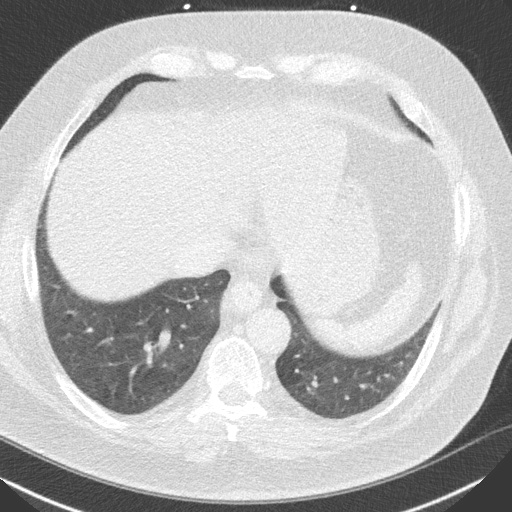
[im 15/45  lung]
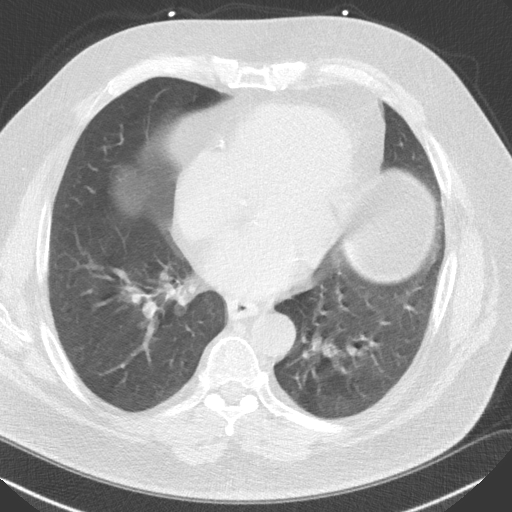
[im 23/45  lung]
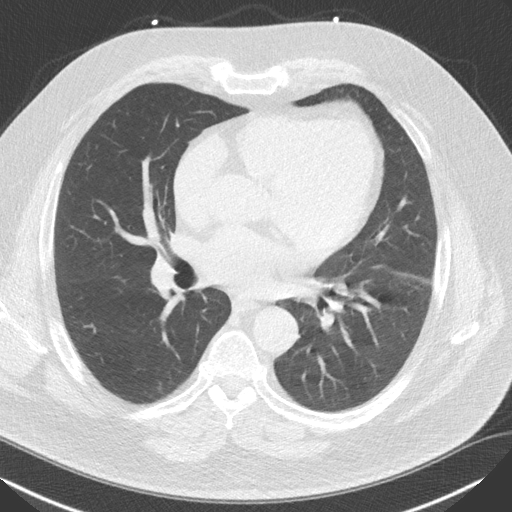
[im 30/45  lung]
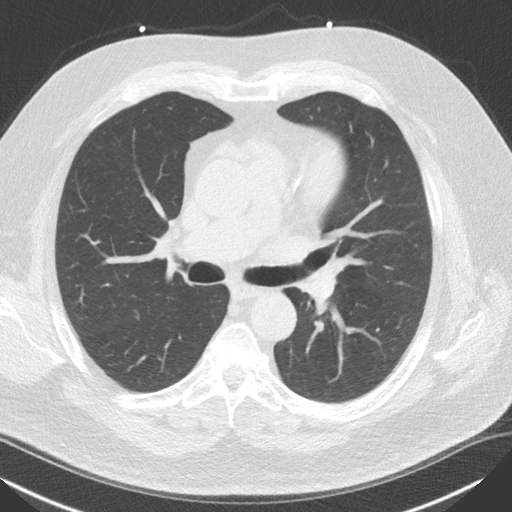
[im 37/45  lung]
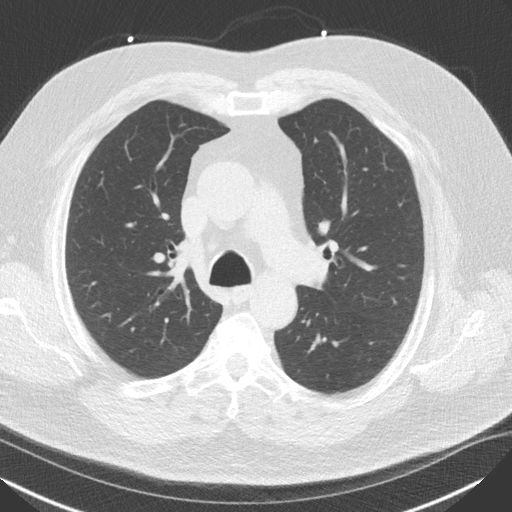

[14 of 20 positions shown; findings below may reference images not displayed]

FINDINGS: Aortic atherosclerosis. Within the visualized portions of the thorax
there are no suspicious appearing pulmonary nodules or masses, there
is no acute consolidative airspace disease, no pleural effusions, no
pneumothorax and no lymphadenopathy. Visualized portions of the
upper abdomen demonstrates diffuse low attenuation throughout the
visualized hepatic parenchyma, indicative of hepatic steatosis.
There are no aggressive appearing lytic or blastic lesions noted in
the visualized portions of the skeleton.
IMPRESSION: 1.  Aortic Atherosclerosis (3085A-WKF.F).
2. Hepatic steatosis.
FINDINGS: Coronary arteries: Normal origins.

Coronary Calcium Score:

Left main: 0

Left anterior descending artery: 165

Left circumflex artery: 0

Right coronary artery:

Total: 168

Percentile: 59th

Pericardium: Normal.

Ascending Aorta: Mildly dilated at 4.1cm at the level of the
bifurcation of the main pulmonary artery. Scattered calcifications
in the aortic arch and descending thoracic aorta.

Non-cardiac: See separate report from [REDACTED].
IMPRESSION: Coronary calcium score of 168. This was 59th percentile for age-,
race-, and sex-matched controls.



If CAC=0, it is reasonable to withhold statin therapy and reassess
in 5 to 10 years, as long as higher risk conditions are absent
(diabetes mellitus, family history of premature CHD in first degree
relatives (males <55 years; females <65 years), cigarette smoking,
or LDL >=190 mg/dL).

If CAC is 1 to 99, it is reasonable to initiate statin therapy for
patients >=55 years of age.

If CAC is >=100 or >=75th percentile, it is reasonable to initiate
statin therapy at any age.

Cardiology referral should be considered for patients with CAC
scores >=400 or >=75th percentile.

*0042 AHA/ACC/AACVPR/AAPA/ABC/FATKA/EZEH/MATHIS/Jonsson/ONO/ZIGZAG/ZEFERINO
Guideline on the Management of Blood Cholesterol: A Report of the
American College of Cardiology/American Heart Association Task Force
on Clinical Practice Guidelines. J Am Coll Cardiol.
4328;73(24):8331-8302.

*** End of Addendum ***
EXAM:
OVER-READ INTERPRETATION  CT CHEST

The following report is an over-read performed by radiologist Dr.
W-K Uhm [REDACTED] on 03/13/2021. This
over-read does not include interpretation of cardiac or coronary
anatomy or pathology. The coronary calcium score interpretation by
the cardiologist is attached.
FINDINGS: Aortic atherosclerosis. Within the visualized portions of the thorax
there are no suspicious appearing pulmonary nodules or masses, there
is no acute consolidative airspace disease, no pleural effusions, no
pneumothorax and no lymphadenopathy. Visualized portions of the
upper abdomen demonstrates diffuse low attenuation throughout the
visualized hepatic parenchyma, indicative of hepatic steatosis.
There are no aggressive appearing lytic or blastic lesions noted in
the visualized portions of the skeleton.
IMPRESSION: 1.  Aortic Atherosclerosis (3085A-WKF.F).
2. Hepatic steatosis.

## 2022-10-07 ENCOUNTER — Other Ambulatory Visit: Payer: Self-pay | Admitting: Internal Medicine

## 2022-10-19 ENCOUNTER — Emergency Department (HOSPITAL_BASED_OUTPATIENT_CLINIC_OR_DEPARTMENT_OTHER): Payer: No Typology Code available for payment source

## 2022-10-19 ENCOUNTER — Encounter (HOSPITAL_BASED_OUTPATIENT_CLINIC_OR_DEPARTMENT_OTHER): Payer: Self-pay

## 2022-10-19 ENCOUNTER — Inpatient Hospital Stay (HOSPITAL_BASED_OUTPATIENT_CLINIC_OR_DEPARTMENT_OTHER)
Admission: EM | Admit: 2022-10-19 | Discharge: 2022-10-23 | DRG: 552 | Disposition: A | Discharge status: Rehabilitation Facility | Payer: No Typology Code available for payment source | Attending: Internal Medicine | Admitting: Internal Medicine

## 2022-10-19 DIAGNOSIS — S32020A Wedge compression fracture of second lumbar vertebra, initial encounter for closed fracture: Secondary | ICD-10-CM | POA: Diagnosis not present

## 2022-10-19 DIAGNOSIS — F419 Anxiety disorder, unspecified: Secondary | ICD-10-CM | POA: Diagnosis present

## 2022-10-19 DIAGNOSIS — E785 Hyperlipidemia, unspecified: Secondary | ICD-10-CM | POA: Diagnosis present

## 2022-10-19 DIAGNOSIS — M549 Dorsalgia, unspecified: Secondary | ICD-10-CM

## 2022-10-19 DIAGNOSIS — Z79899 Other long term (current) drug therapy: Secondary | ICD-10-CM | POA: Diagnosis not present

## 2022-10-19 DIAGNOSIS — T1490XA Injury, unspecified, initial encounter: Secondary | ICD-10-CM | POA: Diagnosis not present

## 2022-10-19 DIAGNOSIS — S32001A Stable burst fracture of unspecified lumbar vertebra, initial encounter for closed fracture: Secondary | ICD-10-CM | POA: Diagnosis present

## 2022-10-19 DIAGNOSIS — S32001D Stable burst fracture of unspecified lumbar vertebra, subsequent encounter for fracture with routine healing: Secondary | ICD-10-CM | POA: Diagnosis not present

## 2022-10-19 DIAGNOSIS — K5900 Constipation, unspecified: Secondary | ICD-10-CM | POA: Diagnosis not present

## 2022-10-19 DIAGNOSIS — Y92410 Unspecified street and highway as the place of occurrence of the external cause: Secondary | ICD-10-CM

## 2022-10-19 DIAGNOSIS — Z880 Allergy status to penicillin: Secondary | ICD-10-CM | POA: Diagnosis not present

## 2022-10-19 DIAGNOSIS — M48061 Spinal stenosis, lumbar region without neurogenic claudication: Secondary | ICD-10-CM | POA: Diagnosis not present

## 2022-10-19 DIAGNOSIS — R5381 Other malaise: Secondary | ICD-10-CM | POA: Diagnosis present

## 2022-10-19 DIAGNOSIS — Z825 Family history of asthma and other chronic lower respiratory diseases: Secondary | ICD-10-CM | POA: Diagnosis not present

## 2022-10-19 DIAGNOSIS — Z041 Encounter for examination and observation following transport accident: Secondary | ICD-10-CM | POA: Diagnosis not present

## 2022-10-19 DIAGNOSIS — S32021D Stable burst fracture of second lumbar vertebra, subsequent encounter for fracture with routine healing: Secondary | ICD-10-CM | POA: Diagnosis not present

## 2022-10-19 DIAGNOSIS — S32019D Unspecified fracture of first lumbar vertebra, subsequent encounter for fracture with routine healing: Secondary | ICD-10-CM | POA: Diagnosis not present

## 2022-10-19 DIAGNOSIS — S32021A Stable burst fracture of second lumbar vertebra, initial encounter for closed fracture: Secondary | ICD-10-CM | POA: Diagnosis not present

## 2022-10-19 DIAGNOSIS — R001 Bradycardia, unspecified: Secondary | ICD-10-CM | POA: Diagnosis not present

## 2022-10-19 DIAGNOSIS — Z6835 Body mass index (BMI) 35.0-35.9, adult: Secondary | ICD-10-CM | POA: Diagnosis not present

## 2022-10-19 DIAGNOSIS — E119 Type 2 diabetes mellitus without complications: Secondary | ICD-10-CM | POA: Diagnosis not present

## 2022-10-19 DIAGNOSIS — Z7982 Long term (current) use of aspirin: Secondary | ICD-10-CM

## 2022-10-19 DIAGNOSIS — R42 Dizziness and giddiness: Secondary | ICD-10-CM | POA: Diagnosis not present

## 2022-10-19 DIAGNOSIS — R52 Pain, unspecified: Secondary | ICD-10-CM | POA: Diagnosis not present

## 2022-10-19 DIAGNOSIS — M545 Low back pain, unspecified: Secondary | ICD-10-CM | POA: Diagnosis not present

## 2022-10-19 DIAGNOSIS — G4733 Obstructive sleep apnea (adult) (pediatric): Secondary | ICD-10-CM | POA: Diagnosis present

## 2022-10-19 DIAGNOSIS — I10 Essential (primary) hypertension: Secondary | ICD-10-CM | POA: Diagnosis not present

## 2022-10-19 DIAGNOSIS — S32009A Unspecified fracture of unspecified lumbar vertebra, initial encounter for closed fracture: Secondary | ICD-10-CM | POA: Diagnosis not present

## 2022-10-19 DIAGNOSIS — E1169 Type 2 diabetes mellitus with other specified complication: Secondary | ICD-10-CM | POA: Diagnosis not present

## 2022-10-19 DIAGNOSIS — S0990XA Unspecified injury of head, initial encounter: Secondary | ICD-10-CM | POA: Diagnosis not present

## 2022-10-19 DIAGNOSIS — Z6834 Body mass index (BMI) 34.0-34.9, adult: Secondary | ICD-10-CM | POA: Diagnosis not present

## 2022-10-19 DIAGNOSIS — S32021S Stable burst fracture of second lumbar vertebra, sequela: Secondary | ICD-10-CM | POA: Diagnosis not present

## 2022-10-19 DIAGNOSIS — K219 Gastro-esophageal reflux disease without esophagitis: Secondary | ICD-10-CM

## 2022-10-19 DIAGNOSIS — Z8249 Family history of ischemic heart disease and other diseases of the circulatory system: Secondary | ICD-10-CM | POA: Diagnosis not present

## 2022-10-19 DIAGNOSIS — E669 Obesity, unspecified: Secondary | ICD-10-CM | POA: Diagnosis not present

## 2022-10-19 DIAGNOSIS — F32A Depression, unspecified: Secondary | ICD-10-CM | POA: Diagnosis present

## 2022-10-19 DIAGNOSIS — S32001S Stable burst fracture of unspecified lumbar vertebra, sequela: Secondary | ICD-10-CM | POA: Diagnosis not present

## 2022-10-19 DIAGNOSIS — Z7984 Long term (current) use of oral hypoglycemic drugs: Secondary | ICD-10-CM | POA: Diagnosis not present

## 2022-10-19 DIAGNOSIS — Z8673 Personal history of transient ischemic attack (TIA), and cerebral infarction without residual deficits: Secondary | ICD-10-CM | POA: Diagnosis not present

## 2022-10-19 DIAGNOSIS — I700 Atherosclerosis of aorta: Secondary | ICD-10-CM | POA: Diagnosis present

## 2022-10-19 DIAGNOSIS — F411 Generalized anxiety disorder: Secondary | ICD-10-CM | POA: Diagnosis not present

## 2022-10-19 LAB — CBC
HCT: 44.4 % (ref 39.0–52.0)
Hemoglobin: 14.6 g/dL (ref 13.0–17.0)
MCH: 29.4 pg (ref 26.0–34.0)
MCHC: 32.9 g/dL (ref 30.0–36.0)
MCV: 89.3 fL (ref 80.0–100.0)
Platelets: 161 10*3/uL (ref 150–400)
RBC: 4.97 MIL/uL (ref 4.22–5.81)
RDW: 12.7 % (ref 11.5–15.5)
WBC: 11.9 10*3/uL — ABNORMAL HIGH (ref 4.0–10.5)
nRBC: 0 % (ref 0.0–0.2)

## 2022-10-19 LAB — BASIC METABOLIC PANEL
Anion gap: 13 (ref 5–15)
BUN: 20 mg/dL (ref 8–23)
CO2: 24 mmol/L (ref 22–32)
Calcium: 9.1 mg/dL (ref 8.9–10.3)
Chloride: 104 mmol/L (ref 98–111)
Creatinine, Ser: 1.1 mg/dL (ref 0.61–1.24)
GFR, Estimated: >60 mL/min (ref >60)
Glucose, Bld: 170 mg/dL — ABNORMAL HIGH (ref 70–99)
Potassium: 3.8 mmol/L (ref 3.5–5.1)
Sodium: 141 mmol/L (ref 135–145)

## 2022-10-19 MED ORDER — HYDROMORPHONE HCL 1 MG/ML IJ SOLN
1.0000 mg | Freq: Once | INTRAMUSCULAR | Status: AC
  Administered 2022-10-19: 1 mg via INTRAVENOUS
  Filled 2022-10-19: qty 1

## 2022-10-19 MED ORDER — IBUPROFEN 600 MG PO TABS: 600.0000 mg | ORAL_TABLET | Freq: Four times a day (QID) | ORAL | 0 refills | Status: DC | PRN

## 2022-10-19 MED ORDER — KETOROLAC TROMETHAMINE 30 MG/ML IJ SOLN
30.0000 mg | Freq: Once | INTRAMUSCULAR | Status: AC
  Administered 2022-10-19: 30 mg via INTRAVENOUS
  Filled 2022-10-19: qty 1

## 2022-10-19 MED ORDER — OXYCODONE HCL 5 MG PO TABS: 5.0000 mg | ORAL_TABLET | Freq: Four times a day (QID) | ORAL | 0 refills | Status: DC | PRN

## 2022-10-19 MED ORDER — DEXAMETHASONE SODIUM PHOSPHATE 10 MG/ML IJ SOLN
6.0000 mg | Freq: Once | INTRAMUSCULAR | Status: AC
  Administered 2022-10-20: 6 mg via INTRAVENOUS
  Filled 2022-10-19: qty 1
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
## 2022-10-19 NOTE — Progress Notes (Signed)
Orthopedic Tech Progress Note Patient Details:  GLENNIS MONTENEGRO 11-16-1951 [id number]  Patient ID: Joeseph Amor, male   DOB: 1951-09-07, 71 y.o.   MRN: [id number] I called order into hanger Karolee Stamps 10/19/2022, 11:38 PM

## 2022-10-19 NOTE — ED Notes (Signed)
Pt given Pain Meds. SpO2 dropped to 87% (had originally been WNL). O2 applied at 2 LPM. RT notified.

## 2022-10-19 NOTE — ED Notes (Signed)
Bruising on the middle of his abdomen.Soft and no tenderness.

## 2022-10-20 ENCOUNTER — Other Ambulatory Visit: Payer: Self-pay

## 2022-10-20 ENCOUNTER — Emergency Department (HOSPITAL_BASED_OUTPATIENT_CLINIC_OR_DEPARTMENT_OTHER): Payer: No Typology Code available for payment source

## 2022-10-20 ENCOUNTER — Encounter (HOSPITAL_BASED_OUTPATIENT_CLINIC_OR_DEPARTMENT_OTHER): Payer: Self-pay | Admitting: Internal Medicine

## 2022-10-20 ENCOUNTER — Encounter (HOSPITAL_COMMUNITY): Payer: Self-pay

## 2022-10-20 ENCOUNTER — Telehealth: Payer: Self-pay | Admitting: Internal Medicine

## 2022-10-20 DIAGNOSIS — M549 Dorsalgia, unspecified: Secondary | ICD-10-CM

## 2022-10-20 DIAGNOSIS — Z7984 Long term (current) use of oral hypoglycemic drugs: Secondary | ICD-10-CM | POA: Diagnosis not present

## 2022-10-20 DIAGNOSIS — Y92410 Unspecified street and highway as the place of occurrence of the external cause: Secondary | ICD-10-CM | POA: Diagnosis not present

## 2022-10-20 DIAGNOSIS — I10 Essential (primary) hypertension: Secondary | ICD-10-CM | POA: Diagnosis not present

## 2022-10-20 DIAGNOSIS — T1490XA Injury, unspecified, initial encounter: Secondary | ICD-10-CM | POA: Diagnosis not present

## 2022-10-20 DIAGNOSIS — G4733 Obstructive sleep apnea (adult) (pediatric): Secondary | ICD-10-CM | POA: Diagnosis not present

## 2022-10-20 DIAGNOSIS — E119 Type 2 diabetes mellitus without complications: Secondary | ICD-10-CM | POA: Diagnosis not present

## 2022-10-20 DIAGNOSIS — F419 Anxiety disorder, unspecified: Secondary | ICD-10-CM | POA: Diagnosis not present

## 2022-10-20 DIAGNOSIS — Z8249 Family history of ischemic heart disease and other diseases of the circulatory system: Secondary | ICD-10-CM | POA: Diagnosis not present

## 2022-10-20 DIAGNOSIS — S32021A Stable burst fracture of second lumbar vertebra, initial encounter for closed fracture: Secondary | ICD-10-CM | POA: Diagnosis not present

## 2022-10-20 DIAGNOSIS — Z880 Allergy status to penicillin: Secondary | ICD-10-CM | POA: Diagnosis not present

## 2022-10-20 DIAGNOSIS — K5900 Constipation, unspecified: Secondary | ICD-10-CM | POA: Diagnosis not present

## 2022-10-20 DIAGNOSIS — K219 Gastro-esophageal reflux disease without esophagitis: Secondary | ICD-10-CM | POA: Diagnosis not present

## 2022-10-20 DIAGNOSIS — Z041 Encounter for examination and observation following transport accident: Secondary | ICD-10-CM | POA: Diagnosis not present

## 2022-10-20 DIAGNOSIS — Z79899 Other long term (current) drug therapy: Secondary | ICD-10-CM | POA: Diagnosis not present

## 2022-10-20 DIAGNOSIS — Z8673 Personal history of transient ischemic attack (TIA), and cerebral infarction without residual deficits: Secondary | ICD-10-CM | POA: Diagnosis not present

## 2022-10-20 DIAGNOSIS — Z7982 Long term (current) use of aspirin: Secondary | ICD-10-CM | POA: Diagnosis not present

## 2022-10-20 DIAGNOSIS — Z6834 Body mass index (BMI) 34.0-34.9, adult: Secondary | ICD-10-CM | POA: Diagnosis not present

## 2022-10-20 DIAGNOSIS — S32001A Stable burst fracture of unspecified lumbar vertebra, initial encounter for closed fracture: Secondary | ICD-10-CM | POA: Diagnosis present

## 2022-10-20 DIAGNOSIS — Z825 Family history of asthma and other chronic lower respiratory diseases: Secondary | ICD-10-CM | POA: Diagnosis not present

## 2022-10-20 DIAGNOSIS — E785 Hyperlipidemia, unspecified: Secondary | ICD-10-CM | POA: Diagnosis not present

## 2022-10-20 LAB — CBC WITH DIFFERENTIAL/PLATELET
Abs Immature Granulocytes: 0.05 10*3/uL (ref 0.00–0.07)
Basophils Absolute: 0 10*3/uL (ref 0.0–0.1)
Basophils Relative: 0 %
Eosinophils Absolute: 0 10*3/uL (ref 0.0–0.5)
Eosinophils Relative: 0 %
HCT: 44.3 % (ref 39.0–52.0)
Hemoglobin: 14.6 g/dL (ref 13.0–17.0)
Immature Granulocytes: 1 %
Lymphocytes Relative: 5 %
Lymphs Abs: 0.5 10*3/uL — ABNORMAL LOW (ref 0.7–4.0)
MCH: 29.3 pg (ref 26.0–34.0)
MCHC: 33 g/dL (ref 30.0–36.0)
MCV: 89 fL (ref 80.0–100.0)
Monocytes Absolute: 0.7 10*3/uL (ref 0.1–1.0)
Monocytes Relative: 7 %
Neutro Abs: 9 10*3/uL — ABNORMAL HIGH (ref 1.7–7.7)
Neutrophils Relative %: 87 %
Platelets: 201 10*3/uL (ref 150–400)
RBC: 4.98 MIL/uL (ref 4.22–5.81)
RDW: 12.8 % (ref 11.5–15.5)
WBC: 10.2 10*3/uL (ref 4.0–10.5)
nRBC: 0 % (ref 0.0–0.2)

## 2022-10-20 LAB — COMPREHENSIVE METABOLIC PANEL
ALT: 34 U/L (ref 0–44)
AST: 34 U/L (ref 15–41)
Albumin: 4.3 g/dL (ref 3.5–5.0)
Alkaline Phosphatase: 56 U/L (ref 38–126)
Anion gap: 11 (ref 5–15)
BUN: 19 mg/dL (ref 8–23)
CO2: 22 mmol/L (ref 22–32)
Calcium: 9.1 mg/dL (ref 8.9–10.3)
Chloride: 107 mmol/L (ref 98–111)
Creatinine, Ser: 0.89 mg/dL (ref 0.61–1.24)
GFR, Estimated: >60 mL/min (ref >60)
Glucose, Bld: 152 mg/dL — ABNORMAL HIGH (ref 70–99)
Potassium: 4.1 mmol/L (ref 3.5–5.1)
Sodium: 140 mmol/L (ref 135–145)
Total Bilirubin: 1.1 mg/dL (ref 0.3–1.2)
Total Protein: 7.2 g/dL (ref 6.5–8.1)

## 2022-10-20 LAB — HEMOGLOBIN A1C
Hgb A1c MFr Bld: 5.8 % — ABNORMAL HIGH (ref 4.8–5.6)
Mean Plasma Glucose: 119.76 mg/dL

## 2022-10-20 LAB — HIV ANTIBODY (ROUTINE TESTING W REFLEX): HIV Screen 4th Generation wRfx: NONREACTIVE

## 2022-10-20 LAB — GLUCOSE, CAPILLARY
Glucose-Capillary: 134 mg/dL — ABNORMAL HIGH (ref 70–99)
Glucose-Capillary: 144 mg/dL — ABNORMAL HIGH (ref 70–99)

## 2022-10-20 MED ORDER — IOHEXOL 300 MG/ML  SOLN
100.0000 mL | Freq: Once | INTRAMUSCULAR | Status: AC | PRN
  Administered 2022-10-20: 100 mL via INTRAVENOUS

## 2022-10-20 MED ORDER — ACETAMINOPHEN 650 MG RE SUPP: 650.0000 mg | Freq: Four times a day (QID) | RECTAL | Status: DC | PRN

## 2022-10-20 MED ORDER — HYDROMORPHONE HCL 1 MG/ML IJ SOLN
1.0000 mg | Freq: Once | INTRAMUSCULAR | Status: AC
  Administered 2022-10-20: 1 mg via INTRAVENOUS
  Filled 2022-10-20: qty 1

## 2022-10-20 MED ORDER — OXYCODONE HCL 5 MG PO TABS
5.0000 mg | ORAL_TABLET | ORAL | Status: DC | PRN
  Administered 2022-10-20 – 2022-10-21 (×3): 5 mg via ORAL
  Filled 2022-10-20 (×3): qty 1

## 2022-10-20 MED ORDER — ONDANSETRON HCL 4 MG PO TABS: 4.0000 mg | ORAL_TABLET | Freq: Four times a day (QID) | ORAL | Status: DC | PRN

## 2022-10-20 MED ORDER — ACETAMINOPHEN 325 MG PO TABS: 650.0000 mg | ORAL_TABLET | Freq: Four times a day (QID) | ORAL | Status: DC | PRN

## 2022-10-20 MED ORDER — ONDANSETRON HCL 4 MG/2ML IJ SOLN: 4.0000 mg | Freq: Four times a day (QID) | INTRAMUSCULAR | Status: DC | PRN

## 2022-10-20 MED ORDER — HYDROMORPHONE HCL 1 MG/ML IJ SOLN
1.0000 mg | INTRAMUSCULAR | Status: DC | PRN
  Administered 2022-10-20 (×2): 1 mg via INTRAVENOUS
  Filled 2022-10-20 (×2): qty 1

## 2022-10-20 MED ORDER — INSULIN ASPART 100 UNIT/ML IJ SOLN: 0.0000 [IU] | Freq: Every day | INTRAMUSCULAR | Status: DC

## 2022-10-20 MED ORDER — INSULIN ASPART 100 UNIT/ML IJ SOLN
0.0000 [IU] | Freq: Three times a day (TID) | INTRAMUSCULAR | Status: DC
  Administered 2022-10-20 – 2022-10-22 (×5): 2 [IU] via SUBCUTANEOUS

## 2022-10-20 MED ORDER — PANTOPRAZOLE SODIUM 40 MG PO TBEC
40.0000 mg | DELAYED_RELEASE_TABLET | Freq: Every day | ORAL | Status: DC
  Filled 2022-10-20 (×3): qty 1

## 2022-10-20 NOTE — ED Provider Notes (Signed)
  Physical Exam  BP 127/81 (BP Location: Right Arm)   Pulse 100   Temp 98.5 F (36.9 C) (Oral)   Resp 18   Ht 6\' 3"  (1.905 m)   Wt 126.6 kg   SpO2 96%   BMI 34.87 kg/m   Physical Exam Vitals and nursing note reviewed.  Constitutional:      General: He is not in acute distress.    Appearance: He is well-developed. He is not diaphoretic.  HENT:     Head: Normocephalic and atraumatic.  Cardiovascular:     Rate and Rhythm: Normal rate and regular rhythm.     Heart sounds: No murmur heard.    No friction rub.  Pulmonary:     Effort: Pulmonary effort is normal. No respiratory distress.     Breath sounds: Normal breath sounds. No wheezing or rales.  Abdominal:     General: Bowel sounds are normal. There is no distension.     Palpations: Abdomen is soft.     Tenderness: There is no abdominal tenderness.  Musculoskeletal:        General: Normal range of motion.     Cervical back: Normal range of motion and neck supple.  Skin:    General: Skin is warm and dry.  Neurological:     Mental Status: He is alert and oriented to person, place, and time.     Coordination: Coordination normal.     Procedures  Procedures  ED Course / MDM   Clinical Course as of 10/20/22 0242  Sun Oct 19, 2022  2353 Patient was reassessed following CT imaging.  He has transverse process fractures of L1-L2, as well as a burst fracture of L2, 20% loss of height, some mild retropulsion into the spinal canal.  On repeat neuro exam, he maintains excellent strength and sensation in the lower legs, does not demonstrate any neurological deficits.  I have ordered a TLSO brace.  If the patient is able to ambulate with the brace, he can follow-up with neurosurgery as an outpatient.  However if he is limited by pain, I would recommend medical admission [MT]  2354 Patient signed out to Dr Stark Jock EDP pending TSLO brace and trial for ambulation [MT]    Clinical Course User Index [MT] Trifan, Carola Rhine, MD   Medical  Decision Making Amount and/or Complexity of Data Reviewed Labs: ordered. Radiology: ordered. ECG/medicine tests: ordered.  Risk Prescription drug management. Decision regarding hospitalization.   Care assumed from Dr. Langston Masker at shift change.  Patient awaiting application of a TLSO brace and trial of ambulation.  The brace was applied, however patient unable to ambulate without severe pain.  He became diaphoretic and near syncopal.  Care discussed with Dr. Kieth Brightly from trauma surgery who does not feel as though this is an admission for trauma as it is an isolated spine injury.  I have discussed with Dr. Marlowe Sax from the hospitalist service who agrees to admit for pain control.       Veryl Speak, MD 10/20/22 [phone]

## 2022-10-20 NOTE — Telephone Encounter (Signed)
Patient was in [place] yesterday and is currently at [facility] ED waiting for a room at [facility] - has lower back injury - has had CT scans. Patient has been unable to take metformin and they are not allowing him to have food. Patient wants to eat something and take his metformin. He wants to know what Dr. Jenny Reichmann thinks as far as the Ed keeping him NPO. Requesting call back at [phone].

## 2022-10-20 NOTE — ED Notes (Signed)
Pt requested assistance to stand to use the urinal. Pt was unable to be weightbearing in order to use the urinal.

## 2022-10-20 NOTE — ED Notes (Signed)
Patient resting with Home CPAP unit in place. SpO2 87%-90% on Room Air. Patient placed on 2L with CPAP.
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
## 2022-10-20 NOTE — Progress Notes (Signed)
  Transition of Care [facility]) Screening Note   Patient Details  Name: Danny [name] Date of Birth: 1951/12/14   Transition of Care [facility] CM/SW Contact:    Dessa Phi, RN Phone Number: 10/20/2022, 3:46 PM    Transition of Care Department [facility]) has reviewed patient and no TOC needs have been identified at this time. We will continue to monitor patient advancement through interdisciplinary progression rounds. If new patient transition needs arise, please place a TOC consult.

## 2022-10-20 NOTE — Telephone Encounter (Signed)
Please advise on behalf of [place].

## 2022-10-20 NOTE — Plan of Care (Signed)
TRH will assume care on arrival to accepting facility. Until arrival, care as per EDP. However, TRH available 24/7 for questions and assistance.  Nursing staff, please page TRH Admits and Consults ([phone]) as soon as the patient arrives to the hospital.   

## 2022-10-20 NOTE — ED Notes (Signed)
Phone Handoff Report provided to [facility] Transport Team 

## 2022-10-20 NOTE — ED Notes (Signed)
Pt c/o pain in back of 2/10 while laying down. States he is weak and would like to walk around some today. Will attempt to see how patient tolerates this.

## 2022-10-21 DIAGNOSIS — Y92410 Unspecified street and highway as the place of occurrence of the external cause: Secondary | ICD-10-CM | POA: Diagnosis not present

## 2022-10-21 DIAGNOSIS — S32019D Unspecified fracture of first lumbar vertebra, subsequent encounter for fracture with routine healing: Secondary | ICD-10-CM | POA: Diagnosis not present

## 2022-10-21 DIAGNOSIS — Z6834 Body mass index (BMI) 34.0-34.9, adult: Secondary | ICD-10-CM | POA: Diagnosis not present

## 2022-10-21 DIAGNOSIS — Z6835 Body mass index (BMI) 35.0-35.9, adult: Secondary | ICD-10-CM | POA: Diagnosis not present

## 2022-10-21 DIAGNOSIS — G4733 Obstructive sleep apnea (adult) (pediatric): Secondary | ICD-10-CM | POA: Diagnosis present

## 2022-10-21 DIAGNOSIS — Z8249 Family history of ischemic heart disease and other diseases of the circulatory system: Secondary | ICD-10-CM | POA: Diagnosis not present

## 2022-10-21 DIAGNOSIS — S32020A Wedge compression fracture of second lumbar vertebra, initial encounter for closed fracture: Secondary | ICD-10-CM | POA: Diagnosis not present

## 2022-10-21 DIAGNOSIS — S32009A Unspecified fracture of unspecified lumbar vertebra, initial encounter for closed fracture: Secondary | ICD-10-CM | POA: Diagnosis not present

## 2022-10-21 DIAGNOSIS — K5900 Constipation, unspecified: Secondary | ICD-10-CM | POA: Diagnosis present

## 2022-10-21 DIAGNOSIS — E119 Type 2 diabetes mellitus without complications: Secondary | ICD-10-CM | POA: Diagnosis present

## 2022-10-21 DIAGNOSIS — Z8673 Personal history of transient ischemic attack (TIA), and cerebral infarction without residual deficits: Secondary | ICD-10-CM | POA: Diagnosis not present

## 2022-10-21 DIAGNOSIS — S32021D Stable burst fracture of second lumbar vertebra, subsequent encounter for fracture with routine healing: Secondary | ICD-10-CM | POA: Diagnosis not present

## 2022-10-21 DIAGNOSIS — M549 Dorsalgia, unspecified: Secondary | ICD-10-CM | POA: Diagnosis not present

## 2022-10-21 DIAGNOSIS — Z825 Family history of asthma and other chronic lower respiratory diseases: Secondary | ICD-10-CM | POA: Diagnosis not present

## 2022-10-21 DIAGNOSIS — F32A Depression, unspecified: Secondary | ICD-10-CM | POA: Diagnosis present

## 2022-10-21 DIAGNOSIS — S32001A Stable burst fracture of unspecified lumbar vertebra, initial encounter for closed fracture: Secondary | ICD-10-CM | POA: Diagnosis present

## 2022-10-21 DIAGNOSIS — Z7982 Long term (current) use of aspirin: Secondary | ICD-10-CM | POA: Diagnosis not present

## 2022-10-21 DIAGNOSIS — S32001S Stable burst fracture of unspecified lumbar vertebra, sequela: Secondary | ICD-10-CM | POA: Diagnosis not present

## 2022-10-21 DIAGNOSIS — R5381 Other malaise: Secondary | ICD-10-CM | POA: Diagnosis present

## 2022-10-21 DIAGNOSIS — E785 Hyperlipidemia, unspecified: Secondary | ICD-10-CM | POA: Diagnosis present

## 2022-10-21 DIAGNOSIS — Z880 Allergy status to penicillin: Secondary | ICD-10-CM | POA: Diagnosis not present

## 2022-10-21 DIAGNOSIS — M48061 Spinal stenosis, lumbar region without neurogenic claudication: Secondary | ICD-10-CM | POA: Diagnosis present

## 2022-10-21 DIAGNOSIS — S32021A Stable burst fracture of second lumbar vertebra, initial encounter for closed fracture: Secondary | ICD-10-CM | POA: Diagnosis present

## 2022-10-21 DIAGNOSIS — K219 Gastro-esophageal reflux disease without esophagitis: Secondary | ICD-10-CM | POA: Diagnosis present

## 2022-10-21 DIAGNOSIS — I10 Essential (primary) hypertension: Secondary | ICD-10-CM | POA: Diagnosis present

## 2022-10-21 DIAGNOSIS — Z79899 Other long term (current) drug therapy: Secondary | ICD-10-CM | POA: Diagnosis not present

## 2022-10-21 DIAGNOSIS — Z7984 Long term (current) use of oral hypoglycemic drugs: Secondary | ICD-10-CM | POA: Diagnosis not present

## 2022-10-21 DIAGNOSIS — F411 Generalized anxiety disorder: Secondary | ICD-10-CM | POA: Diagnosis not present

## 2022-10-21 DIAGNOSIS — S32021S Stable burst fracture of second lumbar vertebra, sequela: Secondary | ICD-10-CM | POA: Diagnosis not present

## 2022-10-21 DIAGNOSIS — R52 Pain, unspecified: Secondary | ICD-10-CM | POA: Diagnosis not present

## 2022-10-21 DIAGNOSIS — S32001D Stable burst fracture of unspecified lumbar vertebra, subsequent encounter for fracture with routine healing: Secondary | ICD-10-CM | POA: Diagnosis not present

## 2022-10-21 DIAGNOSIS — F419 Anxiety disorder, unspecified: Secondary | ICD-10-CM | POA: Diagnosis present

## 2022-10-21 DIAGNOSIS — I700 Atherosclerosis of aorta: Secondary | ICD-10-CM | POA: Diagnosis present

## 2022-10-21 DIAGNOSIS — E1169 Type 2 diabetes mellitus with other specified complication: Secondary | ICD-10-CM | POA: Diagnosis not present

## 2022-10-21 DIAGNOSIS — E669 Obesity, unspecified: Secondary | ICD-10-CM | POA: Diagnosis not present

## 2022-10-21 LAB — GLUCOSE, CAPILLARY
Glucose-Capillary: 134 mg/dL — ABNORMAL HIGH (ref 70–99)
Glucose-Capillary: 124 mg/dL — ABNORMAL HIGH (ref 70–99)
Glucose-Capillary: 110 mg/dL — ABNORMAL HIGH (ref 70–99)
Glucose-Capillary: 111 mg/dL — ABNORMAL HIGH (ref 70–99)

## 2022-10-21 LAB — CBC
HCT: 41.3 % (ref 39.0–52.0)
Hemoglobin: 13.6 g/dL (ref 13.0–17.0)
MCH: 29.8 pg (ref 26.0–34.0)
MCHC: 32.9 g/dL (ref 30.0–36.0)
MCV: 90.6 fL (ref 80.0–100.0)
Platelets: 165 10*3/uL (ref 150–400)
RBC: 4.56 MIL/uL (ref 4.22–5.81)
RDW: 12.9 % (ref 11.5–15.5)
WBC: 9.5 10*3/uL (ref 4.0–10.5)
nRBC: 0 % (ref 0.0–0.2)

## 2022-10-21 LAB — COMPREHENSIVE METABOLIC PANEL
ALT: 28 U/L (ref 0–44)
AST: 28 U/L (ref 15–41)
Albumin: 3.9 g/dL (ref 3.5–5.0)
Alkaline Phosphatase: 44 U/L (ref 38–126)
Anion gap: 8 (ref 5–15)
BUN: 25 mg/dL — ABNORMAL HIGH (ref 8–23)
CO2: 23 mmol/L (ref 22–32)
Calcium: 8.8 mg/dL — ABNORMAL LOW (ref 8.9–10.3)
Chloride: 110 mmol/L (ref 98–111)
Creatinine, Ser: 1.03 mg/dL (ref 0.61–1.24)
GFR, Estimated: >60 mL/min (ref >60)
Glucose, Bld: 133 mg/dL — ABNORMAL HIGH (ref 70–99)
Potassium: 4.1 mmol/L (ref 3.5–5.1)
Sodium: 141 mmol/L (ref 135–145)
Total Bilirubin: 1.1 mg/dL (ref 0.3–1.2)
Total Protein: 6.4 g/dL — ABNORMAL LOW (ref 6.5–8.1)

## 2022-10-21 MED ORDER — SEMAGLUTIDE (1 MG/DOSE) 4 MG/3ML ~~LOC~~ SOPN
1.0000 mg | PEN_INJECTOR | SUBCUTANEOUS | Status: DC
  Administered 2022-10-22: 1 mg via INTRAMUSCULAR

## 2022-10-21 MED ORDER — ATORVASTATIN CALCIUM 10 MG PO TABS
10.0000 mg | ORAL_TABLET | Freq: Every day | ORAL | Status: DC
  Administered 2022-10-21: 10 mg via ORAL
  Filled 2022-10-21 (×2): qty 1

## 2022-10-21 MED ORDER — OXYCODONE HCL 5 MG PO TABS
5.0000 mg | ORAL_TABLET | ORAL | Status: DC | PRN
  Administered 2022-10-22 (×3): 5 mg via ORAL
  Filled 2022-10-21 (×3): qty 1

## 2022-10-21 MED ORDER — TRAMADOL HCL 50 MG PO TABS
50.0000 mg | ORAL_TABLET | Freq: Four times a day (QID) | ORAL | Status: DC | PRN
  Administered 2022-10-21: 50 mg via ORAL
  Filled 2022-10-21: qty 1

## 2022-10-21 MED ORDER — HYDROMORPHONE HCL 1 MG/ML IJ SOLN
1.0000 mg | INTRAMUSCULAR | Status: DC | PRN
  Administered 2022-10-21 – 2022-10-22 (×5): 1 mg via INTRAVENOUS
  Filled 2022-10-21 (×5): qty 1

## 2022-10-21 MED ORDER — METFORMIN HCL ER 500 MG PO TB24
500.0000 mg | ORAL_TABLET | Freq: Every day | ORAL | Status: DC
  Administered 2022-10-22 – 2022-10-23 (×2): 500 mg via ORAL
  Filled 2022-10-21 (×2): qty 1

## 2022-10-21 NOTE — Plan of Care (Signed)

## 2022-10-21 NOTE — Progress Notes (Signed)
? ?  Inpatient Rehab Admissions Coordinator : ? ?Per therapy recommendations, patient was screened for CIR candidacy by Letty Salvi RN MSN.  At this time patient appears to be a potential candidate for CIR. I will place a rehab consult per protocol for full assessment. Please call me with any questions. ? ?Pearlene Teat RN MSN ?Admissions Coordinator ?[phone] ?  ?

## 2022-10-21 NOTE — Care Management Obs Status (Signed)
MEDICARE OBSERVATION STATUS NOTIFICATION   Patient Details  Name: JAVARUS DORNER MRN: [id number] Date of Birth: 09-Jan-1951   Medicare Observation Status Notification Given:  Yes    MahabirJuliann Pulse, RN 10/21/2022, 10:42 AM
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
## 2022-10-22 DIAGNOSIS — S32001D Stable burst fracture of unspecified lumbar vertebra, subsequent encounter for fracture with routine healing: Secondary | ICD-10-CM

## 2022-10-22 DIAGNOSIS — F419 Anxiety disorder, unspecified: Secondary | ICD-10-CM | POA: Diagnosis not present

## 2022-10-22 DIAGNOSIS — S32020A Wedge compression fracture of second lumbar vertebra, initial encounter for closed fracture: Secondary | ICD-10-CM | POA: Diagnosis not present

## 2022-10-22 LAB — GLUCOSE, CAPILLARY
Glucose-Capillary: 136 mg/dL — ABNORMAL HIGH (ref 70–99)
Glucose-Capillary: 133 mg/dL — ABNORMAL HIGH (ref 70–99)
Glucose-Capillary: 131 mg/dL — ABNORMAL HIGH (ref 70–99)
Glucose-Capillary: 108 mg/dL — ABNORMAL HIGH (ref 70–99)

## 2022-10-22 MED ORDER — ATORVASTATIN CALCIUM 10 MG PO TABS
10.0000 mg | ORAL_TABLET | Freq: Every day | ORAL | Status: DC
  Administered 2022-10-22: 10 mg via ORAL
  Filled 2022-10-22: qty 1

## 2022-10-22 MED ORDER — AMLODIPINE BESYLATE 10 MG PO TABS
10.0000 mg | ORAL_TABLET | Freq: Every day | ORAL | Status: AC
  Administered 2022-10-22: 10 mg via ORAL
  Filled 2022-10-22: qty 1

## 2022-10-22 MED ORDER — OXYCODONE-ACETAMINOPHEN 5-325 MG PO TABS
1.0000 | ORAL_TABLET | Freq: Three times a day (TID) | ORAL | Status: DC | PRN
  Administered 2022-10-22: 1 via ORAL
  Filled 2022-10-22: qty 1

## 2022-10-22 MED ORDER — FISH OIL 500 MG PO CAPS: ORAL_CAPSULE | Freq: Two times a day (BID) | ORAL | Status: DC

## 2022-10-22 MED ORDER — HYDROMORPHONE HCL 1 MG/ML IJ SOLN: 0.5000 mg | INTRAMUSCULAR | Status: DC | PRN

## 2022-10-22 MED ORDER — LOSARTAN POTASSIUM 50 MG PO TABS
100.0000 mg | ORAL_TABLET | Freq: Every day | ORAL | Status: DC
  Administered 2022-10-22: 100 mg via ORAL
  Filled 2022-10-22 (×2): qty 2

## 2022-10-22 MED ORDER — OXYCODONE HCL 5 MG PO TABS
5.0000 mg | ORAL_TABLET | Freq: Two times a day (BID) | ORAL | Status: DC
  Administered 2022-10-22 – 2022-10-23 (×2): 5 mg via ORAL
  Filled 2022-10-22 (×2): qty 1

## 2022-10-22 MED ORDER — CO Q 10 10 MG PO CAPS: ORAL_CAPSULE | Freq: Every day | ORAL | Status: DC

## 2022-10-22 MED ORDER — LOSARTAN POTASSIUM 50 MG PO TABS
100.0000 mg | ORAL_TABLET | Freq: Every day | ORAL | Status: DC
  Filled 2022-10-22 (×2): qty 2

## 2022-10-22 MED ORDER — AMLODIPINE BESYLATE 10 MG PO TABS: 10.0000 mg | ORAL_TABLET | Freq: Every day | ORAL | Status: DC

## 2022-10-22 MED ORDER — LOSARTAN POTASSIUM 50 MG PO TABS
100.0000 mg | ORAL_TABLET | Freq: Every day | ORAL | Status: AC
  Administered 2022-10-22: 100 mg via ORAL
  Filled 2022-10-22: qty 2

## 2022-10-22 MED ORDER — LOSARTAN POTASSIUM 50 MG PO TABS: 100.0000 mg | ORAL_TABLET | Freq: Every day | ORAL | Status: DC

## 2022-10-22 NOTE — Progress Notes (Signed)
[facility] IP rehab admissions - I met with patient at the bedside.  We discussed rehab options.  I gave patient rehab handbook and talked about inpatient rehab.  I answered many questions.  Patient is interested in CIR.  Pending bed availability, can potentially admit to [facility] CIR tomorrow.  I will follow up in am for plans.  Call for questions.  [phone]
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
## 2022-10-22 NOTE — Progress Notes (Signed)
[facility] IP rehab admissions - I called patient room, but no answer.  I called patient's wife and discussed rehab options with his wife.  I will see patient this afternoon.  He is a candidate for inpatient rehab admission over the next 1-2 days depending on his pain and bed availability on rehab.  Call me for questions.  [phone]
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
## 2022-10-22 NOTE — Progress Notes (Signed)
Patient refused 1000 medications and he stated that he only takes those at night and he would not be taking them now. Medications were wasted in sharps bin.

## 2022-10-22 NOTE — Progress Notes (Signed)
PHARMACIST - PHYSICIAN ORDER COMMUNICATION  CONCERNING: P&T Medication Policy on Herbal Medications  DESCRIPTION:  This patient's order for:  CoQ10 and Fish Oil  has been noted.  This product(s) is classified as an "herbal" or natural product. Due to a lack of definitive safety studies or FDA approval, nonstandard manufacturing practices, plus the potential risk of unknown drug-drug interactions while on inpatient medications, the Pharmacy and Therapeutics Committee does not permit the use of "herbal" or natural products of this type within [facility].   ACTION TAKEN: The pharmacy department is unable to verify this order at this time and your patient has been informed of this safety policy. Please reevaluate patient's clinical condition at discharge and address if the herbal or natural product(s) should be resumed at that time.   Anwen Cannedy S. Alford Highland, PharmD, BCPS Clinical Staff Pharmacist [email or web address]

## 2022-10-23 ENCOUNTER — Encounter (HOSPITAL_COMMUNITY): Payer: Self-pay | Admitting: Physical Medicine and Rehabilitation

## 2022-10-23 ENCOUNTER — Other Ambulatory Visit: Payer: Self-pay

## 2022-10-23 ENCOUNTER — Inpatient Hospital Stay (HOSPITAL_COMMUNITY)
Admission: RE | Admit: 2022-10-23 | Discharge: 2022-11-04 | DRG: 561 | Disposition: A | Discharge status: Home / Self Care | Payer: No Typology Code available for payment source | Source: Other Acute Inpatient Hospital | Attending: Physical Medicine and Rehabilitation | Admitting: Physical Medicine and Rehabilitation

## 2022-10-23 DIAGNOSIS — S32019D Unspecified fracture of first lumbar vertebra, subsequent encounter for fracture with routine healing: Secondary | ICD-10-CM | POA: Diagnosis not present

## 2022-10-23 DIAGNOSIS — F32A Depression, unspecified: Secondary | ICD-10-CM | POA: Diagnosis present

## 2022-10-23 DIAGNOSIS — S32001A Stable burst fracture of unspecified lumbar vertebra, initial encounter for closed fracture: Secondary | ICD-10-CM | POA: Diagnosis present

## 2022-10-23 DIAGNOSIS — K5900 Constipation, unspecified: Secondary | ICD-10-CM | POA: Diagnosis present

## 2022-10-23 DIAGNOSIS — E1169 Type 2 diabetes mellitus with other specified complication: Secondary | ICD-10-CM | POA: Diagnosis not present

## 2022-10-23 DIAGNOSIS — S32021S Stable burst fracture of second lumbar vertebra, sequela: Secondary | ICD-10-CM | POA: Diagnosis not present

## 2022-10-23 DIAGNOSIS — Z8249 Family history of ischemic heart disease and other diseases of the circulatory system: Secondary | ICD-10-CM | POA: Diagnosis not present

## 2022-10-23 DIAGNOSIS — S32020A Wedge compression fracture of second lumbar vertebra, initial encounter for closed fracture: Secondary | ICD-10-CM | POA: Diagnosis not present

## 2022-10-23 DIAGNOSIS — Y92410 Unspecified street and highway as the place of occurrence of the external cause: Secondary | ICD-10-CM

## 2022-10-23 DIAGNOSIS — Z880 Allergy status to penicillin: Secondary | ICD-10-CM | POA: Diagnosis not present

## 2022-10-23 DIAGNOSIS — F411 Generalized anxiety disorder: Secondary | ICD-10-CM | POA: Diagnosis not present

## 2022-10-23 DIAGNOSIS — Z79899 Other long term (current) drug therapy: Secondary | ICD-10-CM | POA: Diagnosis not present

## 2022-10-23 DIAGNOSIS — Z7984 Long term (current) use of oral hypoglycemic drugs: Secondary | ICD-10-CM

## 2022-10-23 DIAGNOSIS — M549 Dorsalgia, unspecified: Secondary | ICD-10-CM | POA: Diagnosis not present

## 2022-10-23 DIAGNOSIS — G4733 Obstructive sleep apnea (adult) (pediatric): Secondary | ICD-10-CM | POA: Diagnosis present

## 2022-10-23 DIAGNOSIS — Z8673 Personal history of transient ischemic attack (TIA), and cerebral infarction without residual deficits: Secondary | ICD-10-CM | POA: Diagnosis not present

## 2022-10-23 DIAGNOSIS — E119 Type 2 diabetes mellitus without complications: Secondary | ICD-10-CM | POA: Diagnosis present

## 2022-10-23 DIAGNOSIS — Z825 Family history of asthma and other chronic lower respiratory diseases: Secondary | ICD-10-CM | POA: Diagnosis not present

## 2022-10-23 DIAGNOSIS — I700 Atherosclerosis of aorta: Secondary | ICD-10-CM | POA: Diagnosis present

## 2022-10-23 DIAGNOSIS — K219 Gastro-esophageal reflux disease without esophagitis: Secondary | ICD-10-CM | POA: Diagnosis present

## 2022-10-23 DIAGNOSIS — F419 Anxiety disorder, unspecified: Secondary | ICD-10-CM | POA: Diagnosis present

## 2022-10-23 DIAGNOSIS — S32001S Stable burst fracture of unspecified lumbar vertebra, sequela: Secondary | ICD-10-CM | POA: Diagnosis not present

## 2022-10-23 DIAGNOSIS — I10 Essential (primary) hypertension: Secondary | ICD-10-CM | POA: Diagnosis present

## 2022-10-23 DIAGNOSIS — Z6835 Body mass index (BMI) 35.0-35.9, adult: Secondary | ICD-10-CM | POA: Diagnosis not present

## 2022-10-23 DIAGNOSIS — R5381 Other malaise: Secondary | ICD-10-CM | POA: Diagnosis present

## 2022-10-23 DIAGNOSIS — R52 Pain, unspecified: Secondary | ICD-10-CM | POA: Diagnosis not present

## 2022-10-23 DIAGNOSIS — Z7982 Long term (current) use of aspirin: Secondary | ICD-10-CM | POA: Diagnosis not present

## 2022-10-23 DIAGNOSIS — E785 Hyperlipidemia, unspecified: Secondary | ICD-10-CM | POA: Diagnosis present

## 2022-10-23 DIAGNOSIS — S32021D Stable burst fracture of second lumbar vertebra, subsequent encounter for fracture with routine healing: Principal | ICD-10-CM

## 2022-10-23 DIAGNOSIS — M48061 Spinal stenosis, lumbar region without neurogenic claudication: Secondary | ICD-10-CM | POA: Diagnosis present

## 2022-10-23 DIAGNOSIS — S32009A Unspecified fracture of unspecified lumbar vertebra, initial encounter for closed fracture: Secondary | ICD-10-CM | POA: Diagnosis not present

## 2022-10-23 DIAGNOSIS — S32001D Stable burst fracture of unspecified lumbar vertebra, subsequent encounter for fracture with routine healing: Secondary | ICD-10-CM | POA: Diagnosis not present

## 2022-10-23 DIAGNOSIS — E669 Obesity, unspecified: Secondary | ICD-10-CM | POA: Diagnosis not present

## 2022-10-23 LAB — GLUCOSE, CAPILLARY
Glucose-Capillary: 136 mg/dL — ABNORMAL HIGH (ref 70–99)
Glucose-Capillary: 123 mg/dL — ABNORMAL HIGH (ref 70–99)
Glucose-Capillary: 124 mg/dL — ABNORMAL HIGH (ref 70–99)

## 2022-10-23 MED ORDER — GUAIFENESIN-DM 100-10 MG/5ML PO SYRP: 5.0000 mL | ORAL_SOLUTION | Freq: Four times a day (QID) | ORAL | Status: DC | PRN

## 2022-10-23 MED ORDER — OXYCODONE HCL 5 MG PO TABS: 5.0000 mg | ORAL_TABLET | Freq: Four times a day (QID) | ORAL | Status: DC | PRN

## 2022-10-23 MED ORDER — METHOCARBAMOL 500 MG PO TABS: 500.0000 mg | ORAL_TABLET | Freq: Four times a day (QID) | ORAL | Status: DC | PRN

## 2022-10-23 MED ORDER — DOCUSATE SODIUM 100 MG PO CAPS
100.0000 mg | ORAL_CAPSULE | Freq: Once | ORAL | Status: DC
  Filled 2022-10-23: qty 1

## 2022-10-23 MED ORDER — ENOXAPARIN SODIUM 40 MG/0.4ML IJ SOSY
40.0000 mg | PREFILLED_SYRINGE | INTRAMUSCULAR | Status: DC
  Administered 2022-10-23 – 2022-11-03 (×12): 40 mg via SUBCUTANEOUS
  Filled 2022-10-23 (×12): qty 0.4

## 2022-10-23 MED ORDER — ALUM & MAG HYDROXIDE-SIMETH 200-200-20 MG/5ML PO SUSP: 30.0000 mL | ORAL | Status: DC | PRN

## 2022-10-23 MED ORDER — SENNOSIDES-DOCUSATE SODIUM 8.6-50 MG PO TABS: 2.0000 | ORAL_TABLET | Freq: Two times a day (BID) | ORAL | Status: DC

## 2022-10-23 MED ORDER — TRAZODONE HCL 50 MG PO TABS: 25.0000 mg | ORAL_TABLET | Freq: Every evening | ORAL | Status: DC | PRN

## 2022-10-23 MED ORDER — MAGNESIUM HYDROXIDE 400 MG/5ML PO SUSP: 30.0000 mL | Freq: Every day | ORAL | Status: DC | PRN

## 2022-10-23 MED ORDER — SEMAGLUTIDE (1 MG/DOSE) 4 MG/3ML ~~LOC~~ SOPN
1.0000 mg | PEN_INJECTOR | SUBCUTANEOUS | Status: DC
  Administered 2022-10-29: 1 mg via INTRAMUSCULAR

## 2022-10-23 MED ORDER — FLEET ENEMA 7-19 GM/118ML RE ENEM
1.0000 | ENEMA | Freq: Once | RECTAL | Status: AC | PRN
  Administered 2022-10-23: 1 via RECTAL
  Filled 2022-10-23: qty 1

## 2022-10-23 MED ORDER — PANTOPRAZOLE SODIUM 40 MG PO TBEC: 40.0000 mg | DELAYED_RELEASE_TABLET | Freq: Every day | ORAL | Status: DC

## 2022-10-23 MED ORDER — PROCHLORPERAZINE MALEATE 5 MG PO TABS: 5.0000 mg | ORAL_TABLET | Freq: Four times a day (QID) | ORAL | Status: DC | PRN

## 2022-10-23 MED ORDER — ATORVASTATIN CALCIUM 10 MG PO TABS
10.0000 mg | ORAL_TABLET | Freq: Every day | ORAL | Status: DC
  Administered 2022-10-23 – 2022-11-03 (×12): 10 mg via ORAL
  Filled 2022-10-23 (×12): qty 1

## 2022-10-23 MED ORDER — PROCHLORPERAZINE EDISYLATE 10 MG/2ML IJ SOLN: 5.0000 mg | Freq: Four times a day (QID) | INTRAMUSCULAR | Status: DC | PRN

## 2022-10-23 MED ORDER — POLYETHYLENE GLYCOL 3350 17 G PO PACK: 17.0000 g | PACK | Freq: Every day | ORAL | 0 refills | Status: DC

## 2022-10-23 MED ORDER — OXYCODONE HCL 5 MG PO TABS
5.0000 mg | ORAL_TABLET | Freq: Three times a day (TID) | ORAL | Status: DC | PRN
  Filled 2022-10-23: qty 1

## 2022-10-23 MED ORDER — POLYETHYLENE GLYCOL 3350 17 G PO PACK: 17.0000 g | PACK | Freq: Every day | ORAL | Status: DC

## 2022-10-23 MED ORDER — PROCHLORPERAZINE 25 MG RE SUPP: 12.5000 mg | Freq: Four times a day (QID) | RECTAL | Status: DC | PRN

## 2022-10-23 MED ORDER — SENNOSIDES-DOCUSATE SODIUM 8.6-50 MG PO TABS
2.0000 | ORAL_TABLET | Freq: Two times a day (BID) | ORAL | Status: DC
  Administered 2022-10-23 – 2022-11-04 (×22): 2 via ORAL
  Filled 2022-10-23 (×24): qty 2

## 2022-10-23 MED ORDER — SENNOSIDES-DOCUSATE SODIUM 8.6-50 MG PO TABS
2.0000 | ORAL_TABLET | Freq: Two times a day (BID) | ORAL | Status: DC
  Administered 2022-10-23: 2 via ORAL
  Filled 2022-10-23: qty 2

## 2022-10-23 MED ORDER — METFORMIN HCL ER 500 MG PO TB24
500.0000 mg | ORAL_TABLET | Freq: Every day | ORAL | Status: DC
  Administered 2022-10-24 – 2022-11-04 (×12): 500 mg via ORAL
  Filled 2022-10-23 (×12): qty 1

## 2022-10-23 MED ORDER — POLYETHYLENE GLYCOL 3350 17 G PO PACK
17.0000 g | PACK | Freq: Two times a day (BID) | ORAL | Status: DC
  Administered 2022-10-23 – 2022-11-03 (×17): 17 g via ORAL
  Filled 2022-10-23 (×21): qty 1

## 2022-10-23 MED ORDER — SORBITOL 70 % SOLN
30.0000 mL | Freq: Every day | Status: DC | PRN
  Administered 2022-10-24: 30 mL via ORAL
  Filled 2022-10-23: qty 30

## 2022-10-23 MED ORDER — LOSARTAN POTASSIUM 50 MG PO TABS
100.0000 mg | ORAL_TABLET | Freq: Every day | ORAL | Status: DC
  Administered 2022-10-23 – 2022-11-03 (×12): 100 mg via ORAL
  Filled 2022-10-23 (×12): qty 2

## 2022-10-23 MED ORDER — DIPHENHYDRAMINE HCL 12.5 MG/5ML PO ELIX: 12.5000 mg | ORAL_SOLUTION | Freq: Four times a day (QID) | ORAL | Status: DC | PRN

## 2022-10-23 MED ORDER — ACETAMINOPHEN 325 MG PO TABS: 325.0000 mg | ORAL_TABLET | ORAL | Status: DC | PRN

## 2022-10-23 MED ORDER — POLYETHYLENE GLYCOL 3350 17 G PO PACK
17.0000 g | PACK | Freq: Every day | ORAL | Status: DC
  Administered 2022-10-23: 17 g via ORAL
  Filled 2022-10-23: qty 1

## 2022-10-23 MED ORDER — ACETAMINOPHEN 500 MG PO TABS
1000.0000 mg | ORAL_TABLET | Freq: Three times a day (TID) | ORAL | Status: DC
  Administered 2022-10-23 – 2022-10-31 (×26): 1000 mg via ORAL
  Administered 2022-11-01: 500 mg via ORAL
  Administered 2022-11-01 – 2022-11-02 (×3): 1000 mg via ORAL
  Filled 2022-10-23 (×30): qty 2

## 2022-10-23 MED ORDER — PANTOPRAZOLE SODIUM 40 MG PO TBEC
40.0000 mg | DELAYED_RELEASE_TABLET | Freq: Every day | ORAL | Status: DC
  Administered 2022-10-24 – 2022-11-04 (×12): 40 mg via ORAL
  Filled 2022-10-23 (×12): qty 1

## 2022-10-23 MED ORDER — OXYCODONE HCL 5 MG PO TABS
5.0000 mg | ORAL_TABLET | Freq: Two times a day (BID) | ORAL | Status: DC
  Administered 2022-10-24 – 2022-11-02 (×19): 5 mg via ORAL
  Filled 2022-10-23 (×20): qty 1
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
## 2022-10-23 NOTE — Progress Notes (Signed)
Patient arrived and oriented to unit. Verbalized agreement to call for assistance with needs.   Expressed concern about 4 things: 1) no BM since 10/29 and on ozempic/oxycodone that slow down GI system 2) May want to continue taking supplements if acceptable during stay - prefers not to use sliding scale insulin since metformin and ozempic working at home, states will coordinate with our medical team here 3) pain greater when sitting up to eat and already has decreased appetite, concerned about getting enough nutrition for therapy sessions 4) pain management.  Resting comfortably in room with call bell in reach

## 2022-10-23 NOTE — Progress Notes (Signed)
[facility] rehab admissions - I have a bed on CIR today and I have medical clearance to admit to CIR today.  Call me for questions.  [phone]

## 2022-10-23 NOTE — Progress Notes (Signed)
Inpatient Rehabilitation Admission Medication Review by a Pharmacist  A complete drug regimen review was completed for this patient to identify any potential clinically significant medication issues.  High Risk Drug Classes Is patient taking? Indication by Medication  Antipsychotic Yes Compazine - prn N/V  Anticoagulant Yes Lovenox - VTE ppx  Antibiotic No   Opioid Yes Oxycodone - prn pain  Antiplatelet No   Hypoglycemics/insulin Yes Metformin, Semaglutide - DM  Vasoactive Medication Yes Losartan - BP  Chemotherapy No   Other Yes Atorvastatin - HLD Pantoprazole - Reflux Trazodone - prn sleep Methocarbamol - prn spasms     Type of Medication Issue Identified Description of Issue Recommendation(s)  Drug Interaction(s) (clinically significant)     Duplicate Therapy     Allergy     No Medication Administration End Date     Incorrect Dose     Additional Drug Therapy Needed     Significant med changes from prior encounter (inform family/care partners about these prior to discharge). Amlodipine 10 mg po daily Resume if and when appropriate   Other       Clinically significant medication issues were identified that warrant physician communication and completion of prescribed/recommended actions by midnight of the next day:  No  Pharmacist comments: None  Time spent performing this drug regimen review (minutes):  30 minutes   Tad Moore 10/23/2022 12:49 PM
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
## 2022-10-23 NOTE — TOC Transition Note (Signed)
Transition of Care [facility]) - CM/SW Discharge Note   Patient Details  Name: Danny [name] MRN: [id number] Date of Birth: September 21, 1951  Transition of Care [facility]) CM/SW Contact:  Vassie Moselle, [place] Phone Number: 10/23/2022, 11:36 AM   Clinical Narrative:    Pt has been accepted to [facility] and is able to transfer to their facility today. [facility] is to arrange transportation via [facility].    Final next level of care: IP Rehab Facility Barriers to Discharge: No Barriers Identified   Patient Goals and CMS Choice Patient states their goals for this hospitalization and ongoing recovery are:: To transfer to [facility] CMS Medicare.gov Compare Post Acute Care list provided to:: Patient Choice offered to / list presented to : Patient  Discharge Placement                Patient to be transferred to facility by: [place]      Discharge Plan and Services                DME Arranged: N/A DME Agency: NA                  Social Determinants of Health (SDOH) Interventions     Readmission Risk Interventions    10/23/2022   11:36 AM  Readmission Risk Prevention Plan  Post Dischage Appt Complete  Medication Screening Complete  Transportation Screening Complete

## 2022-10-23 NOTE — Progress Notes (Signed)
Spoke with Ramiro Harvest, RN on [place] to give report. All questions answered. D/C info in packet. Awaiting Research scientist (medical). Pt will transfer to [facility] 4W23.
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
## 2022-10-23 NOTE — Progress Notes (Signed)
Patient has home CPAP for use, currently patient is vomiting and is unable to wear his CPAP at this time.
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
## 2022-10-24 DIAGNOSIS — S32021S Stable burst fracture of second lumbar vertebra, sequela: Secondary | ICD-10-CM

## 2022-10-24 LAB — CBC WITH DIFFERENTIAL/PLATELET
Abs Immature Granulocytes: 0.05 10*3/uL (ref 0.00–0.07)
Basophils Absolute: 0 10*3/uL (ref 0.0–0.1)
Basophils Relative: 0 %
Eosinophils Absolute: 0.1 10*3/uL (ref 0.0–0.5)
Eosinophils Relative: 1 %
HCT: 43 % (ref 39.0–52.0)
Hemoglobin: 14.5 g/dL (ref 13.0–17.0)
Immature Granulocytes: 1 %
Lymphocytes Relative: 9 %
Lymphs Abs: 0.7 10*3/uL (ref 0.7–4.0)
MCH: 29.1 pg (ref 26.0–34.0)
MCHC: 33.7 g/dL (ref 30.0–36.0)
MCV: 86.3 fL (ref 80.0–100.0)
Monocytes Absolute: 0.8 10*3/uL (ref 0.1–1.0)
Monocytes Relative: 10 %
Neutro Abs: 6.7 10*3/uL (ref 1.7–7.7)
Neutrophils Relative %: 79 %
Platelets: 193 10*3/uL (ref 150–400)
RBC: 4.98 MIL/uL (ref 4.22–5.81)
RDW: 12.6 % (ref 11.5–15.5)
WBC: 8.5 10*3/uL (ref 4.0–10.5)
nRBC: 0 % (ref 0.0–0.2)

## 2022-10-24 LAB — COMPREHENSIVE METABOLIC PANEL
ALT: 25 U/L (ref 0–44)
AST: 22 U/L (ref 15–41)
Albumin: 3.5 g/dL (ref 3.5–5.0)
Alkaline Phosphatase: 44 U/L (ref 38–126)
Anion gap: 11 (ref 5–15)
BUN: 14 mg/dL (ref 8–23)
CO2: 23 mmol/L (ref 22–32)
Calcium: 8.8 mg/dL — ABNORMAL LOW (ref 8.9–10.3)
Chloride: 101 mmol/L (ref 98–111)
Creatinine, Ser: 0.79 mg/dL (ref 0.61–1.24)
GFR, Estimated: >60 mL/min (ref >60)
Glucose, Bld: 146 mg/dL — ABNORMAL HIGH (ref 70–99)
Potassium: 3.6 mmol/L (ref 3.5–5.1)
Sodium: 135 mmol/L (ref 135–145)
Total Bilirubin: 1.5 mg/dL — ABNORMAL HIGH (ref 0.3–1.2)
Total Protein: 6.5 g/dL (ref 6.5–8.1)

## 2022-10-24 LAB — PHOSPHORUS: Phosphorus: 4.5 mg/dL (ref 2.5–4.6)

## 2022-10-24 LAB — GLUCOSE, CAPILLARY
Glucose-Capillary: 128 mg/dL — ABNORMAL HIGH (ref 70–99)
Glucose-Capillary: 114 mg/dL — ABNORMAL HIGH (ref 70–99)

## 2022-10-24 LAB — MAGNESIUM: Magnesium: 2.3 mg/dL (ref 1.7–2.4)

## 2022-10-24 MED ORDER — MAGNESIUM CITRATE PO SOLN: 1.0000 | Freq: Every day | ORAL | Status: DC | PRN
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
## 2022-10-24 NOTE — Plan of Care (Signed)
Problem: RH Balance Goal: LTG: Patient will maintain dynamic sitting balance (OT) Description: LTG:  Patient will maintain dynamic sitting balance with assistance during activities of daily living (OT) Flowsheets (Taken 10/24/2022 1019) LTG: Pt will maintain dynamic sitting balance during ADLs with: Independent Goal: LTG Patient will maintain dynamic standing with ADLs (OT) Description: LTG:  Patient will maintain dynamic standing balance with assist during activities of daily living (OT)  Flowsheets (Taken 10/24/2022 1019) LTG: Pt will maintain dynamic standing balance during ADLs with: Independent with assistive device   Problem: Sit to Stand Goal: LTG:  Patient will perform sit to stand in prep for activites of daily living with assistance level (OT) Description: LTG:  Patient will perform sit to stand in prep for activites of daily living with assistance level (OT) Flowsheets (Taken 10/24/2022 1019) LTG: PT will perform sit to stand in prep for activites of daily living with assistance level: Independent with assistive device   Problem: RH Grooming Goal: LTG Patient will perform grooming w/assist,cues/equip (OT) Description: LTG: Patient will perform grooming with assist, with/without cues using equipment (OT) Flowsheets (Taken 10/24/2022 1019) LTG: Pt will perform grooming with assistance level of: Independent   Problem: RH Bathing Goal: LTG Patient will bathe all body parts with assist levels (OT) Description: LTG: Patient will bathe all body parts with assist levels (OT) Flowsheets (Taken 10/24/2022 1019) LTG: Pt will perform bathing with assistance level/cueing: Independent with assistive device    Problem: RH Dressing Goal: LTG Patient will perform upper body dressing (OT) Description: LTG Patient will perform upper body dressing with assist, with/without cues (OT). Flowsheets (Taken 10/24/2022 1019) LTG: Pt will perform upper body dressing with assistance level of:  Independent Goal: LTG Patient will perform lower body dressing w/assist (OT) Description: LTG: Patient will perform lower body dressing with assist, with/without cues in positioning using equipment (OT) Flowsheets (Taken 10/24/2022 1019) LTG: Pt will perform lower body dressing with assistance level of: Independent with assistive device   Problem: RH Toileting Goal: LTG Patient will perform toileting task (3/3 steps) with assistance level (OT) Description: LTG: Patient will perform toileting task (3/3 steps) with assistance level (OT)  Flowsheets (Taken 10/24/2022 1019) LTG: Pt will perform toileting task (3/3 steps) with assistance level: Independent with assistive device   Problem: RH Simple Meal Prep Goal: LTG Patient will perform simple meal prep w/assist (OT) Description: LTG: Patient will perform simple meal prep with assistance, with/without cues (OT). Flowsheets (Taken 10/24/2022 1019) LTG: Pt will perform simple meal prep with assistance level of: Supervision/Verbal cueing   Problem: RH Laundry Goal: LTG Patient will perform laundry w/assist, cues (OT) Description: LTG: Patient will perform laundry with assistance, with/without cues (OT). Flowsheets (Taken 10/24/2022 1019) LTG: Pt will perform laundry with assistance level of: Supervision/Verbal cueing   Problem: RH Light Housekeeping Goal: LTG Patient will perform light housekeeping w/assist (OT) Description: LTG: Patient will perform light housekeeping with assistance, with/without cues (OT). Flowsheets (Taken 10/24/2022 1019) LTG: Pt will perform light housekeeping with assistance level of: Supervision/Verbal cueing   Problem: RH Toilet Transfers Goal: LTG Patient will perform toilet transfers w/assist (OT) Description: LTG: Patient will perform toilet transfers with assist, with/without cues using equipment (OT) Flowsheets (Taken 10/24/2022 1019) LTG: Pt will perform toilet transfers with assistance level of:  Supervision/Verbal cueing   Problem: RH Tub/Shower Transfers Goal: LTG Patient will perform tub/shower transfers w/assist (OT) Description: LTG: Patient will perform tub/shower transfers with assist, with/without cues using equipment (OT) Flowsheets (Taken 10/24/2022 1019) LTG: Pt will  perform tub/shower stall transfers with assistance level of: Supervision/Verbal cueing

## 2022-10-24 NOTE — Progress Notes (Signed)
PROGRESS NOTE   Subjective/Complaints: Pain is currently well controlled- it tends to be worst in seated position. Wearing brace, working with PT Discussed today's labs  ROS: +constipation  Objective:   No results found. Recent Labs    10/24/22 0522  WBC 8.5  HGB 14.5  HCT 43.0  PLT 193   Recent Labs    10/24/22 0522  NA 135  K 3.6  CL 101  CO2 23  GLUCOSE 146*  BUN 14  CREATININE 0.79  CALCIUM 8.8*    Intake/Output Summary (Last 24 hours) at 10/24/2022 1203 Last data filed at 10/24/2022 0839 Gross per 24 hour  Intake 120 ml  Output 1100 ml  Net -980 ml        Physical Exam: Vital Signs Blood pressure (!) 129/92, pulse 96, temperature 97.8 F (36.6 C), temperature source Oral, resp. rate 18, height 6\' 3"  (1.905 m), weight 124.2 kg, SpO2 95 %. Gen: no distress, normal appearing HEENT: oral mucosa pink and moist, NCAT Cardio: Reg rate Chest: normal effort, normal rate of breathing Abd: soft, non-distended GU: Not examined. +condom cath, draining dark clear urine Skin: C/D/I. No apparent lesions. Minimal abdominal bruise from seat belt, healing MSK:      + TTP along lower lumbar paraspinals and L>R flank. +TLSO at bedside.       Strength:                RUE: 5/5 SA, 5/5 EF, 5/5 EE, 5/5 WE, 5/5 FF, 5/5 FA                 LUE: 5/5 SA, 5/5 EF, 5/5 EE, 5/5 WE, 5/5 FF, 5/5 FA                 RLE: 5/5 HF, 5/5 KE, 5/5 DF, 5/5 EHL, 5/5 PF                 LLE:  5/5 HF, 5/5 KE, 5/5 DF, 5/5 EHL, 5/5 PF    Neurologic exam:  Cognition: AAO to person, place, time and event.  Language: Fluent, No substitutions or neoglisms. No dysarthria. Names 3/3 objects correctly.  Memory: Recalls 3/3 objects at 5 minutes. No apparent deficits  Insight: Good insight into current condition.  Mood: Pleasant affect, appropriate mood.  Sensation: To light touch intact in BL UEs and LEs  Reflexes: 2+ in BL UE and LEs. Negative  Hoffman's and babinski signs bilaterally.  CN: 2-12 grossly intact.  Coordination: No apparent tremors. No ataxia on FTN, HTS bilaterally.  Spasticity: MAS 0 in all extremities.    Assessment/Plan: 1. Functional deficits which require 3+ hours per day of interdisciplinary therapy in a comprehensive inpatient rehab setting. Physiatrist is providing close team supervision and 24 hour management of active medical problems listed below. Physiatrist and rehab team continue to assess barriers to discharge/monitor patient progress toward functional and medical goals  Care Tool:  Bathing              Bathing assist       Upper Body Dressing/Undressing Upper body dressing        Upper body assist  Lower Body Dressing/Undressing Lower body dressing            Lower body assist       Toileting Toileting    Toileting assist       Transfers Chair/bed transfer  Transfers assist     Chair/bed transfer assist level: Minimal Assistance - Patient > 75%     Locomotion Ambulation   Ambulation assist      Assist level: Contact Guard/Touching assist Assistive device: Walker-rolling Max distance: 150'   Walk 10 feet activity   Assist     Assist level: Contact Guard/Touching assist Assistive device: Walker-rolling   Walk 50 feet activity   Assist    Assist level: Contact Guard/Touching assist Assistive device: Walker-rolling    Walk 150 feet activity   Assist    Assist level: Contact Guard/Touching assist Assistive device: Walker-rolling    Walk 10 feet on uneven surface  activity   Assist     Assist level: Contact Guard/Touching assist Assistive device: Walker-rolling   Wheelchair     Assist Is the patient using a wheelchair?: Yes Type of Wheelchair: Manual    Wheelchair assist level: Dependent - Patient 0% Max wheelchair distance: 150'    Wheelchair 50 feet with 2 turns activity    Assist        Assist Level:  Dependent - Patient 0%   Wheelchair 150 feet activity     Assist      Assist Level: Dependent - Patient 0%   Blood pressure (!) 129/92, pulse 96, temperature 97.8 F (36.6 C), temperature source Oral, resp. rate 18, height 6\' 3"  (1.905 m), weight 124.2 kg, SpO2 95 %.    Medical Problem List and Plan: 1. Functional deficits secondary to transverse process fractures of L1-L2, burst fracture of L2             -patient may shower with removal of TLSO             -ELOS/Goals: 7-10 days, Mod I level  Initial CIR evals today 2.  Antithrombotics: -DVT/anticoagulation:  Pharmaceutical: Lovenox (start today 40 mg daily)             -antiplatelet therapy: none; holding home aspirin 325 mg daily?   3. Pain Management: Tylenol, Robaxin, oxycodone, prn             - Changed regimen to scheduled Tylenol 1000 mg TID + Oxy IR 5 mg BID 1 hr prior to therapies to assist in pain control with weightbearing and mobilization            - Oxy 5 mg PRN TID for breakthrough pain   4. Mood/Behavior/Sleep: LCSW to evaluate and provide emotional support             -history of anxiety             -antipsychotic agents: none 5. Neuropsych/cognition: This patient is capable of making decisions on his own behalf. 6. Skin/Wound Care: Routine skin care checks 7. Fluids/Electrolytes/Nutrition: Routine Is and Os and follow-up chemistries             -carb modified diet 8:Transverse process fractures of L1-L2, burst fracture of L2: maintain TSLO             -follow-up with Dr. Zada Finders             - TLSO when OOB, can remove for shower   9: Moderate spinal canal stenosis L4-L5 - no neuropathic pain  10: DM2: A1c = 5.8; discontinue CBGs; patient refuses SSI             -continue metformin 500 mg q AM              - Resume home Ozempic, 1st dose Wed 11/8          -recommended avoiding added sugars in diet 11: OSA: CPAP q HS; has home machine 12: GERD: continue Protonix. PRNs for nausea available, likely  d/t constipation.  13: Hypertension: monitor TID and prn; home Norvasc 10 mg not restarted             -continue Cozaar 100 mg q HS 14: Hyperlipidemia: continue Lipitor 15: Morbid obesity: BMI = 35-->34.22; dietary counseling. Continue Ozempic 16. Constipation - last BM 11/2 but not complete             - scheduled MiraLax BID and Senekot-S 2 tabs BID             - Sorbitol PRN  -mag citrate ordered daily prn for severe constipation    LOS: 1 days A FACE TO FACE EVALUATION WAS PERFORMED  Vernelle Wisner P Price [place] 10/24/2022, 12:03 PM

## 2022-10-24 NOTE — Progress Notes (Signed)
Inpatient Rehabilitation  Patient information reviewed and entered into eRehab system by Mariella Blackwelder Tynisha Ogan, OTR/L, Rehab Quality Coordinator.   Information including medical coding, functional ability and quality indicators will be reviewed and updated through discharge.   

## 2022-10-24 NOTE — Progress Notes (Signed)
Inpatient Rehabilitation Care Coordinator Assessment and Plan Patient Details  Name: Danny [name] MRN: [id number] Date of Birth: [birth year]  Today's Date: 10/24/2022  Hospital Problems: Principal Problem:   Burst fracture of lumbar vertebra [facility])  Past Medical History:  Past Medical History:  Diagnosis Date   Allergic rhinitis, cause unspecified 03/12/2012   Allergy    SEASONAL   Anxiety    Diabetes ([place]) 06/26/2008   Centricity Description: DM Qualifier: Diagnosis of  By: Jenny Reichmann MD, Hunt Oris  Centricity Description: DIABETES MELLITUS, TYPE II Qualifier: Diagnosis of  By: Jenny Reichmann MD, [place], COLON 7/84/6962   DM w/o Complication Type II 08/26/2840   HYPERLIPIDEMIA 06/27/2008   HYPERTENSION 06/27/2008   Kidney stones    Mini stroke    OBSTRUCTIVE SLEEP APNEA 06/26/2008   Sleep apnea    CPAP   Stroke [facility])    MINI   Past Surgical History:  Past Surgical History:  Procedure Laterality Date   COLONOSCOPY     COSMETIC SURGERY     NOSE SURGERY  1970   cosmetic   TONSILLECTOMY     Social History:  reports that he has never smoked. He has never used smokeless tobacco. He reports current alcohol use. He reports that he does not use drugs.  Family / Support Systems Marital Status: Married How Long?: 22 years Patient Roles: Spouse, Parent Spouse/Significant Other: Danny [name] (wife) Children: Two daughters. One dtr lives within 77 mi and  the other dtr lives in [place]. Other Supports: None reported Anticipated Caregiver: wife and dtr Ability/Limitations of Caregiver: Wife works in Press photographer and travels for work 2-3 xs per week. Pt states his dtr is able to help when his wife is not working. He also reports she watches their grandchildren when she is not working as well. Caregiver Availability: 24/7 Family Dynamics: Pt lives with his wife.  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 Education: college Field seismologist - How often do you need to have someone help you when you read instructions, pamphlets, or other written material from your doctor or pharmacy?: Never Writes: Yes Employment Status: Retired Public relations account executive Issues: Denies Guardian/Conservator: N/A   Abuse/Neglect Abuse/Neglect Assessment Can Be Completed: Yes Physical Abuse: Denies Verbal Abuse: Denies Sexual Abuse: Denies Exploitation of patient/patient's resources: Denies Self-Neglect: Denies  Patient response to: Social Isolation - How often do you feel lonely or isolated from those around you?: Never  Emotional Status Pt's affect, behavior and adjustment status: Pt in good spirits at time of visit. Recent Psychosocial Issues: Denies Psychiatric History: Pt reports a hx of anxiety at time of his initial retirement; in which he was prescribed medication by PCP and he took one time and did not think it was needed. States the anxiety slowly tapered off. Substance Abuse History: Pt reports to rare/occassion of glass of wine or beer at dinner but states not often.  Patient / Family Perceptions, Expectations & Goals Pt/Family understanding of illness & functional limitations: Pt has a genral understanding of care needs Premorbid pt/family roles/activities: Independent Anticipated changes in roles/activities/participation: Assistance with ADLs/IADLs Pt/family expectations/goals: pt goal is to be able to move around better since he has the lower back issue, and would like to be able and get dressed; be able to complete things we normally take for granted.  Community [employer] Agencies: None Premorbid Home Care/DME  Agencies: None Transportation available at discharge: TBD Is the patient able to respond to transportation needs?: Yes In the past 12 months, has lack of transportation kept you from medical appointments or from  getting medications?: No In the past 12 months, has lack of transportation kept you from meetings, work, or from getting things needed for daily living?: No Resource referrals recommended: Neuropsychology  Discharge Planning Living Arrangements: Spouse/significant other Support Systems: Spouse/significant other, Children, Friends/neighbors Type of Residence: Private residence Insurance Resources: [employer], Multimedia programmer (specify) Nurse, mental health) Financial Resources: Fish farm manager, Other (Comment) (pension/savings) Financial Screen Referred: No Living Expenses: Medical laboratory scientific officer Management: Patient, Spouse Does the patient have any problems obtaining your medications?: No Home Management: Pt reports he and his wife manage home care needs. States he typically would complete the yardwork. Patient/Family Preliminary Plans: TBD Care Coordinator Barriers to Discharge: Decreased caregiver support, Lack of/limited family support Care Coordinator Anticipated Follow Up Needs: HH/OP  Clinical Impression This SW covering for primary SW, Erlene Quan.  SW met with pt in room to introduce self, explain role, and discuss discharge process. Pt is not a English as a second language teacher. No HCPOA. No DME. Pt aware that primary SW will follow-up with him and his wife regarding continued updates. His primary concern is to be here as long as possible as he states his wife is significantly smaller than he is and she is not able to lift him. SW shared his concerns will be brought to team meeting.    Danny [name] 10/24/2022, 2:53 PM

## 2022-10-24 NOTE — IPOC Note (Signed)
Overall Plan of Care [facility]) Patient Details Name: PAT SIRES MRN: [id number] DOB: 11-08-1951  Admitting Diagnosis: Burst fracture of lumbar vertebra [facility])  Hospital Problems: Principal Problem:   Burst fracture of lumbar vertebra (HCC)     Functional Problem List: Nursing Bowel, Pain, Safety, Endurance, Medication Management  PT Balance, Endurance, Motor, Pain, Safety  OT Balance, Endurance, Pain  SLP    TR         Basic ADL's: OT Grooming, Bathing, Dressing, Toileting     Advanced  ADL's: OT Simple Meal Preparation, Light Housekeeping     Transfers: PT Bed Mobility, Bed to Chair, Car, Occupational psychologist, Research scientist (life sciences): PT Stairs, Ambulation     Additional Impairments: OT    SLP        TR      Anticipated Outcomes Item Anticipated Outcome  Self Feeding indep  Swallowing      Basic self-care  mod I  Toileting  mod I   Bathroom Transfers S/mod I  Bowel/Bladder  manage bowel w mod I assist  Transfers  Supervision  Locomotion  Supervision  Communication     Cognition     Pain  < 4 with prns  Safety/Judgment  manage w cues   Therapy Plan: PT Intensity: Minimum of 1-2 x/day ,45 to 90 minutes PT Frequency: 5 out of 7 days PT Duration Estimated Length of Stay: 5-7 Days OT Intensity: Minimum of 1-2 x/day, 45 to 90 minutes OT Frequency: 5 out of 7 days OT Duration/Estimated Length of Stay: 5-7 days     Team Interventions: Nursing Interventions Bowel Management, Medication Management, Pain Management, Disease Management/Prevention, Discharge Planning  PT interventions Ambulation/gait training, Community reintegration, DME/adaptive equipment instruction, Neuromuscular re-education, Psychosocial support, Stair training, UE/LE Strength taining/ROM, Warden/ranger, Discharge planning, Functional electrical stimulation, Pain management, Skin care/wound management, Therapeutic Activities, UE/LE Coordination activities,  Cognitive remediation/compensation, Disease management/prevention, Functional mobility training, Patient/family education, Splinting/orthotics, Therapeutic Exercise  OT Interventions Warden/ranger, Community reintegration, Discharge planning, Disease mangement/prevention, DME/adaptive equipment instruction, Functional mobility training, Neuromuscular re-education, Pain management, Patient/family education, Psychosocial support, Self Care/advanced ADL retraining, Skin care/wound managment, Therapeutic Activities, Therapeutic Exercise, UE/LE Strength taining/ROM, UE/LE Coordination activities, Wheelchair propulsion/positioning  SLP Interventions    TR Interventions    SW/CM Interventions Discharge Planning, Psychosocial Support, Patient/Family Education   Barriers to Discharge MD  Medical stability  Nursing Decreased caregiver support, Home environment access/layout 2 level main B+B, 5ste/5ste via garage right rail w spouse (works), dtr nearby can assist prn  PT      OT Inaccessible home environment, Decreased caregiver support, Home environment access/layout, Incontinence, Other (comments) pain, steps to enter home, full baths up FF of stairs, wife works 3 days per week,  SLP      SW Decreased caregiver support, Lack of/limited family support     Team Discharge Planning: Destination: PT-Home ,OT-   , SLP-  Projected Follow-up: PT-Outpatient PT, OT-  Home health OT, Outpatient OT (TBD), SLP-  Projected Equipment Needs: PT-To be determined, OT- 3 in 1 bedside comode, Tub/shower seat, To be determined, SLP-  Equipment Details: PT- , OT-bath seat (TTB vs shower seat based on pref for d/c) Patient/family involved in discharge planning: PT- Patient,  OT-Patient, SLP-   MD ELOS: 7-10 days Medical Rehab Prognosis:  Excellent Assessment: The patient has been admitted for CIR therapies with the diagnosis of L2 burst fracture. The team will be addressing functional mobility, strength,  stamina, balance, safety, adaptive  techniques and equipment, self-care, bowel and bladder mgt, patient and caregiver education. Goals have been set at [facility]. Anticipated discharge destination is home.        See Team Conference Notes for weekly updates to the plan of care

## 2022-10-24 NOTE — Progress Notes (Signed)
Slept fairly well. Encouraged fluid. Voids voluntarily. Sorbitol given this night shift with good effect. Patient endorsed he feels much better after that extra large BM. Safety maintained.

## 2022-10-24 NOTE — Progress Notes (Signed)
Occupational Therapy Session Note  Patient Details  Name: Danny [name] MRN: [id number] Date of Birth: 05/23/1951  Today's Date: 10/24/2022 OT Individual Time: 0932-3557 OT Individual Time Calculation (min): 35 min    Short Term Goals: Week 1:  OT Short Term Goal 1 (Week 1): STGs+LTGs 2/2 LOS  Pt received supine with no c/o pain at rest but reporting it increases to a 7/8 out of 10 when he begins moving, agreeable to OT session. He requested to don TLSO supine. Brace with ill fitting L section with large piece of fabric coming out of posterior section and two tightening straps not working correctly. He came to EOB with min A. Brace was tightened. Supervisor consulted re fit and after further investigation consulted ortho fit to fix brace or bring new one. Pt still reporting that brace was giving him a snug fit and he felt supported. He was able to complete 150 ft of functional mobility with the RW with CGA overall. Seated rest break provided before he returned to the room, another 150 ft with CGA using the RW. Functional mobility performed to increase household distance mobility and improve functional activity tolerance. Pt completed a stand pivot transfer back to bed with CGA. Pt required min A to bring his BLE into bed. He was left supine with all needs met.     Therapy Documentation Precautions:  Precautions Precautions: Fall, Back Required Braces or Orthoses: Spinal Brace Spinal Brace: Thoracolumbosacral orthotic, Applied in sitting position Restrictions Weight Bearing Restrictions: No    Therapy/Group: Individual Therapy  Curtis Sites 10/24/2022, 2:50 PM
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
## 2022-10-24 NOTE — Progress Notes (Signed)
Occupational Therapy Session Note  Patient Details  Name: Danny [name] MRN: [id number] Date of Birth: 01-06-1951  Today's Date: 10/24/2022 OT Individual Time: 1500-1535 OT Individual Time Calculation (min): 35 min    Short Term Goals: Week 1:  OT Short Term Goal 1 (Week 1): STGs+LTGs 2/2 LOS  Skilled Therapeutic Interventions/Progress Updates:  Pt seen for 3rd OT session this pm. Prev OT reported TLSO was not operational and notified ortho for repair/alternative brace. Pt was able to tolerate upright bed level longer with lower pain reported 6/10 in low back. Able to use urinal to void with set up only. Pt requested to shave and was provided all supplies with HOB up close to 60 degrees with no increased pain reported. Shaved with table top mirror with set up. OT discussed need for use of reacher (pt reports he has multiple in home) for LB garment mngt and safety in home to avoid bending for objects as well as potential to obtain a recliner for the 1st floor as he reported he does not have adequate seating to support back pain management and recovery. Pt open to all suggestions. Left pt bed level with needs, call button, urinal and bed exit alarm engaged.   Therapy Documentation Precautions:  Precautions Precautions: Fall, Back Required Braces or Orthoses: Spinal Brace Spinal Brace: Thoracolumbosacral orthotic, Applied in sitting position Restrictions Weight Bearing Restrictions: No    Therapy/Group: Individual Therapy  Barnabas Lister 10/24/2022, 12:58 PM

## 2022-10-25 DIAGNOSIS — E669 Obesity, unspecified: Secondary | ICD-10-CM

## 2022-10-25 DIAGNOSIS — F411 Generalized anxiety disorder: Secondary | ICD-10-CM

## 2022-10-25 DIAGNOSIS — R52 Pain, unspecified: Secondary | ICD-10-CM

## 2022-10-25 DIAGNOSIS — E1169 Type 2 diabetes mellitus with other specified complication: Secondary | ICD-10-CM

## 2022-10-25 LAB — GLUCOSE, CAPILLARY
Glucose-Capillary: 136 mg/dL — ABNORMAL HIGH (ref 70–99)
Glucose-Capillary: 126 mg/dL — ABNORMAL HIGH (ref 70–99)
Glucose-Capillary: 104 mg/dL — ABNORMAL HIGH (ref 70–99)

## 2022-10-25 MED ORDER — MANAGING BACK PAIN BOOK
Freq: Once | Status: AC
  Filled 2022-10-25: qty 1

## 2022-10-25 MED ORDER — EXERCISE FOR HEART AND HEALTH BOOK
Freq: Once | Status: AC
  Filled 2022-10-25: qty 1

## 2022-10-25 MED ORDER — METHOCARBAMOL 500 MG PO TABS
500.0000 mg | ORAL_TABLET | Freq: Four times a day (QID) | ORAL | Status: DC
  Administered 2022-10-25 – 2022-11-04 (×39): 500 mg via ORAL
  Filled 2022-10-25 (×39): qty 1

## 2022-10-25 NOTE — Progress Notes (Signed)
Occupational Therapy Session Note  Patient Details  Name: Danny [name] MRN: [id number] Date of Birth: May 12, 1951  Today's Date: 10/25/2022 OT Individual Time: 1000-1115 OT Individual Time Calculation (min): 75 min    Short Term Goals: Week 1:  OT Short Term Goal 1 (Week 1): STGs+LTGs 2/2 LOS  Skilled Therapeutic Interventions/Progress Updates:    Upon OT arrival, pt semi recumbent in bed and TLSO donned. Pt agreeable to OT treatment and reports no pain without movement. Treatment intervention with a focus on self care retraining, sitting tolerance, functional mobility, strengthening, and dynamic standing balance. Pt completes supine to sit transfer with SBA demonstrating good carryover of log roll technique. Pt sits EOB to donn pants with use of reacher and Min A for balance with pulling up over hips standing with RW. Pt ambulates to sink with CGA and stands with Min A-CGA to complete oral care. Pt completes stand to sit transfer with CGA and combs hair with setup. Pt was transported down to dayroom gym via w/c and total A and sits at tabletop with 1.5lb wrist weights donned. Pt was provided 2 graphic designs of colored plastic pegs to replicate. Pt able to maintain conversation with this therapist and replicate both designs without errors or rest breaks. Pt reports pain in back and requests to stand up. Pt completes sit to stand transfer with Min A and ambulates around gym to retrieve bean bags from the floor using reacher with RW and Min A. Pt able to retrieve 6 before requiring seated rest break with CGA. Pt completes a second sit to stand transfer with CGA and retrieves remaining bean bags with Min A before returning to seated position. Pt was transported to him room via w/c and total A and completes stand step transfer with RW and CGA to bed. Pt completes sit to supine transfer with CGA and doffs pants dependently per pt request. Pt was left in bed at end of session with all needs met and safety  measures in place.   Therapy Documentation Precautions:  Precautions Precautions: Fall, Back Required Braces or Orthoses: Spinal Brace Spinal Brace: Thoracolumbosacral orthotic, Applied in sitting position Restrictions Weight Bearing Restrictions: No   Therapy/Group: Individual Therapy  Marvetta Gibbons 10/25/2022, 12:15 PM
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
## 2022-10-25 NOTE — Progress Notes (Signed)
Physical Therapy Session Note  Patient Details  Name: Danny [name] MRN: [id number] Date of Birth: 1951-11-25  Today's Date: 10/25/2022 PT Individual Time: 0900-1000 and 1345-1445 PT Individual Time Calculation (min): 60 min and 60 min  Short Term Goals: Week 1:  PT Short Term Goal 1 (Week 1): STGs = LTGs  Skilled Therapeutic Interventions/Progress Updates:   First session:  Pt presents supine in bed and agreeable to therapy, although TLSO requires repair and unable to use at this time.  PT spoke w/ nurse and ortho already in and [employer] called to repair.  Pt agreeable to LE there ex to increase strength/flexibility and endurance.  Pt performed AP, HS, abd/add, QS 3 x 15.  Encouraged pt to perform throughout day, and for breathing technique.  CPO arrived for repair of TLSO.  Pt rolled side to side for donning in supine for improved pain.  Pt requires verbal cues for log roll technique and CGA to increase roll.  Educated pt on donning/doffing and use of TLSO.  Handed off pt care to OT.  Second session:  Pt presents supine in bed and agreeable to therapy.  Pt states pain of 7/10 w/ activity.  Pt rolled side to side w/ cues and  Total A to don TLSO.  Pt transfers R sidelying to sit w/ min A and use of siderail.  PT then attaches shoulder straps and tightened velcro straps.  Pt transfers sit to stand w/ min A and slightly elevated bed height.  Pt amb 155' to dayroom w/ CGA.  Pt performed standing horseshoe toss  reaching w/ L hand to left and throwing w/ right and then R hand to right and throwing w/ left.  Pt requires seated rest breaks between trials 2/2 increased pain in LB w/ static standing.  Pt performed cone obstacle course negotiation w/ CGA and good management of RW.  Pt amb 180' w/ RW and then 155' back to room w/ CGA.  Pt required min A for reverse log roll onto R side.  Pt required A to doff TLSO.  Pt then bridges to center of bed .  Bed alarm on and all needs in reach.  Pt states most pain  w/ standing vs amb .      Therapy Documentation Precautions:  Precautions Precautions: Fall, Back Required Braces or Orthoses: Spinal Brace Spinal Brace: Thoracolumbosacral orthotic, Applied in sitting position Restrictions Weight Bearing Restrictions: No General:   Vital Signs:  Pain: Pain Assessment Pain Scale: 0-10 Pain Score: 5  Pain Type: Acute pain Pain Location: Back Pain Orientation: Lower Pain Descriptors / Indicators: Aching;Pressure Pain Frequency: Constant Pain Onset: On-going Pain Intervention(s): Medication (See eMAR)    Therapy/Group: Individual Therapy  Ladoris Gene 10/25/2022, 10:07 AM

## 2022-10-25 NOTE — Progress Notes (Signed)
Pt has home cpap places self on

## 2022-10-26 NOTE — Progress Notes (Signed)
Pt has home CPAP unit. ?

## 2022-10-27 LAB — BASIC METABOLIC PANEL
Anion gap: 9 (ref 5–15)
BUN: 11 mg/dL (ref 8–23)
CO2: 24 mmol/L (ref 22–32)
Calcium: 8.7 mg/dL — ABNORMAL LOW (ref 8.9–10.3)
Chloride: 102 mmol/L (ref 98–111)
Creatinine, Ser: 0.8 mg/dL (ref 0.61–1.24)
GFR, Estimated: >60 mL/min (ref >60)
Glucose, Bld: 116 mg/dL — ABNORMAL HIGH (ref 70–99)
Potassium: 3.9 mmol/L (ref 3.5–5.1)
Sodium: 135 mmol/L (ref 135–145)

## 2022-10-27 LAB — CBC
HCT: 40.8 % (ref 39.0–52.0)
Hemoglobin: 13.4 g/dL (ref 13.0–17.0)
MCH: 29.2 pg (ref 26.0–34.0)
MCHC: 32.8 g/dL (ref 30.0–36.0)
MCV: 88.9 fL (ref 80.0–100.0)
Platelets: 195 10*3/uL (ref 150–400)
RBC: 4.59 MIL/uL (ref 4.22–5.81)
RDW: 12.6 % (ref 11.5–15.5)
WBC: 5.8 10*3/uL (ref 4.0–10.5)
nRBC: 0 % (ref 0.0–0.2)
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
## 2022-10-27 NOTE — Progress Notes (Signed)
Physical Therapy Session Note  Patient Details  Name: Danny [name] MRN: [id number] Date of Birth: Jan 16, 1951  Today's Date: 10/27/2022 PT Individual Time: 0831-0928 PT Individual Time Calculation (min): 57 min   Short Term Goals: Week 1:  PT Short Term Goal 1 (Week 1): STGs = LTGs  Skilled Therapeutic Interventions/Progress Updates:    Received pt semi-reclined in bed, pt agreeable to PT treatment, and reported pain 0/10 in low back at rest, increasing to 7-8/10 with movement (premedicated). Session with emphasis on functional mobility/transfers, generalized strengthening and endurance, and gait training. Donned pants in supine with mod A for time management purposes and rolled L/R with supervision and donned TLSO in supine (for comfort) with total A. Pt transferred semi-reclined<>sitting EOB using logroll technique with supervision using bedrail and fastened underarm straps of TLSO and donned second gown. Pt stood with RW and supervision from elevated EOB and pt ambulated 161ft with RW and CGA fading to supervision to dayroom. Took seated rest break in [facility] then ambulated 21ft with RW and supervision to Nustep and performed BUE/LE strengthening on Nustep at workload 6 decreasing to 4 (due to increasing pain) for 10 minutes for a total of 422 steps with emphasis on cardiovascular endurance. Pt requested to perform seated vs standing exercises and performed LAQ 2x10 bilaterally with 2.5lb ankle weight with emphasis on LE strength/ROM. Pt stood with RW and CGA and ambulated 177ft with RW and CGA/close supervision back to room and requested to return to bed. Sit<>supine with supervision while adhering to spinal precautions and loosened TLSO (pt requesting to leave it on for upcoming therapy session). Pt became emotional regarding current state - provided emotional support and therapeutic listening. Briefly discussed HHPT vs OPPT options upon discharge. Concluded session with pt semi-reclined in bed, needs  within reach, and bed alarm on. Placed pillows under knees for comfort.   Therapy Documentation Precautions:  Precautions Precautions: Fall, Back Required Braces or Orthoses: Spinal Brace Spinal Brace: Thoracolumbosacral orthotic, Applied in sitting position Restrictions Weight Bearing Restrictions: No  Therapy/Group: Individual Therapy Alfonse Alpers PT, DPT  10/27/2022, 6:56 AM

## 2022-10-27 NOTE — Progress Notes (Signed)
+/-   sleep. Pain managed with scheduled meds. Mostly complains of pain when sitting up. Incontinent of urine at times. Danny [name] A

## 2022-10-27 NOTE — Progress Notes (Signed)
Pt has home CPAP unit. ?

## 2022-10-27 NOTE — Progress Notes (Signed)
Occupational Therapy Session Note  Patient Details  Name: Danny [name] MRN: [id number] Date of Birth: 1951-10-20  Today's Date: 10/27/2022 OT Individual Time: 1103-1200 & 1440-1530 OT Individual Time Calculation (min): 57 min & 70 min   Short Term Goals: Week 1:  OT Short Term Goal 1 (Week 1): STGs+LTGs 2/2 LOS  Skilled Therapeutic Interventions/Progress Updates:  Session 1 Skilled OT intervention completed with focus on d/c planning, DME and tub/shower transfer education. Pt received supine in bed, agreeable to session. Un-rated pain reported, pr pre-medicated, improved with ambulation. Therapist offered rest breaks and repositioning throughout for pain reduction.  Extensive discussion with pt about his home set up, his opinions regarding readiness for leaving by end of week and personal goals with pt indicating his primary concerns are toileting, shower transfers and being able to care for himself with intermittent assist by end of week. Therapist educated on use of urinal or BSC for urgent toileting for home as well as for accessibility needs, with pt agreeable. Also discussed shower set up with pt having walk in shower and tub shower available. Agreeable to try tub/shower transfer then walk in shower at later time.  TLSO already strapped, with pt log rolling to EOB with supervision with cues needed for avoiding L hand being behind him to avoid twisting. Therapist tighten TLSO and fastened arm straps, however discussed pt starting to do this himself as it preps for toileting needs/transfers. CGA sit > stand using RW from elevated bed, then ambulated with CGA from room about 200 ft, part of way to ADL apartment. W/c transported needed rest of way due to increase in low back pain and fatigue.   Education provided on DME available such as tub bench that can be purchased for use at home, suggested use of [facility] for ease of bathing, shower curtain management to prevent water spillage, minimizing  stands via lateral leans for pericare, use of grab bars/installation as well as effective safety strategies for exiting the shower to eliminate falls. Pt was able to demo an ambulatory transfer into bathroom with RW at [facility], then sit on tub bench. With time, was able to manage BLE over tub threshold however with increase in low back pain due to partial lifting of BLE. Discussed how his tub has lower threshold and tub bench could be raised higher. Min A needed to exit due to pain.   Transported dependently in w/c > room. Pt remained seated in w/c, awaiting NT to change bed sheets with NT aware of request, with all needs in reach at end of session.  Session 2 Skilled OT intervention completed with focus on shower transfers, ADL retraining and AE education. Pt received upright in bed, agreeable to session. 2/10 pain reported in lower back, pt pre-medicated, therapist offering rest breaks and shower for pain reduction.  Pt agreeable to try shower for pain relief/muscle relaxation via warm water. Used urinal with mod I at bed level. Fastened straps of TLSO with min A and cues for technique. Transitioned to EOB with supervision using bed rails and good demo of log roll technique. Pt indicated he felt urge to try for BM. Therapist placed wide BSC over top of toilet with legs adjusted for increased height to eliminate pressure on lower spine during sitting. Supervision sit > stand using RW, then ambulatory transfer into bathroom, with pt able to doff pants with supervision for balance. Pt was unsuccessful with BM, however more so limited by c/o pain with seated position on BSC.  Supervision sit > stand then ambulatory transfer to shower. Up to min A needed for balance to transition via side stepping into shower using grab bars, with mod cues needed for safety and precautions. Pt sat on padded tub bench with hole cut out, however with about a min of sitting, pt indicated he wouldn't be able to sit there long enough  for shower due to pain which had increased to 7/10. Therapist advised that if pt's pain impacted him with TLSO donned, that this time might not be appropriate due to having to remove brace for shower and the effects of how that might feel. Pt agreeable to sponge bathe at sink to progress his sitting tolerance out of the TLSO for bathing sinkside first.  Seated at sink, pt bathed LB with LH sponge, and peri-areas in stance with CGA for balance. Sock aid and reacher utilized for [employer] with overall min A needed at the sit > stand level using sink for UE support. Education provided on doffing of TLSO, and then was able to bathe UB with supervision, assist only for cleaning back. Donned shirt with supervision. With education, pt was able to donn TLSO with supervision/cues this session. Oral care with set up A.  Pt expressed his concern regarding the 5-7 day LOS recommendation, as well as his level of independence (mod I) that he'll need to be at by discharge as his wife spends days at a time away and family is intermittent but on an emergent basis. Encouraged pt that team will discuss, but to focus on goals for each day and making progress towards those goals.  Ambulated in room to EOB with supervision using RW, transitioned to supine via log roll with supervision. Unfastened TLSO with supervision. Remained upright in bed with bed alarm on/activated, and with all needs in reach at end of session.    Therapy Documentation Precautions:  Precautions Precautions: Fall, Back Required Braces or Orthoses: Spinal Brace Spinal Brace: Thoracolumbosacral orthotic, Applied in sitting position Restrictions Weight Bearing Restrictions: No    Therapy/Group: Individual Therapy  Blase Mess, MS, OTR/L  10/27/2022, 3:45 PM

## 2022-10-28 DIAGNOSIS — K5900 Constipation, unspecified: Secondary | ICD-10-CM

## 2022-10-28 DIAGNOSIS — I10 Essential (primary) hypertension: Secondary | ICD-10-CM

## 2022-10-28 DIAGNOSIS — M549 Dorsalgia, unspecified: Secondary | ICD-10-CM

## 2022-10-28 NOTE — Progress Notes (Signed)
Occupational Therapy Session Note  Patient Details  Name: Danny [name] MRN: [id number] Date of Birth: 02/07/1951  Today's Date: 10/28/2022 OT Individual Time: 2979-8921 OT Individual Time Calculation (min): 28 min    Short Term Goals: Week 1:  OT Short Term Goal 1 (Week 1): STGs+LTGs 2/2 LOS  Skilled Therapeutic Interventions/Progress Updates: Patient seen for functional transfer training. Patient tolerating sit to stand at the walker with close SBA. Patient discussed chair options for safe home mobility and furniture options to provide the most comfort. Patient reports tolerating the w/c well, but usually sits in firm char at home. Ambulated to and sat in a firm straight back chair, but patient reports that increased his discomfort. He discussed the possibility of obtaining a hospital bed for positioning and due to not having a bed on the first floor at home. Patient reports LE support in bed is where he experiences the least amount of pain. Patient tolerant of sitting on available surfaces such as recliner and chair in home simulation room, but continues to experience increased pain with sitting at a 90/90 LE position. Patient motivated to return home and regain independence but is worried that the only place he is able to rest without significant pain is the hospital bed. Continue with skilled OT POC and working with patient to address equipment needs for a successful discharge.     Therapy Documentation Precautions:  Precautions Precautions: Fall, Back Required Braces or Orthoses: Spinal Brace Spinal Brace: Thoracolumbosacral orthotic, Applied in sitting position Restrictions Weight Bearing Restrictions: No   Pain: Pain Assessment Pain Scale: 0-10 Pain Score: 3  Pain Type: Acute pain Pain Location: Back Pain Orientation: Right;Lower Pain Descriptors / Indicators: Aching Pain Frequency: Constant Pain Onset: Gradual Patients Stated Pain Goal: 0 Pain Intervention(s):  Medication (See eMAR)   Therapy/Group: Individual Therapy  Hermina Barters 10/28/2022, 12:24 PM

## 2022-10-28 NOTE — Progress Notes (Signed)
Pt has home CPAP machine.  
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
## 2022-10-28 NOTE — Progress Notes (Signed)
Physical Therapy Session Note  Patient Details  Name: Danny [name] MRN: [id number] Date of Birth: 22-Dec-1951  Today's Date: 10/28/2022 PT Individual Time: 1540-0867 and 1400-1455 PT Individual Time Calculation (min): 70 min and 55 min  Short Term Goals: Week 1:  PT Short Term Goal 1 (Week 1): STGs = LTGs  Skilled Therapeutic Interventions/Progress Updates:   Treatment Session 1 Received pt semi-reclined in bed with visitors present. Pt agreeable to PT treatment and denied any pain at rest, increasing to 7/10 with mobility (premedicated). Session with emphasis on functional mobility/transfers, generalized strengthening and endurance, dynamic standing balance, stair navigation, and gait training. Pt very anxious this session, expressing concerns with his wife's ability to assist him at home and particularly concerned that he is unable to tolerate sitting on commode due to pain. Provided encouragement, telling pt that he requires very little assist (currently supervision/CGA overall) and that PT/OT plan on doing family education with wife prior to D/C and OT will focus more on comfort with toileting.   Pt transferred semi-reclined<>sitting EOB with HOB elevated and use of bedrails with supervision using logroll technique (TLSO already donned). Pt stood with RW and supervision and ambulated 123ft with RW and close supervision - limited by back pain. Pt transported to stairwell and discussed in great detail multiple sets of steps pt will have to navigate at home - the most being 1 flight of steps with only 1 handrail. Pt then navigated 11 steps with R handrail and CGA using a lateral stepping technique - required seated rest break at top prior to coming down. Discussed placing chair at top of steps at home to rest and getting 2 RWs (one for upstairs and one for downstairs) to decrease burden on wife. Problem solved getting into front door with small porch and storm door that opens outward - ultimately  recommending for wife to place RW inside doorframe and pt to hold onto railing for balance until he reaches doorframe.   In dayroom, pt performed seated BLE strengthening on Kinetron at 30 cm/sec for 1 minute x 4 trials with emphasis on glute/quad strengthening. Took seated rest/water break then stood with RW and supervision and ambulated 118ft with RW and close supervision. Returned to room, stood with RW and supervision, and ambulated 66ft back to bed. Doffed TLSO with supervision and transferred sit<>supine with supervision. Concluded session with pt semi-reclined in bed, needs within reach, and bed alarm on. Pillows placed under knees for comfort.   Treatment Session 2 Received pt semi-reclined in bed, pt agreeable to PT treatment, and denied any pain at rest but reported increased low back pain rated 8/10 at end of session. Session with emphasis on functional mobility/transfers, generalized strengthening and endurance, dynamic standing balance, simulated car transfers, stair navigation, and gait training. Donned pants in supine with supervision via bridging and pt transferred semi-reclined<>sitting EOB with HOB elevated and use of bedrails with supervision (using logroll technique) and donned TLSO sitting EOB with min A. Pt stood with RW and supervision and ambulated 136ft with RW and supervision - limited by increased back pain. Pt transported to ortho gym and performed ambulatory simulated car transfer with RW and supervision with cues for back precautions and to keep legs together. Pt then requested to practice stair navigation again and navigated 11 steps with R handrail and CGA using a lateral stepping technique - this time with no rest break at top. Pt then requested to work on upper body exercises and performed the following exercises: -WC  push ups 2x8 -seated single arm bicep curls with 9lb dumbbell 2x10 bilaterally -seated single arm overhead chest press with 7lb dumbbell 2x10  bilaterally -seated horizontal chest press at 90 degrees with 10lb dowel 3x10 Pt transported back to room in WC dependently. Stood with RW and supervision and ambulated back to bed. Doffed TLSO with supervision and transferred sit<>supine with supervision. RN arrived to administer medications and concluded session with pt semi-reclined in bed with all needs within reach. Pillows placed under knees for comfort.   Therapy Documentation Precautions:  Precautions Precautions: Fall, Back Required Braces or Orthoses: Spinal Brace Spinal Brace: Thoracolumbosacral orthotic, Applied in sitting position Restrictions Weight Bearing Restrictions: No  Therapy/Group: Individual Therapy Martin Majestic PT, DPT  10/28/2022, 6:56 AM

## 2022-10-28 NOTE — Progress Notes (Signed)
Occupational Therapy Session Note  Patient Details  Name: Danny [name] MRN: [id number] Date of Birth: Sep 27, 1951  Today's Date: 10/28/2022 OT Individual Time: 2355-7322 OT Individual Time Calculation (min): 75 min    Short Term Goals: Week 1:  OT Short Term Goal 1 (Week 1): STGs+LTGs 2/2 LOS  Skilled Therapeutic Interventions/Progress Updates:  Skilled OT intervention completed with focus on emotional support, ADL retraining, functional transfers, problem solving toileting needs. Pt received semi-supine in bed, agreeable to session. 3/10 pain reported in low back, nurse in room to administer meds. Therapist offered rest breaks and repositioning throughout for pain reduction.  Pt initially wanting to discuss in length his concern with anticipated d/c date, his current status and his wife's concerns regarding her inability to care for pt due to physical limitations. Therapist educated pt on his current status of supervision with mobility and and at most min A for ADLs however with AE practice and sitting tolerance could be less assist, as well as rehab goals of mod I/supervision. Pt did become tearful, with therapist offering emotional support as well as recommending that pt's wife attend sessions if able for supporting them both with building confidence with discharge plans. Also informed pt of team conference with this therapist plan to relay their concerns when determining the official date for discharge. CSW notified of consult need for neuro psych per pt agreement for adjustment and coping support.  Transitioned to EOB via log roll with supervision using bed features. Donned TLSO in sitting with supervision, therapist only offering cues for technique. Threaded pants with use of reacher, with overall supervision at the sit > stand level using RW. Ambulatory transfer with supervision to w/c at sink. Completed oral care and facial hygiene with mod I seated.  Rest of session with focus on  determine toileting needs for eliminating compression on spine with BSC. Ambulated to wide BSC over toilet (elevated highly), with therapist providing pillows behind back, towel right at back of toilet hole. Initial discomfort at low back, however with cues for positioning sacrum towards center of toilet hole vs the back with significant improvement reported and pt able to tolerate sitting for >7 mins without pain. Discussed use of this technique for tub bench with cut out for pain relief as well for showering needs. Ambulated to standard [facility] with attempt, however with previous technique, too much pressure was applied causing inability to sit. Ambulated to w/c. Pt remained seated in w/c, with direct care handoff to next OT, and with all needs in reach at end of session.   Therapy Documentation Precautions:  Precautions Precautions: Fall, Back Required Braces or Orthoses: Spinal Brace Spinal Brace: Thoracolumbosacral orthotic, Applied in sitting position Restrictions Weight Bearing Restrictions: No    Therapy/Group: Individual Therapy  Blase Mess, MS, OTR/L  10/28/2022, 12:07 PM

## 2022-10-29 NOTE — Progress Notes (Addendum)
Patient ID: Danny [name], male   DOB: 11-13-1951, 71 y.o.   MRN: [id number]  [facility] referral sent to [facility] Patient approved by Angie C. Orders emailed.

## 2022-10-29 NOTE — Progress Notes (Signed)
Occupational Therapy Note  Patient Details  Name: Danny [name] MRN: [id number] Date of Birth: [birth year]   The following the equipment is recommended by occupational therapy to increase pt's ability to perform ADL and decr burden of care:  -Wide/bariatric BSC vs standard BSC due to the amount of compression placed on spine during seated toileting with a standard commode. Pt needs amount of surface area on wide BSC for posterior lean with use of pillows to offload the sacrum for safe/effective toileting to maintain his independence at discharge as pt currently cannot tolerate seated toileting due to pain on regular BSC.    Raden Byington E Jaryan Chicoine, MS, OTR/L  10/29/2022, 4:00 PM

## 2022-10-29 NOTE — Patient Care Conference (Signed)
Inpatient RehabilitationTeam Conference and Plan of Care Update Date: 10/29/2022   Time: 11:38 AM    Patient Name: Danny [name]      Medical Record Number: [id number]  Date of Birth: 04-18-1951 Sex: Male         Room/Bed: 4W23C/4W23C-01 Payor Info: Payor: MEDICARE / Plan: MEDICARE PART A AND B / Product Type: *No Product type* /    Admit Date/Time:  10/23/2022 12:36 PM  Primary Diagnosis:  Burst fracture of lumbar vertebra [facility])  Hospital Problems: Principal Problem:   Burst fracture of lumbar vertebra [facility])    Expected Discharge Date: Expected Discharge Date: 11/04/22  Team Members Present: Physician leading conference: Dr. Sula Soda Social Worker Present: Lavera Guise, BSW Nurse Present: Chana Bode, RN PT Present: Raechel Chute, PT OT Present: Candee Furbish, OT SLP Present: Feliberto Gottron, SLP PPS Coordinator present : Fae Pippin, SLP     Current Status/Progress Goal Weekly Team Focus  Bowel/Bladder   Patient is continent of bladder/bowel   Maitain continence   Assess toileting needs QS/PRN    Swallow/Nutrition/ Hydration               ADL's   Supervision UB bathing, min A UB dressing for TLSO, min A LB bathing/dressing, min A toileting. Generally limited by sitting tolerance due to pain from compression. Tried shower but pt unable to sit even with brace on on tub bench   Mod I ADLs, supervision transfers   pain management, endurance, AE education, ADL retraining, sitting tolerance    Mobility   bed mobility supervision, transfers with RW CGA/supervision, gait 144ft with RW and supervision, 12 steps 2 rails and CGA   supervision  functional mobility/transfers, back precautions and brace management, generalized strengthening and endurance, dynamic standing balance/coordination, gait training, and D/C planning    Communication                Safety/Cognition/ Behavioral Observations               Pain   Chronic back pain addressed  with heat and medications       Pain < 4 with scheduled and prn meds    Assess need for and effectiveness of medications  Skin     N/a            Discharge Planning:  Discharging home with spouse, unable to provide physical assistance. Wife and 2 daughters able to assist at home 24/7   Team Discussion: Patient' pain addressed and constipation addressed; now with incontinence due to inability to sit on toilet. Patient challenged by seated ADLs but cannot stand long enough to shower; sponge bathing at present.  Patient on target to meet rehab goals: yes, currently needs supervision for donning/doffing of TLSO brace and supervision for transfers. Requires supervision for upper body bathing and dressing and min assist for lower body care with CGA for toileting. Able to ambulate up to 135' with a RW and manage 12 steps with bil rails.   *See Care Plan and progress notes for long and short-term goals.   Revisions to Treatment Plan:  Shower/bathing techniques   Teaching Needs: Safety, back precautions, medications, transfers, toileting, etc.  Current Barriers to Discharge: Inaccessible home environment and Lack of/limited family support  Possible Resolutions to Barriers: Family education HH follow up services DME: 3N1/BSC, RW, hospital bed, w/c     Medical Summary Current Status: compression fracture, spinal pain, morbid obesity, constipation  Barriers to Discharge: Medical stability  Barriers to  Discharge Comments: compression fracture, spinal pain, morbid obesity, constipation Possible Resolutions to [employer]: provided education regarding prognosis, continue pain medication/TLSO/aquathermia, provided dietary education, continue laxatives   Continued Need for Acute Rehabilitation Level of Care: The patient requires daily medical management by a physician with specialized training in physical medicine and rehabilitation for the following reasons: Direction of a  multidisciplinary physical rehabilitation program to maximize functional independence : Yes Medical management of patient stability for increased activity during participation in an intensive rehabilitation regime.: Yes Analysis of laboratory values and/or radiology reports with any subsequent need for medication adjustment and/or medical intervention. : Yes   I attest that I was present, lead the team conference, and concur with the assessment and plan of the team.   Chana Bode B 10/29/2022, 2:22 PM

## 2022-10-29 NOTE — Progress Notes (Signed)
Occupational Therapy Session Note  Patient Details  Name: Danny [name] MRN: [id number] Date of Birth: 1951/10/10  Today's Date: 10/29/2022 OT Individual Time: 5188-4166 OT Individual Time Calculation (min): 75 min    Short Term Goals: Week 1:  OT Short Term Goal 1 (Week 1): STGs+LTGs 2/2 LOS  Skilled Therapeutic Interventions/Progress Updates:  Skilled OT intervention completed with focus on shower/toileting positioning for comfort with spinal compression-related pain, functional transfers, d/c planning. Pt received upright in bed, agreeable to session. Un-rated generalized pain reported in low back, pre-medicated. Therapist offered modifications, rest breaks and repositioning throughout for pain reduction.  Wife unable to attend current OT session as discussed with pt yesterday however reports trying to make later PT sessions for education. Offered pt a shower, with plan to see how he tolerates sitting on padded tub bench with opening, using same posterior pelvic tilt technique that has offered increased comfort to pt during toileting needs.   Completed bed mobility via log roll and use of bed features with supervision. Min A needed for donning TLSO as straps were tangled. Supervision sit > stand and ambulatory transfer into bathroom using RW. CGA side step transfer into shower using grab bars, min cues for hand placement to maintain back precautions. Pt was able to tolerate about 10 mins seated on bench with described method, however did require heavy posterior lean and hand support for offloading due to pain. Discussed in detail that pain might increase if doffing TLSO and that if pt is unable to tolerate sitting on bench with the brace on and he has to semi-lay back, that this wouldn't be the most functional or independent method for self-bathing at home due to mod I level need. Advised that pt sponge bathe at sink for increased independence/comfort until swelling and overall pain decreases  then utilize [facility] and current DME to shower at later time; pt receptive and agreeable. Handouts provided for padded tub bench and full hip to purchase for home at later time if he decides.  Requested to try for BM. Completed CGA sit > stand using grab bar, then CGA side step to RW, with cues needed to avoid twisting to reach RW. Supervision stand pivot to [facility]. Able to manage brief with supervision. Pt required increased time for attempt at [facility], however did demo ability to tolerate sitting on wide BSC with pelvic tilt method, comfortably. Continent of large BM. Encouraged pt to stand for posterior peri-care to prevent excessive twisting and ease of access. Supervision sit > stand and supervised toileting with encouragement for bending of knees to access posterior peri-area. Ambulated to sink, mod I oral care. Supervision stand pivot to EOB, removal of TLSO and bed mobility to supine. Pt remained upright in bed, with bed alarm on/activated, and with all needs in reach at end of session.   Therapy Documentation Precautions:  Precautions Precautions: Fall, Back Required Braces or Orthoses: Spinal Brace Spinal Brace: Thoracolumbosacral orthotic, Applied in sitting position Restrictions Weight Bearing Restrictions: No    Therapy/Group: Individual Therapy  Melvyn Novas, MS, OTR/L  10/29/2022, 12:13 PM

## 2022-10-29 NOTE — Progress Notes (Signed)
Pt has home supplements at bedside. Stated he spoke with doctor and has approval. Clarified with Dr. Carlis Abbott and pt is OK to keep supplements at bedside.   Marylu Lund, RN
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
## 2022-10-29 NOTE — Progress Notes (Signed)
Patient ID: Danny [name], male   DOB: March 31, 1951, 71 y.o.   MRN: [id number]  Sw made attempt to call patient spouse, Angelique Blonder. Mailbox full, sw will follow up with patient.

## 2022-10-29 NOTE — Progress Notes (Signed)
Patient ID: Danny [name], male   DOB: August 26, 1951, 71 y.o.   MRN: [id number]  Team Conference Report to Patient/Family  Team Conference discussion was reviewed with the patient and caregiver, including goals, any changes in plan of care and target discharge date.  Patient and caregiver express understanding and are in agreement.  The patient has a target discharge date of 11/04/22.  Sw met with patient and spouse and provided team conference updates. Patient concerned that she will be unable to assist patient at home due to being unable to provide 24/7 supervision and patient will require SNF. Patient and SW discussed patient progress and spouse will remain for PT session today to get a better idea of patient LOC. Patient anticipates discharging home with [facility]. Patient will need a RW, HB and WC. No additional questions or concerns.  Dyanne Iha 10/29/2022, 1:40 PM
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
## 2022-10-29 NOTE — Progress Notes (Signed)
Patient ID: Danny [name], male   DOB: 1951-03-31, 71 y.o.   MRN: [id number]  [facility] Bedside Commode, Wheelchair, Hospital Bed and rolling walker ordered through [employer].

## 2022-10-29 NOTE — Progress Notes (Signed)
Physical Therapy Session Note  Patient Details  Name: Danny [name] MRN: [id number] Date of Birth: [birth year]  Today's Date: 10/29/2022 PT Individual Time: 3086-5784 and 1401-1457 PT Individual Time Calculation (min): 55 min and 56 min  Short Term Goals: Week 1:  PT Short Term Goal 1 (Week 1): STGs = LTGs  Skilled Therapeutic Interventions/Progress Updates:   Treatment Session 1 Received pt semi-reclined in bed, pt agreeable to PT treatment, and reported mild pain at rest increasing to 7-8/10 in low back with activity (premedicated). Session with emphasis on functional mobility/transfers, generalized strengthening and endurance, standing balance, and gait training. Pt with questions regarding hospital bed, stating the only position he is pain-free is in hospital bed. Pt also requesting WC for community distances and to have comfortable place to sit at home - placed order for 20x18 manual WC and hospital bed. Pt transferred semi-reclined<>sitting EOB with HOB elevated and use of bedrails with supervision using logroll technique. Donned TLSO sitting EOB with min A. Pt performed all transfers with RW and close supervision throughout session. Pt ambulated 175ft with RW and supervision - limited by low back pain. Pt then performed the following standing exercises with emphasis on LE strength and decreasing reliance on UE support: -heel raises x10 -alternating marches x20 unweighted and x20 with 3lb ankle weight -hip abduction 2x12 bilaterally with 3lb ankle weight Pt transported back to room in [facility] dependently and ambulated back to bed. Doffled TLSO with supervision and transferred sit<>supine with supervision. Concluded session with pt semi-reclined in bed, needs within reach, and bed alarm on.   Treatment Session 2 Received pt semi-reclined in bed with wife present for family education training. Pt agreeable to PT treatment and did not state pain level during session but appeared to be in continued  pain with load bearing activities. Session with emphasis on functional mobility/transfers, generalized strengthening and endurance, stair navigation, simulated car transfers, D/C planning, and gait training. Pt transferred semi-reclined<>sitting EOB with HOB elevated and use of bedrails with supervision using logroll technique. Donned TLSO sitting EOB with supervision - educated pt's wife on proper donning of brace. Pt performed all transfers with RW and close supervision throughout session. Pt ambulated ~119ft with RW and supervision - limited by back pain. In stairwell pt navigated 11 steps using R handrail and CGA using a lateral stepping technique - pt's wife not feeling well enough to practice with pt today but agreed to practice with hands on training in upcoming sessions. Pt then performed simulated car transfer with RW and supervision - cues to maintain spinal precautions. Educated pt/wife on WC parts management including donning/doffing legrests. Discussed HHPT vs OPPT with pt's decision to begin with HHPT and transition to OPPT due to wife's work schedule. Problem solved other concerns (types of surfaces in home, additions for equipment, where to place equipment, and energy conservation tips when at home and in the community). Returned to room and ambulated back to bed. Doffed TLSO with supervision and transferred sit<>supine with supervision. Concluded session with pt semi-reclined in bed, needs within reach, and bed alarm on.   Therapy Documentation Precautions:  Precautions Precautions: Fall, Back Required Braces or Orthoses: Spinal Brace Spinal Brace: Thoracolumbosacral orthotic, Applied in sitting position Restrictions Weight Bearing Restrictions: No  Therapy/Group: Individual Therapy Martin Majestic PT, DPT  10/29/2022, 7:06 AM

## 2022-10-30 NOTE — Progress Notes (Signed)
Pt has home CPAP machine, no assistance needed.  

## 2022-10-30 NOTE — Progress Notes (Signed)
Physical Therapy Session Note  Patient Details  Name: Danny [name] MRN: [id number] Date of Birth: 09-19-1951  Today's Date: 10/30/2022 PT Individual Time: 1300-1345 PT Individual Time Calculation (min): 45 min   Short Term Goals: Week 1:  PT Short Term Goal 1 (Week 1): STGs = LTGs  Skilled Therapeutic Interventions/Progress Updates:    pt received in bed and agreeable to therapy. Pt reports 3/10 pain at rest and 8/10 with mobility, premedicated. Rest and positioning provided as needed. Supine>sit with supervision. Pt donned TLSO with supervision and cues sitting EOB. Pt ambulated x 100 ft to day room with CGA and RW for endurance and functional mobility. Pt requested global strengthening this session having worked on functional gait and stairs earlier in the day. Pt performed squats for global strength 4 x 8 with extended rest breaks for pain management. On last set, pt performed Sit to stand without UE support, CGA and squats with "2 finger" support on RW, reporting increased difficulty. Pt then ambulated 2 x 150 ft with CGA and RW, with 7 lb ankle weights in place for increased endurance work. Pt transported  back to room d/t fatigue, performed ambulatory transfer with CGA and RW. Sit>supine with supervision and doffed brace with supervision. Pt remained in bed and was left with all needs in reach and alarm active.   Therapy Documentation Precautions:  Precautions Precautions: Fall, Back Required Braces or Orthoses: Spinal Brace Spinal Brace: Thoracolumbosacral orthotic, Applied in sitting position Restrictions Weight Bearing Restrictions: No General:       Therapy/Group: Individual Therapy  Juluis Rainier 10/30/2022, 1:17 PM

## 2022-10-30 NOTE — Progress Notes (Signed)
Physical Therapy Session Note  Patient Details  Name: Danny [name] MRN: [id number] Date of Birth: May 14, 1951  Today's Date: 10/30/2022 PT Individual Time: 0730-0826 and 1030-1054 PT Individual Time Calculation (min): 56 min and 24 min  Short Term Goals: Week 1:  PT Short Term Goal 1 (Week 1): STGs = LTGs  Skilled Therapeutic Interventions/Progress Updates:   Treatment Session 1 Received pt semi-reclined in bed, pt agreeable to PT treatment, and reported pain 2-3/10 at rest increasing to 7-8/10 with movement. Session with emphasis on functional mobility/transfers, generalized strengthening and endurance, dynamic standing balance/coordination, and gait training. Donned pants in supine with min A to thread LLE through and pt bridged to pull pants over hips with supervision. RN arrived to administer medication. Pt performed bed mobility with supervision/mod I x 2 trials throughout session using logroll technique. Pt able to don/doff TLSO in sitting with supervision. Pt performed all transfers with RW and supervision throughout session. Pt ambulated 142ft with RW and supervision to dayroom - provided pt with walker bag. Worked on dynamic standing balance performing alternating toe taps to 8in step 3x20 reps with CGA and emphasis on decreasing reliance on UE support. Pt then ambulated 124ft x 1 and 41ft x 1 with RW and supervision with emphasis on promoting endurance. Pt then worked on Librarian, academic horseshoes with RUE x 2 trials with supervision with emphasis on decreasing reliance on UE support. Pt required multiple rest breaks throughout session due to increased low back pain. Returned to room and ambulated back to bed. Concluded session with pt semi-reclined in bed, needs within reach, and bed alarm on.   Treatment Session 2 Received pt sitting in WC with TLSO donned. Pt agreeable to PT treatment and did not state pain level during session but still in pain with any load bearing  positions. Session with emphasis on functional mobility/transfers, stair navigation, and gait training. Pt performed all transfers with RW and supervision throughout session. Pt ambulated 164ft with RW and supervision - limited by back pain. In stairwell pt navigated 11 steps x 2 trials with R handrail and CGA/close supervision using a lateral stepping technique. Pt required seated rest/water break in between trials but demonstrates improvement in technique and confidence with stair navigation. Returned to room and ambulated back to bed. Removed TLSO and transferred sit<>supine with supervision/mod I. Concluded session with pt semi-reclined in bed, needs within reach, and bed alarm on.   Therapy Documentation Precautions:  Precautions Precautions: Fall, Back Required Braces or Orthoses: Spinal Brace Spinal Brace: Thoracolumbosacral orthotic, Applied in sitting position Restrictions Weight Bearing Restrictions: No  Therapy/Group: Individual Therapy Martin Majestic PT, DPT  10/30/2022, 6:56 AM

## 2022-10-30 NOTE — Progress Notes (Signed)
Patient ID: Danny [name], male   DOB: [birth year], 71 y.o.   MRN: [id number]  Contact information for [employer] provided to patient to process DME order. Home care resources provided.

## 2022-10-30 NOTE — Progress Notes (Signed)
Occupational Therapy Session Note  Patient Details  Name: Danny [name] MRN: [id number] Date of Birth: 07-03-1951  Today's Date: 10/30/2022 OT Individual Time: 0905-1000 OT Individual Time Calculation (min): 55 min    Short Term Goals: Week 1:  OT Short Term Goal 1 (Week 1): STGs+LTGs 2/2 LOS  Skilled Therapeutic Interventions/Progress Updates:  Skilled OT intervention completed with focus on toileting education, independence with ADLs to prep for discharge including pt setting up his on toiletry items etc. Pt received upright in bed, agreeable to session. No pain reported during session.  Per pt questions, therapist educated on the different kinds of "toileting accidents" in which pt's case per report is he knows when he needs to go and often times gets up to go with some leakage but intentionally going in the brief because it is inconvenient to get up sometimes especially after just trying to go on the commode. Advised pt to use brief for the mean time due to leakage, however with him holding himself accountable to try and get up even if inconvenient as some leakage is better than going home, intentionally going in bed and then being responsible for that at home at the mod I level. Pt was receptive and agreeable.  Sat EOB maintaining spinal precautions with supervision. Donned TLSO with supervision with increased time. All sit > stands and transfers at the supervision level using RW. Ambulated to w/c at sink.   Seated at sink, therapist challenged pt to start thinking through ADL set up and what steps he would do to prep for discharge. Pt did require mod cues throughout to remind him about sequencing with maintaining TLSO during standing parts of sink side bath, however pt was able to use reacher for LB dressing and all dressing completed at the supervision level this session at the sit stand level using RW.  Discussed options for setting up his own bath at home at counter sink due to  inaccessibility of bathroom or use of hygiene wipes. Despite education, pt did inquire hiring a caregiver to do an efficient full bath, with therapist notifying CSW per request.   Pt remained seated in w/c, with all needs in reach at end of session.   Therapy Documentation Precautions:  Precautions Precautions: Fall, Back Required Braces or Orthoses: Spinal Brace Spinal Brace: Thoracolumbosacral orthotic, Applied in sitting position Restrictions Weight Bearing Restrictions: No    Therapy/Group: Individual Therapy  Melvyn Novas, MS, OTR/L  10/30/2022, 10:58 AM
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
## 2022-10-31 NOTE — Progress Notes (Signed)
Had a quite night. Denies pain or SOB. CPAP nocte.Ambulatory with walker to the bathroom. Safety maintained.
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
## 2022-10-31 NOTE — Progress Notes (Signed)
Patient ID: Danny [name], male   DOB: 1951/02/25, 71 y.o.   MRN: [id number]  Sw informed [employer] has made 4x to reach patient spouse in reference to delivery. Patient informed. SW provided patient and spouse with number for [employer] to follow up on DME delivery.

## 2022-10-31 NOTE — Progress Notes (Signed)
Physical Therapy Session Note  Patient Details  Name: Danny [name] MRN: [id number] Date of Birth: 1951-11-23  Today's Date: 10/31/2022 PT Individual Time: 917 035 [id number] and 5726-2035 PT Individual Time Calculation (min): 72 min and 43 min  Short Term Goals: Week 1:  PT Short Term Goal 1 (Week 1): STGs = LTGs  Skilled Therapeutic Interventions/Progress Updates:   Treatment Session 1 Received pt semi-reclined in bed, pt agreeable to PT treatment, and reported pain 6-7/10 in low back (premedicated) with movement. Session with emphasis on functional mobility/transfers, generalized strengthening and endurance, dynamic standing balance/coordination, community navigation, and gait training. Pt performed bed mobility with supervision using bed features and logroll technique x 2 trials throughout session with supervision and able to don/doff TLSO without assist. Pt performed all transfers with RW and supervision throughout session.   Pt transported outside to entrance of [facility] in WC dependently for time management purposes. Pt ambulated 161ft x 1, 112ft x 1, and 118ft x 1 with RW and close supervision along concrete and up/down slopes and hills. Pt performed WC mobility 22ft using BUE and supervision, then navigated 8 steps with R handrail and close supervision using a lateral stepping technique. Pt also practiced stepping up/down 4in curb with RW and CGA with cues for technique and RW safety. Pt reports his goal is to return to walking around the neighborhood with his friends - discussed steps to gradually progress this goal while maintaining safety and working on energy conservation.   In dayroom, pt performed seated BLE strengthening on Kinetron at 30 cm/sec for 5 minutes with emphasis on glute/quad strength (positioned WC at 45 degree angle for comfort on low back). Pt ambulated additional 117ft with RW and supervision around nurses station with emphasis on decreasing reliance on BUE support on RW. Pt  reported increased pain and fatigue stating "okay, that's enough" and performed WC mobility 176ft using BUE and supervision back to room with emphasis on UE strength. Returned to bed and concluded session with pt semi-reclined in bed, needs within reach, and bed alarm on.   Treatment Session 2 Received pt semi-reclined in bed with NT checking vitals. Pt agreeable to PT treatment and reported pain 2-3/10 in low back at rest increasing to 7-8/10 with mobility (premedicated). Session with emphasis on functional mobility/transfers, generalized strengthening and endurance, dynamic standing balance/coordination, and gait training. Pt performed bed mobility with supervision using bed features and logroll technique x 2 trials throughout session with supervision and able to don/doff TLSO without assist. Pt performed all transfers with RW and supervision throughout session. Pt ambulated 169ft with RW and supervision towards main gym - stopped due to increased back pain. Pt then performed the following seated exercises with emphasis on UE and core strength: -body blade horizontal shoulder flexion at 90 degrees 2x30 seconds bilaterally  -body blade shoulder flexion, ER, and abduction 2x30 seconds bilaterally -pronation/supination "driving the car" with 5lb dowel 2x30 seconds  -modified crunches from WC level 2x8 - cues to exhale upon exertion Pt then performed lateral side steps onto 4in step 2x10 reps bilaterally with BUE support on parallel bars and close supervision. Returned to room and concluded session with pt semi-reclined in bed, needs within reach, and bed alarm on.   Therapy Documentation Precautions:  Precautions Precautions: Fall, Back Required Braces or Orthoses: Spinal Brace Spinal Brace: Thoracolumbosacral orthotic, Applied in sitting position Restrictions Weight Bearing Restrictions: No  Therapy/Group: Individual Therapy Martin Majestic PT, DPT  10/31/2022, 7:02 AM

## 2022-10-31 NOTE — Progress Notes (Signed)
Occupational Therapy Session Note  Patient Details  Name: Danny [name] MRN: [id number] Date of Birth: 24-Mar-1951  Today's Date: 10/31/2022 OT Individual Time: 2831-5176 OT Individual Time Calculation (min): 45 min    Short Term Goals: Week 1:  OT Short Term Goal 1 (Week 1): STGs+LTGs 2/2 LOS  Skilled Therapeutic Interventions/Progress Updates:    Upon OT arrival, pt semi recumbent in bed reporting no pain and is agreeable to OT treatment. Treatment intervention with a focus on self care retraining and strengthening. Pt completes supine to sit transfer performing log roll technique with Supervision and sits EOB to donn TLSO brace with SBA. Pt completes stand step transfer to w/c without DME and SBA. Pt propels himself infront of the sink to perform grooming tasks including washing face, brushing teeth, and shaving independently. Pt completes UB bathing independently and UB dressing with Setup using his wipes because "pt wants to replicate what he will be doing at home". Pt propels himself down to dayroom with Supervision and sits in w/c to complete UB exercises including shoulder flex/ext, shoulder abd/add, chest press, bicep curls, supin/pronat, and wrist flex/ext for 1x10 reps. Pt was transported back to his room for time management and completes stand step transfer to bed with SBA. Pt completes sit to supine transfer with Supervision and was left in bed at end of session with all needs met and safety measures in place.   Therapy Documentation Precautions:  Precautions Precautions: Fall, Back Required Braces or Orthoses: Spinal Brace Spinal Brace: Thoracolumbosacral orthotic, Applied in sitting position Restrictions Weight Bearing Restrictions: No    Therapy/Group: Individual Therapy  Marvetta Gibbons 10/31/2022, 7:42 AM

## 2022-10-31 NOTE — Progress Notes (Signed)
Occupational Therapy Session Note  Patient Details  Name: Danny [name] MRN: [id number] Date of Birth: 04-05-1951  Today's Date: 10/31/2022 OT Individual Time: 1300-1345 OT Individual Time Calculation (min): 45 min    Short Term Goals: Week 1:  OT Short Term Goal 1 (Week 1): STGs+LTGs 2/2 LOS  Skilled Therapeutic Interventions/Progress Updates:    Pt received supine with no c/o pain, agreeable to OT session. TLSO already on. He came to EOB with (S) with heavy reliance on bed rail.s He completed 150 ft of functional mobility to the dayroom with the RW with (S) overall. Extensive discussion re shower chair and pain management. Brought pt to the shower chair room to demonstrate options for shower chairs. He was willing to try the reclining back shower chair- will leave note for next OT. In the therapy gym he completed dynamic balance training focus on ankle righting reactions- stepping on and off of an airex pad with the RW for support- (S) overall. He completed 2x8 repetitions. He then completed isolated single leg stance with targets outside of BOS with BUE support- (S) overall with cueing for reducing UE reliance. He completed 200 ft of functional mobility back to his room with (S) with very little reliance on the RW. He was left supine with all needs met.    Therapy Documentation Precautions:  Precautions Precautions: Fall, Back Required Braces or Orthoses: Spinal Brace Spinal Brace: Thoracolumbosacral orthotic, Applied in sitting position Restrictions Weight Bearing Restrictions: No  Therapy/Group: Individual Therapy  Curtis Sites 10/31/2022, 6:44 AM

## 2022-11-01 NOTE — Progress Notes (Signed)
Occupational Therapy Session Note  Patient Details  Name: Danny [name] MRN: [id number] Date of Birth: Oct 05, 1951  Today's Date: 11/01/2022 OT Individual Time: 6599-3570 OT Individual Time Calculation (min): 58 min    Short Term Goals: Week 1:  OT Short Term Goal 1 (Week 1): STGs+LTGs 2/2 LOS  Skilled Therapeutic Interventions/Progress Updates:    Upon OT arrival, pt semi recumbent in bed. Pt reports no pain and is agreeable to OT treatment. Treatment intervention with a focus on self care retraining, strengthening, functional mobility, dynamic standing balance. Pt donns pants with setup assist. Pt completes supine to sit transfer independently and sits EOB to donn TLSO with Supervision. Pt completes sit to stand transfer with Supervision and ambulates to main therapy gym with RW and Supervision. Pt completes stand to sit transfer with Supervision. Pt sits at tabletop to retrieve push pins using B UE with 1.5lb wrist weights and push pins into moderately complex graphic designs. Pt completes with rest break in between trials and tolerates light strengthening and endurance work well. Pt returns all items to the box without difficulty. Pt completes sit to stand transfer and ambulates with RW to dayroom gym with Supervision and completes kinetron for 8 minutes at a steady pace without rest breaks. Pt was transported  back to his room via w/c and Total A and competes stand step transfer without AD with Supervision. Pt doffs TLSO independently and completes sit to supine transfer independently.  Therapy Documentation Precautions:  Precautions Precautions: Fall, Back Required Braces or Orthoses: Spinal Brace Spinal Brace: Thoracolumbosacral orthotic, Applied in sitting position Restrictions Weight Bearing Restrictions: No   Therapy/Group: Individual Therapy  Carolin Sicks 11/01/2022, 9:13 AM

## 2022-11-01 NOTE — Progress Notes (Signed)
Pt has an at home cpap machine and will place it on himself when ready, no assistance needed at this time.

## 2022-11-01 NOTE — Progress Notes (Signed)
Occupational Therapy Session Note  Patient Details  Name: DEMITRIS POKORNY MRN: [id number] Date of Birth: 1951-09-04  Today's Date: 11/01/2022 OT Individual Time: 1300-1400 OT Individual Time Calculation (min): 60 min    Short Term Goals: Week 1:  OT Short Term Goal 1 (Week 1): STGs+LTGs 2/2 LOS  Skilled Therapeutic Interventions/Progress Updates:    Upon OT arrival, pt semi recumbent in bed requesting to wash up and reports no pain. Pt agreeable to OT treatment session. Treatment intervention with a focus on self care retraining, endurance, dynamic standing balance, IADLs, activity tolerance, and discharge planning. Pt completes oral care and washes face independently. Pt then completes UB bathing and dressing with Setup. Pt completes sit to stand transfer with Supervision and ambulates towards rehab apartment with RW and Supervision. Pt's TLSO needed to be adjusted with assist. Pt unable to make it all the way to rehab apartment before requiring a rest break with Supervision. Pt propels himself remainder of the way to kitchen and engages in kitchen mobility task. Pt completes sit to stand transfer with Supervision using RW and retrieve dishware, pots, pans, non perishables from cabinets, drawers, appliances requiring verbal cues for safe technique to maintain spinal precautions. Discussed pt's kitchen setup and recommended energy conservation techniques including chair placements within the house for rest breaks. Pt also requesting recommendation for exercise post discharge for functional mobility and was provided recommendation. Pt verbalizes understanding. Pt propels himself to ortho gym to complete arm bike for 4 minutes before reporting discomfort in back therefore task was ended. Pt was transported back to his room via w/c and total A and completes stand step transfer to bed with Supervision and doffs TLSO independently. Pt completes sit to supine transfer independently and was left in bed at end  of session with all needs met and safety measures in place.   Therapy Documentation Precautions:  Precautions Precautions: Fall, Back Required Braces or Orthoses: Spinal Brace Spinal Brace: Thoracolumbosacral orthotic, Applied in sitting position Restrictions Weight Bearing Restrictions: No    Therapy/Group: Individual Therapy  Kein Carlberg 11/01/2022, 1:10 PM

## 2022-11-01 NOTE — Progress Notes (Signed)
Slept good. Wearing home CPAP machine. Pain managed with scheduled pain meds. Alfredo Martinez A

## 2022-11-02 DIAGNOSIS — S32001D Stable burst fracture of unspecified lumbar vertebra, subsequent encounter for fracture with routine healing: Secondary | ICD-10-CM

## 2022-11-02 MED ORDER — ACETAMINOPHEN 500 MG PO TABS
500.0000 mg | ORAL_TABLET | Freq: Three times a day (TID) | ORAL | Status: DC
  Administered 2022-11-02 – 2022-11-04 (×6): 500 mg via ORAL
  Filled 2022-11-02 (×6): qty 1

## 2022-11-02 NOTE — Progress Notes (Signed)
Patient placed himself on home CPAP for the night.  

## 2022-11-02 NOTE — Progress Notes (Signed)
Patient concerned about taking too many meds. Only wants to take tylenol 500mg 's vs 1000 mg's. Refused miralax. A
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
## 2022-11-03 LAB — BASIC METABOLIC PANEL
Anion gap: 10 (ref 5–15)
BUN: 8 mg/dL (ref 8–23)
CO2: 23 mmol/L (ref 22–32)
Calcium: 8.9 mg/dL (ref 8.9–10.3)
Chloride: 102 mmol/L (ref 98–111)
Creatinine, Ser: 0.98 mg/dL (ref 0.61–1.24)
GFR, Estimated: >60 mL/min (ref >60)
Glucose, Bld: 109 mg/dL — ABNORMAL HIGH (ref 70–99)
Potassium: 4 mmol/L (ref 3.5–5.1)
Sodium: 135 mmol/L (ref 135–145)

## 2022-11-03 LAB — CBC
HCT: 40.9 % (ref 39.0–52.0)
Hemoglobin: 13.7 g/dL (ref 13.0–17.0)
MCH: 29.2 pg (ref 26.0–34.0)
MCHC: 33.5 g/dL (ref 30.0–36.0)
MCV: 87.2 fL (ref 80.0–100.0)
Platelets: 221 10*3/uL (ref 150–400)
RBC: 4.69 MIL/uL (ref 4.22–5.81)
RDW: 12.9 % (ref 11.5–15.5)
WBC: 6 10*3/uL (ref 4.0–10.5)
nRBC: 0 % (ref 0.0–0.2)

## 2022-11-03 MED ORDER — PSYLLIUM 95 % PO PACK
1.0000 | PACK | Freq: Every day | ORAL | Status: DC
  Administered 2022-11-03 – 2022-11-04 (×2): 1 via ORAL
  Filled 2022-11-03 (×2): qty 1

## 2022-11-03 NOTE — Progress Notes (Signed)
Occupational Therapy Session Note  Patient Details  Name: Danny [name] MRN: [id number] Date of Birth: 06/05/1951  Today's Date: 11/03/2022 OT Individual Time: 2778-2423 & 5361-4431 OT Individual Time Calculation (min): 45 min & 73 min   Short Term Goals: Week 1:  OT Short Term Goal 1 (Week 1): STGs+LTGs 2/2 LOS  Skilled Therapeutic Interventions/Progress Updates:  Session 1 Skilled OT intervention completed with focus on IADL management and dynamic balance. Pt received upright in bed, agreeable to session. No pain reported.  Threaded pants at bed level for time with min A. Transitioned to EOB with mod I, donned TLSO with independence. Mod I sit > stand and ambulatory transfers throughout session using RW.  Ambulated to laundry room, practiced reaching to place/retrieve towel from washer/drier with education provided on use of reacher to prevent breaking spinal precautions. Cues needed for bending knees vs bending at hips and using same hand on same side during transfer of items.  Ambulated to gym, required seated rest break. Participated in house cleaning task simulated via retrieval of bean bags on mid and floor levels, using reacher with good demo of spinal precautions adherence. Cues needed to avoid twisting when placing bean bag in box behind him. Discussed how this relates to IADLs at home.  Ambulated back to room, with pt discussing how he prefers OPOT vs HHOT due to needed intensity level. Discussed the benefits of both, however in agreement for OPOT and plan to give pt HEP for home to prep for free time once home per pt request.  Pt remained seated in w/c, with nurse in room at direct care handoff, and with all needs in reach at end of session.  Session 2 Skilled OT intervention completed with focus on functional endurance and safety within shower context. Pt received supine in bed, agreeable to session. No pain reported.  Pt agreeable and motivated to try showering with  reclining back shower chair, with pt able to transition to EOB with mod I, did doff pants in standing prior to donning TLSO with reminder to keep brace on prior to standing. Completed all sit > stand and ambulatory transfers with mod I using RW.  Ambulated to shower, with reclining back shower chair backed into shower for accessibility with back of chair reclined to about 45 degrees for pressure relief. Sat on shower chair and was able to achieve comfortable position to tolerate sitting without TLSO for duration of shower for >15 mins. Able to bathe with use of LH sponge all at the seated level with assist only for handling [name] and handing soap items. Discussed that this method is too high level for home considering assist needed and environmental spaces available.   Ambulated to w/c. Able to dress at the mod I level, with set up A only for donning shirt and TLSO due to being in shower however is able to do at mod I level as well. Able to pre-thread brief with supervision/cues, then donn all LB clothing seated in w/c at mod I level.  Ambulated to gym, then seated in w/c, completed the following exercises to promote endurance needed for BUE during ADL management and RW use: (With yellow theraband) 10 reps Horizontal abduction Self-anchored shoulder flexion each arm Self-anchored bicep flexion each arm Self-anchored tricep extension each arm Therapist anchored scapular retraction each arm Alternating chest presses Shoulder external rotation Shoulder extension Shoulder diagonal pulls  Ambulated back to room, to EOB, doffed TLSO, transitioned supine via log roll with mod I. Pt remained  semi-supine, with all needs in reach at end of session.   Therapy Documentation Precautions:  Precautions Precautions: Fall, Back Required Braces or Orthoses: Spinal Brace Spinal Brace: Thoracolumbosacral orthotic, Applied in sitting position Restrictions Weight Bearing Restrictions: No    Therapy/Group:  Individual Therapy  Melvyn Novas, MS, OTR/L  11/03/2022, 3:35 PM

## 2022-11-03 NOTE — Progress Notes (Signed)
Occupational Therapy Discharge Summary  Patient Details  Name: Danny [name] MRN: [id number] Date of Birth: Nov 28, 1951  Date of Discharge from OT service:November 03, 2022  Patient has met 13 of 13 long term goals due to improved activity tolerance, improved balance, ability to compensate for deficits, and improved coordination.  Patient to discharge at overall Modified Independent level.  Patient's care partner is independent to provide the necessary physical assistance at discharge.    Reasons goals not met: N/A  Recommendation:  Patient will benefit from ongoing skilled OT services in outpatient setting to continue to advance functional skills in the area of BADL, iADL, and Reduce care partner burden.  Equipment: Wide 3 in 1 BSC  Reasons for discharge: treatment goals met  Patient/family agrees with progress made and goals achieved: Yes  OT Discharge Precautions/Restrictions  Precautions Precautions: Fall;Back Required Braces or Orthoses: Spinal Brace Spinal Brace: Thoracolumbosacral orthotic;Applied in sitting position Restrictions Weight Bearing Restrictions: No ADL ADL Equipment Provided: Long-handled sponge Eating: Independent Where Assessed-Eating: Wheelchair Grooming: Independent Where Assessed-Grooming: Sitting at sink Upper Body Bathing: Modified independent Where Assessed-Upper Body Bathing: Sitting at sink Lower Body Bathing: Modified independent Where Assessed-Lower Body Bathing: Sitting at sink, Standing at sink Upper Body Dressing: Independent Where Assessed-Upper Body Dressing: Sitting at sink Lower Body Dressing: Modified independent Where Assessed-Lower Body Dressing: Sitting at sink, Standing at sink Toileting: Modified independent Where Assessed-Toileting: Bedside Commode Toilet Transfer: Modified independent Toilet Transfer Method: Counselling psychologist: Extra wide bedside commode Tub/Shower Transfer: Close supervison Electrical engineer Method: Ambulating, Sit pivot Tub/Shower Equipment: Facilities manager: Curator Method: Heritage manager: Radio broadcast assistant, Grab bars Vision Baseline Vision/History: 1 Wears glasses Patient Visual Report: No change from baseline Vision Assessment?: No apparent visual deficits Perception  Perception: Within Functional Limits Praxis Praxis: Intact Cognition Cognition Overall Cognitive Status: Within Functional Limits for tasks assessed Arousal/Alertness: Awake/alert Orientation Level: Person;Place;Situation Person: Oriented Place: Oriented Situation: Oriented Memory: Appears intact Awareness: Appears intact Problem Solving: Appears intact Safety/Judgment: Appears intact Brief Interview for Mental Status (BIMS) Repetition of Three Words (First Attempt): 3 Temporal Orientation: Year: Correct Temporal Orientation: Month: Accurate within 5 days Temporal Orientation: Day: Correct Recall: "Sock": Yes, no cue required Recall: "Blue": Yes, no cue required Recall: "Bed": Yes, no cue required BIMS Summary Score: 15 Sensation Sensation Light Touch: Appears Intact Hot/Cold: Appears Intact Proprioception: Appears Intact Stereognosis: Not tested Coordination Gross Motor Movements are Fluid and Coordinated: Yes Fine Motor Movements are Fluid and Coordinated: Yes Finger Nose Finger Test: WFL's Motor  Motor Motor: Within Functional Limits Motor - Skilled Clinical Observations: slow and cautious movements due to pain Mobility  Bed Mobility Bed Mobility: Rolling Right;Rolling Left;Sit to Supine;Supine to Sit Rolling Right: Independent with assistive device Rolling Left: Independent with assistive device Supine to Sit: Independent with assistive device Sit to Supine: Independent with assistive device Transfers Sit to Stand: Independent with assistive device Stand to Sit: Independent with assistive  device  Trunk/Postural Assessment  Cervical Assessment Cervical Assessment: Within Functional Limits Thoracic Assessment Thoracic Assessment: Exceptions to [facility] (limited by TLSO) Lumbar Assessment Lumbar Assessment: Exceptions to [facility] (limited by TLSO) Postural Control Postural Control: Within Functional Limits  Balance Balance Balance Assessed: Yes Static Sitting Balance Static Sitting - Balance Support: Feet supported;Bilateral upper extremity supported Static Sitting - Level of Assistance: 7: Independent Dynamic Sitting Balance Dynamic Sitting - Balance Support: Feet supported;No upper extremity supported Dynamic Sitting - Level of Assistance: 7:  Independent Static Standing Balance Static Standing - Balance Support: Bilateral upper extremity supported;During functional activity (RW) Static Standing - Level of Assistance: 6: Modified independent (Device/Increase time) Dynamic Standing Balance Dynamic Standing - Balance Support: Bilateral upper extremity supported;During functional activity (RW) Dynamic Standing - Level of Assistance: 6: Modified independent (Device/Increase time) Dynamic Standing - Comments: with transfers and gait Extremity/Trunk Assessment RUE Assessment RUE Assessment: Within Functional Limits LUE Assessment LUE Assessment: Within Functional Limits   Danny [name] E Danny Ozga, MS, OTR/L  11/03/2022, 3:37 PM

## 2022-11-03 NOTE — Discharge Instructions (Signed)
Inpatient Rehab Discharge Instructions  Danny [name] Discharge date and time:  11/04/2022  Activities/Precautions/ Functional Status: Activity: no lifting, driving, or strenuous exercise until cleared by MD Diet: regular diet Wound Care: keep wound clean and dry Functional status:  ___ No restrictions     ___ Walk up steps independently ___ 24/7 supervision/assistance   ___ Walk up steps with assistance __x_ Intermittent supervision/assistance  ___ Bathe/dress independently ___ Walk with walker     ___ Bathe/dress with assistance ___ Walk Independently    ___ Shower independently ___ Walk with assistance    __x_ Shower with assistance __x_ No alcohol     Special Instructions: No driving, alcohol consumption or tobacco use.  Check your blood sugars four times daily and record. Please bring these results and discuss current DM control and medications with your PCP as soon as possible after discharge.  COMMUNITY REFERRALS UPON DISCHARGE:    Home Health:   PT     OT                  Agency: [facility] Phone: [phone]    Medical Equipment/Items Ordered: Hospital bed, Wheelchair, Bedside Commode, Rolling Walker                                                  Agency/Supplier: [employer]     Please allow 7-10 days for the scheduling of your first home health visit.  My questions have been answered and I understand these instructions. I will adhere to these goals and the provided educational materials after my discharge from the hospital.  Patient/Caregiver Signature _______________________________ Date __________  Clinician Signature _______________________________________ Date __________  Please bring this form and your medication list with you to all your follow-up doctor's appointments.
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
## 2022-11-03 NOTE — Progress Notes (Signed)
Inpatient Rehabilitation Discharge Medication Review by a Pharmacist  A complete drug regimen review was completed for this patient to identify any potential clinically significant medication issues.  High Risk Drug Classes Is patient taking? Indication by Medication  Antipsychotic No   Anticoagulant No   Antibiotic No   Opioid Yes oxyIR- acute pain  Antiplatelet No   Hypoglycemics/insulin Yes Metformin, Semaglutide- T2DM  Vasoactive Medication Yes Losartan- HTN  Chemotherapy No   Other Yes Lipitor- HLD Protonix- GERD     Type of Medication Issue Identified Description of Issue Recommendation(s)  Drug Interaction(s) (clinically significant)     Duplicate Therapy     Allergy     No Medication Administration End Date     Incorrect Dose     Additional Drug Therapy Needed     Significant med changes from prior encounter (inform family/care partners about these prior to discharge).    Other       Clinically significant medication issues were identified that warrant physician communication and completion of prescribed/recommended actions by midnight of the next day:  No  Time spent performing this drug regimen review (minutes):  30   Dena Esperanza BS, PharmD, BCPS Clinical Pharmacist 11/03/2022 11:55 AM  Contact: [phone] after 3 PM  "Be curious, not judgmental..." -Debbora Dus

## 2022-11-03 NOTE — Progress Notes (Signed)
Pleased with progress on rehab. Scheduled tylenol 500mg 's given at 2022. Reports 3 BM's, miralax and senna s held. Wearing home CPAP. 2023 A

## 2022-11-03 NOTE — Progress Notes (Signed)
Patient able to placed himself on home CPAP unit for the night

## 2022-11-03 NOTE — Progress Notes (Signed)
Physical Therapy Discharge Summary  Patient Details  Name: Danny [name] MRN: [id number] Date of Birth: 14-May-1951  Date of Discharge from PT service:November 03, 2022  Today's Date: 11/03/2022 PT Individual Time: 1000-1114 PT Individual Time Calculation (min): 74 min   Patient has met 9 of 9 long term goals due to improved activity tolerance, improved balance, improved postural control, increased strength, ability to compensate for deficits, improved awareness, and improved coordination. Patient to discharge at an ambulatory level Modified Independent using RW.  Patient's care partner is independent to provide the necessary physical assistance at discharge. Pt's wife was present for 1 family education session (on 11/8) to observe but did not participate with any hands on training. However, pt will D/C at a mod I level overall and will not require any hands on assistance at this time.   All goals met  Recommendation:  Patient will benefit from ongoing skilled PT services in outpatient setting transitioning to outpatient to continue to advance safe functional mobility, address ongoing impairments in transfers, generalized strengthening and endurance, dynamic standing balance/coordination, gait training, and to minimize fall risk.  Equipment: Hospital bed for pain relief and 20x18 manual WC. Pt purchased RW on his own  Reasons for discharge: treatment goals met  Patient/family agrees with progress made and goals achieved: Yes  Today's Interventions: Received pt sitting in WC with TLSO donned. Pt agreeable to PT treatment and reported pain 2/10 in low back increasing to 7-8/10 with mobility, however pt reported not having to have oxy anymore. Session with emphasis on discharge planning, functional mobility/transfers, generalized strengthening and endurance, dynamic standing balance/coordination, stair navigation, and gait training. Went through sensation, MMT, and pain interference  questionnaire. Discussed walking program at home and encouraged pt to walk at least 4 laps (54f each) twice a day and progressily work towards more. Pt WC delivered to room - set up and adjusted legrests to comfort. Pt performed all transfers with RW and mod I throughout session. Pt ambulated 1457fwith RW and mod I to 4W stairwell. Took seated rest break then pt navigated 11 steps x 2 trials with R handrail and supervision using a lateral stepping technique. Pt then ambulated [id number] RW and mod I - noted increased reliance on UE support with increasing gait distance. Pt performed BLE strengthening on Kinetron at 30 cm/sec for 6 minutes with emphasis on glute/quad strength while therapist created HEP. Educated pt on frequency/duration/technique for the following exercises: -hip flexion 2x20 twice/day -LAQ 2x20  twice/day -standing heel raises 3x15  twice/day -standing marches 2x20  twice/day -standing hip abduction 2x12  twice/day Pt performed WC mobility [id number] BUE and mod I back to room and ambulated back to bed. Pt transferred sit<>supine mod I and concluded session with pt semi-reclined in bed with all needs within reach. Pt made mod I in room.   PT Discharge Precautions/Restrictions Precautions Precautions: Fall;Back Required Braces or Orthoses: Spinal Brace Spinal Brace: Thoracolumbosacral orthotic;Applied in sitting position Restrictions Weight Bearing Restrictions: No Pain Interference Pain Interference Pain Effect on Sleep: 1. Rarely or not at all Pain Interference with Therapy Activities: 1. Rarely or not at all Pain Interference with Day-to-Day Activities: 1. Rarely or not at all Cognition Overall Cognitive Status: Within Functional Limits for tasks assessed Arousal/Alertness: Awake/alert Orientation Level: Oriented X4 Memory: Appears intact Awareness: Appears intact Problem Solving: Appears intact Safety/Judgment: Appears intact Sensation Sensation Light Touch:  Appears Intact Hot/Cold: Appears Intact Proprioception: Appears Intact Stereognosis: Not tested Coordination Gross Motor Movements are  Fluid and Coordinated: Yes Fine Motor Movements are Fluid and Coordinated: Yes Finger Nose Finger Test: WFL's Heel Shin Test: [facility] bilaterally Motor  Motor Motor: Within Functional Limits Motor - Skilled Clinical Observations: slow and cautious movements due to pain  Mobility Bed Mobility Bed Mobility: Rolling Right;Rolling Left;Sit to Supine;Supine to Sit Rolling Right: Independent with assistive device Rolling Left: Independent with assistive device Supine to Sit: Independent with assistive device Sit to Supine: Independent with assistive device Transfers Transfers: Sit to Stand;Stand to Sit;Stand Pivot Transfers Sit to Stand: Independent with assistive device Stand to Sit: Independent with assistive device Stand Pivot Transfers: Independent with assistive device Transfer (Assistive device): Rolling walker Locomotion  Gait Ambulation: Yes Gait Assistance: Independent with assistive device Gait Distance (Feet): 300 Feet Assistive device: Rolling walker Gait Gait: Yes Gait Pattern: Impaired Gait Pattern: Trunk flexed;Decreased stride length;Poor foot clearance - left;Poor foot clearance - right Gait velocity: decreased Stairs / Additional Locomotion Stairs: Yes Stairs Assistance: Supervision/Verbal cueing Stair Management Technique: One rail Right Number of Stairs: 12 Height of Stairs: 6 Ramp: Supervision/Verbal cueing (RW) Curb: Contact Guard/Touching assist Pick up small object from the floor assist level: Dependent - Patient 0% Pick up small object from the floor assistive device: N/A due to back precautions Wheelchair Mobility Wheelchair Mobility: Yes Wheelchair Assistance: Independent with Camera operator: Both upper extremities Wheelchair Parts Management: Needs assistance Distance: 161f  Trunk/Postural  Assessment  Cervical Assessment Cervical Assessment: Within Functional Limits Thoracic Assessment Thoracic Assessment: Exceptions to [facility](limited by TLSO) Lumbar Assessment Lumbar Assessment: Exceptions to [facility] by TLSO) Postural Control Postural Control: Within Functional Limits  Balance Balance Balance Assessed: Yes Static Sitting Balance Static Sitting - Balance Support: Feet supported;Bilateral upper extremity supported Static Sitting - Level of Assistance: 7: Independent Dynamic Sitting Balance Dynamic Sitting - Balance Support: Feet supported;No upper extremity supported Dynamic Sitting - Level of Assistance: 7: Independent Static Standing Balance Static Standing - Balance Support: Bilateral upper extremity supported;During functional activity (RW) Static Standing - Level of Assistance: 6: Modified independent (Device/Increase time) Dynamic Standing Balance Dynamic Standing - Balance Support: Bilateral upper extremity supported;During functional activity (RW) Dynamic Standing - Level of Assistance: 6: Modified independent (Device/Increase time) Dynamic Standing - Comments: with transfers and gait Extremity Assessment  RLE Assessment RLE Assessment: Within Functional Limits General Strength Comments: 5/5 LLE Assessment LLE Assessment: Within Functional Limits General Strength Comments: 5/5  AAlfonse AlpersPT, DPT  11/03/2022, 7:12 AM

## 2022-11-04 MED ORDER — METHOCARBAMOL 500 MG PO TABS: 500.0000 mg | ORAL_TABLET | Freq: Four times a day (QID) | ORAL | 0 refills | Status: DC

## 2022-11-04 MED ORDER — OXYCODONE HCL 5 MG PO TABS: 5.0000 mg | ORAL_TABLET | Freq: Three times a day (TID) | ORAL | 0 refills | Status: DC | PRN

## 2022-11-04 MED ORDER — ACETAMINOPHEN 500 MG PO TABS: 500.0000 mg | ORAL_TABLET | Freq: Three times a day (TID) | ORAL | 0 refills | Status: DC | PRN

## 2022-11-04 MED ORDER — PSYLLIUM 95 % PO PACK: 1.0000 | PACK | Freq: Every day | ORAL | Status: DC

## 2022-11-04 NOTE — Progress Notes (Addendum)
Patient ID: Danny [name], male   DOB: June 08, 1951, 71 y.o.   MRN: [id number]  SW follow up with patient to discuss d/c info and address any questions or concerns. Patient ready for d/c. Patient requesting to change HH to OP with OP at [facility].

## 2022-11-04 NOTE — Progress Notes (Signed)
Patient ID: Danny [name], male   DOB: 1951-01-26, 71 y.o.   MRN: [id number]  SW received follow up from patient spouse, Angelique Blonder. Patient reports he left foot rest for WC here at the hospital and unable to use chair without LR. SW checked room, room already clean. Staff will follow up with housekeeping. No additional questions or concerns.

## 2022-11-04 NOTE — Progress Notes (Addendum)
Inpatient Rehabilitation Care Coordinator Discharge Note   Patient Details  Name: Danny [name] MRN: [id number] Date of Birth: May 15, 1951   Discharge location: Home  Length of Stay: 12 Days  Discharge activity level: MOD i/Sup  Home/community participation: spouse  Patient response ZL:[id number] Literacy - How often do you need to have someone help you when you read instructions, pamphlets, or other written material from your doctor or pharmacy?: Never  Patient response XB:LTJQZE Isolation - How often do you feel lonely or isolated from those around you?: Never  Services provided included: SW, Neuropsych, Pharmacy, TR, RN, CM, SLP, OT, PT, RD, MD  Financial Services:  Financial Services Utilized: Medicare    Choices offered to/list presented to: patient  Follow-up services arranged:  OP AT [facility] PT/OT         Patient response to transportation need: Is the patient able to respond to transportation needs?: Yes In the past 12 months, has lack of transportation kept you from medical appointments or from getting medications?: No In the past 12 months, has lack of transportation kept you from meetings, work, or from getting things needed for daily living?: No    Comments (or additional information):  Patient/Family verbalized understanding of follow-up arrangements:  Yes  Individual responsible for coordination of the follow-up plan: self  Confirmed correct DME delivered: Andria Rhein 11/04/2022    Andria Rhein

## 2022-11-04 NOTE — Progress Notes (Signed)
INPATIENT REHABILITATION DISCHARGE NOTE   Discharge instructions by: Dois Davenport PA  Verbalized understanding:yes  Skin care/Wound care healing? none  Pain:none  IV's:none  Tubes/Drains:none  O2:none  Safety instructions:done  Patient belongings:done  Discharged to: home Discharged OVF:[id number]/CZY  Notes:

## 2022-11-05 ENCOUNTER — Telehealth: Payer: Self-pay
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
## 2022-11-18 ENCOUNTER — Ambulatory Visit (INDEPENDENT_AMBULATORY_CARE_PROVIDER_SITE_OTHER): Payer: Medicare Other | Admitting: Internal Medicine

## 2022-11-18 ENCOUNTER — Encounter: Payer: Self-pay | Admitting: Internal Medicine

## 2022-11-18 VITALS — BP 136/80 | HR 94 | Temp 98.1°F | Ht 75.0 in | Wt 264.0 lb

## 2022-11-18 DIAGNOSIS — S32030D Wedge compression fracture of third lumbar vertebra, subsequent encounter for fracture with routine healing: Secondary | ICD-10-CM

## 2022-11-18 DIAGNOSIS — I10 Essential (primary) hypertension: Secondary | ICD-10-CM | POA: Diagnosis not present

## 2022-11-18 DIAGNOSIS — S32009D Unspecified fracture of unspecified lumbar vertebra, subsequent encounter for fracture with routine healing: Secondary | ICD-10-CM | POA: Diagnosis not present

## 2022-11-18 DIAGNOSIS — S32048B Other fracture of fourth lumbar vertebra, initial encounter for open fracture: Secondary | ICD-10-CM

## 2022-11-18 DIAGNOSIS — E1165 Type 2 diabetes mellitus with hyperglycemia: Secondary | ICD-10-CM

## 2022-11-18 MED ORDER — AMLODIPINE BESYLATE 10 MG PO TABS: 10.0000 mg | ORAL_TABLET | Freq: Every day | ORAL | 3 refills | Status: DC

## 2022-11-18 NOTE — Assessment & Plan Note (Signed)
Lab Results  Component Value Date   HGBA1C 5.8 (H) 10/20/2022   Stable, possible overcontrolled now with wt loss related to ozempic and illness, pt to continue current medical treatment ozempic 1 mg weekly, but ok to d/c metformin

## 2022-11-18 NOTE — Assessment & Plan Note (Signed)
Ok to stay on amlodipine and added back to med list,   BP Readings from Last 3 Encounters:  11/18/22 136/80  11/04/22 135/86  10/23/22 (!) 141/90   Stable, pt to continue medical treatment norvasc 10 mg qd, losartan 100 mg qd
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
## 2022-11-18 NOTE — Patient Instructions (Addendum)
Please schedule the bone density test before leaving today at the scheduling desk (where you check out)  Ok to continue the amlodipine 10 mg per day (It is back on the list)  Ok to stop the Metformin  Please continue all other medications as before, and refills have been done if requested.  Please have the pharmacy call with any other refills you may need.  Please continue your efforts at being more active, low cholesterol diet, and weight control.  You are otherwise up to date with prevention measures today.  Please keep your appointments with your specialists as you may have planned  - Rehab physician, and NS and your PT excercises  No further lab work needed today  Please make an Appointment to return in 6 months, or sooner if needed

## 2022-11-18 NOTE — Assessment & Plan Note (Signed)
Pain now improved, to continue PT, f/u rehab MD and NS, also for DXA

## 2022-11-21 ENCOUNTER — Ambulatory Visit (HOSPITAL_BASED_OUTPATIENT_CLINIC_OR_DEPARTMENT_OTHER): Payer: Medicare Other | Attending: Physician Assistant | Admitting: Physical Therapy

## 2022-11-21 ENCOUNTER — Other Ambulatory Visit: Payer: Self-pay

## 2022-11-21 DIAGNOSIS — Y939 Activity, unspecified: Secondary | ICD-10-CM | POA: Diagnosis not present

## 2022-11-21 DIAGNOSIS — H2513 Age-related nuclear cataract, bilateral: Secondary | ICD-10-CM | POA: Diagnosis not present

## 2022-11-21 DIAGNOSIS — R262 Difficulty in walking, not elsewhere classified: Secondary | ICD-10-CM | POA: Diagnosis not present

## 2022-11-21 DIAGNOSIS — M5459 Other low back pain: Secondary | ICD-10-CM | POA: Insufficient documentation

## 2022-11-21 DIAGNOSIS — H52223 Regular astigmatism, bilateral: Secondary | ICD-10-CM | POA: Diagnosis not present

## 2022-11-21 DIAGNOSIS — H524 Presbyopia: Secondary | ICD-10-CM | POA: Diagnosis not present

## 2022-11-21 DIAGNOSIS — Y929 Unspecified place or not applicable: Secondary | ICD-10-CM | POA: Insufficient documentation

## 2022-11-21 DIAGNOSIS — M6281 Muscle weakness (generalized): Secondary | ICD-10-CM | POA: Diagnosis not present

## 2022-11-21 DIAGNOSIS — S32001A Stable burst fracture of unspecified lumbar vertebra, initial encounter for closed fracture: Secondary | ICD-10-CM | POA: Insufficient documentation

## 2022-11-21 DIAGNOSIS — H5213 Myopia, bilateral: Secondary | ICD-10-CM | POA: Diagnosis not present

## 2022-11-21 DIAGNOSIS — H53143 Visual discomfort, bilateral: Secondary | ICD-10-CM | POA: Diagnosis not present

## 2022-11-21 LAB — HM DIABETES EYE EXAM
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
## 2022-11-22 ENCOUNTER — Encounter (HOSPITAL_BASED_OUTPATIENT_CLINIC_OR_DEPARTMENT_OTHER): Payer: Self-pay | Admitting: Physical Therapy

## 2022-11-24 ENCOUNTER — Encounter (HOSPITAL_BASED_OUTPATIENT_CLINIC_OR_DEPARTMENT_OTHER): Payer: Self-pay | Admitting: Physical Therapy

## 2022-11-24 ENCOUNTER — Ambulatory Visit (HOSPITAL_BASED_OUTPATIENT_CLINIC_OR_DEPARTMENT_OTHER): Payer: Medicare Other | Admitting: Physical Therapy

## 2022-11-24 DIAGNOSIS — R262 Difficulty in walking, not elsewhere classified: Secondary | ICD-10-CM | POA: Diagnosis not present

## 2022-11-24 DIAGNOSIS — M6281 Muscle weakness (generalized): Secondary | ICD-10-CM | POA: Diagnosis not present

## 2022-11-24 DIAGNOSIS — M5459 Other low back pain: Secondary | ICD-10-CM

## 2022-11-24 DIAGNOSIS — S32001S Stable burst fracture of unspecified lumbar vertebra, sequela: Secondary | ICD-10-CM

## 2022-11-24 DIAGNOSIS — S32001A Stable burst fracture of unspecified lumbar vertebra, initial encounter for closed fracture: Secondary | ICD-10-CM | POA: Diagnosis not present
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
## 2022-11-26 ENCOUNTER — Telehealth: Payer: Self-pay | Admitting: Internal Medicine

## 2022-11-26 DIAGNOSIS — M8008XA Age-related osteoporosis with current pathological fracture, vertebra(e), initial encounter for fracture: Secondary | ICD-10-CM | POA: Diagnosis not present

## 2022-11-26 DIAGNOSIS — Z6832 Body mass index (BMI) 32.0-32.9, adult: Secondary | ICD-10-CM | POA: Diagnosis not present

## 2022-11-26 NOTE — Telephone Encounter (Signed)
LVM for pt to rtn my call to schedule AWV with [facility] call back # [phone] 

## 2022-12-01 ENCOUNTER — Ambulatory Visit (HOSPITAL_BASED_OUTPATIENT_CLINIC_OR_DEPARTMENT_OTHER): Payer: Medicare Other | Admitting: Physical Therapy

## 2022-12-01 ENCOUNTER — Encounter (HOSPITAL_BASED_OUTPATIENT_CLINIC_OR_DEPARTMENT_OTHER): Payer: Self-pay | Admitting: Physical Therapy

## 2022-12-01 DIAGNOSIS — M5459 Other low back pain: Secondary | ICD-10-CM

## 2022-12-01 DIAGNOSIS — R262 Difficulty in walking, not elsewhere classified: Secondary | ICD-10-CM | POA: Diagnosis not present

## 2022-12-01 DIAGNOSIS — S32001S Stable burst fracture of unspecified lumbar vertebra, sequela: Secondary | ICD-10-CM

## 2022-12-01 DIAGNOSIS — M6281 Muscle weakness (generalized): Secondary | ICD-10-CM

## 2022-12-01 DIAGNOSIS — S32001A Stable burst fracture of unspecified lumbar vertebra, initial encounter for closed fracture: Secondary | ICD-10-CM | POA: Diagnosis not present
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
## 2022-12-03 ENCOUNTER — Telehealth: Payer: Medicare Other | Admitting: Emergency Medicine

## 2022-12-03 DIAGNOSIS — R6889 Other general symptoms and signs: Secondary | ICD-10-CM

## 2022-12-03 MED ORDER — BENZONATATE 100 MG PO CAPS: 100.0000 mg | ORAL_CAPSULE | Freq: Two times a day (BID) | ORAL | 0 refills | Status: DC | PRN

## 2022-12-03 MED ORDER — OSELTAMIVIR PHOSPHATE 75 MG PO CAPS: 75.0000 mg | ORAL_CAPSULE | Freq: Two times a day (BID) | ORAL | 0 refills | Status: DC

## 2022-12-03 NOTE — Patient Instructions (Signed)
Elvina Sidle, thank you for joining Roxy Horseman, PA-C for today's virtual visit.  While this provider is not your primary care provider (PCP), if your PCP is located in our provider database this encounter information will be shared with them immediately following your visit.   A [facility] MyChart account gives you access to today's visit and all your visits, tests, and labs performed at [facility] " click here if you don't have a [facility] MyChart account or go to [email or web address]  Consent: (Patient) Elvina Sidle provided verbal consent for this virtual visit at the beginning of the encounter.  Current Medications:  Current Outpatient Medications:    benzonatate (TESSALON) 100 MG capsule, Take 1 capsule (100 mg total) by mouth 2 (two) times daily as needed for cough., Disp: 20 capsule, Rfl: 0   oseltamivir (TAMIFLU) 75 MG capsule, Take 1 capsule (75 mg total) by mouth 2 (two) times daily., Disp: 10 capsule, Rfl: 0   acetaminophen (TYLENOL) 500 MG tablet, Take 1 tablet (500 mg total) by mouth every 8 (eight) hours as needed., Disp: 30 tablet, Rfl: 0   amLODipine (NORVASC) 10 MG tablet, Take 1 tablet (10 mg total) by mouth daily., Disp: 90 tablet, Rfl: 3   aspirin 325 MG tablet, Take 325 mg by mouth daily., Disp: , Rfl:    atorvastatin (LIPITOR) 10 MG tablet, TAKE 1 TABLET(10 MG) BY MOUTH DAILY (Patient taking differently: Take 10 mg by mouth daily.), Disp: 90 tablet, Rfl: 3   Coenzyme Q10 (CO Q 10 PO), Take by mouth., Disp: , Rfl:    loratadine (CLARITIN) 10 MG tablet, Take 1 tablet (10 mg total) by mouth as needed., Disp: 90 tablet, Rfl: 1   losartan (COZAAR) 100 MG tablet, TAKE 1 TABLET(100 MG) BY MOUTH DAILY (Patient taking differently: Take 100 mg by mouth daily.), Disp: 90 tablet, Rfl: 1   methocarbamol (ROBAXIN) 500 MG tablet, Take 1 tablet (500 mg total) by mouth 4 (four) times daily., Disp: 120 tablet, Rfl: 0   Omega-3 Fatty Acids (FISH OIL) 500 MG  CAPS, Take by mouth 2 (two) times daily., Disp: , Rfl:    oxyCODONE (OXY IR/ROXICODONE) 5 MG immediate release tablet, Take 1 tablet (5 mg total) by mouth 3 (three) times daily as needed for breakthrough pain. (Patient not taking: Reported on 11/05/2022), Disp: 21 tablet, Rfl: 0   polyethylene glycol (MIRALAX / GLYCOLAX) 17 g packet, Take 17 g by mouth daily. (Patient not taking: Reported on 11/21/2022), Disp: 14 each, Rfl: 0   psyllium (HYDROCIL/METAMUCIL) 95 % PACK, Take 1 packet by mouth daily., Disp: 240 each, Rfl:    Semaglutide, 1 MG/DOSE, 4 MG/3ML SOPN, Inject 1 mg as directed once a week., Disp: 9 mL, Rfl: 1   senna-docusate (SENOKOT-S) 8.6-50 MG tablet, Take 2 tablets by mouth 2 (two) times daily. (Patient not taking: Reported on 11/21/2022), Disp: , Rfl:    silver sulfADIAZINE (SILVADENE) 1 % cream, Apply 1 application topically daily. (Patient not taking: Reported on 11/21/2022), Disp: 50 g, Rfl: 1   Medications ordered in this encounter:  Meds ordered this encounter  Medications   oseltamivir (TAMIFLU) 75 MG capsule    Sig: Take 1 capsule (75 mg total) by mouth 2 (two) times daily.    Dispense:  10 capsule    Refill:  0    Order Specific Question:   Supervising Provider    Answer:   Merrilee Jansky [[id number]]   benzonatate (TESSALON) 100 MG capsule  Sig: Take 1 capsule (100 mg total) by mouth 2 (two) times daily as needed for cough.    Dispense:  20 capsule    Refill:  0    Order Specific Question:   Supervising Provider    Answer:   Merrilee Jansky [id number]     *If you need refills on other medications prior to your next appointment, please contact your pharmacy*  Follow-Up: Call back or seek an in-person evaluation if the symptoms worsen or if the condition fails to improve as anticipated.  [facility] [phone]  Other Instructions    If you have been instructed to have an in-person evaluation today at a local Urgent Care facility, please use the  link below. It will take you to a list of all of our available [facility], including address, phone number and hours of operation. Please do not delay care.  [facility]  If you or a family member do not have a primary care provider, use the link below to schedule a visit and establish care. When you choose a [facility] primary care physician or advanced practice provider, you gain a long-term partner in health. Find a Primary Care Provider  Learn more about [facility]'s in-office and virtual care options: [facility] - Get Care Now
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
## 2022-12-05 ENCOUNTER — Ambulatory Visit (INDEPENDENT_AMBULATORY_CARE_PROVIDER_SITE_OTHER)
Admission: RE | Admit: 2022-12-05 | Discharge: 2022-12-05 | Disposition: A | Discharge status: Home / Self Care | Payer: Medicare Other | Source: Ambulatory Visit | Attending: Internal Medicine | Admitting: Internal Medicine

## 2022-12-05 ENCOUNTER — Other Ambulatory Visit: Payer: Self-pay | Admitting: Physician Assistant

## 2022-12-05 ENCOUNTER — Encounter
Payer: Medicare Other | Attending: Physical Medicine and Rehabilitation | Admitting: Physical Medicine and Rehabilitation

## 2022-12-05 VITALS — BP 117/90 | HR 104 | Ht 75.0 in | Wt 257.4 lb

## 2022-12-05 DIAGNOSIS — S32030D Wedge compression fracture of third lumbar vertebra, subsequent encounter for fracture with routine healing: Secondary | ICD-10-CM

## 2022-12-05 DIAGNOSIS — S32001D Stable burst fracture of unspecified lumbar vertebra, subsequent encounter for fracture with routine healing: Secondary | ICD-10-CM | POA: Insufficient documentation

## 2022-12-05 DIAGNOSIS — I10 Essential (primary) hypertension: Secondary | ICD-10-CM | POA: Diagnosis not present

## 2022-12-05 DIAGNOSIS — S32048B Other fracture of fourth lumbar vertebra, initial encounter for open fracture: Secondary | ICD-10-CM | POA: Diagnosis not present

## 2022-12-05 MED ORDER — LIDOCAINE 5 % EX PTCH: 1.0000 | MEDICATED_PATCH | CUTANEOUS | 0 refills | Status: DC

## 2022-12-05 MED ORDER — VITAMIN D (ERGOCALCIFEROL) 1.25 MG (50000 UNIT) PO CAPS: 50000.0000 [IU] | ORAL_CAPSULE | ORAL | 0 refills | Status: DC

## 2022-12-05 NOTE — Progress Notes (Signed)
Subjective:    Patient ID: Danny [name], male    DOB: [birth year], 71 y.o.   MRN: [id number]  HPI Mr. Lang is a 71 year old man who presents for hospital follow-up after lumbar burst fracture.[name]  1) Lumbar burst fracture -had been walking a lot -he has a path set up for 60 feet around his house and was doing 5 laps and worked up to 21 laps -the more he did it did feel difficult He backed down to 10 labs per day -he stopped taking the oxycodone after he left he hospital.  2) HTN -DBP elevated today  3) Obestiy -BMI is 32.17 -takes semaglutide Pain Inventory Average Pain 5 Pain Right Now 2 My pain is intermittent and stabbing  In the last 24 hours, has pain interfered with the following? General activity 2 Relation with others 0 Enjoyment of life 0 What TIME of day is your pain at its worst? daytime Sleep (in general) Fair  Pain is worse with: walking, sitting, and standing Pain improves with: rest and medication Relief from Meds: 2  walk without assistance use a walker how many minutes can you walk? 5 ability to climb steps?  yes do you drive?  no  retired I need assistance with the following:  bathing  No problems in this area  Hospital f/u  Hospital f/u    Family History  Problem Relation Age of Onset   Heart disease Father        MI-smoker   Cancer Maternal Grandfather        type unknown   Cancer Maternal Grandmother        type unknown   Emphysema Paternal Uncle    Social History   Socioeconomic History   Marital status: Married    Spouse name: Not on file   Number of children: 2   Years of education: Not on file   Highest education level: Not on file  Occupational History   Occupation: Conservation officer, nature: [employer]  Tobacco Use   Smoking status: Never   Smokeless tobacco: Never  Substance and Sexual Activity   Alcohol use: Yes    Alcohol/week: 0.0 standard drinks of alcohol    Comment: 0-2 per day   Drug use: No    Sexual activity: Not on file  Other Topics Concern   Not on file  Social History Narrative   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 He's married 2 daughters. 0-2 alcoholic drinks a day 0-2 caffeinated beverages a day.04/18/2016   Social Determinants of Health   Financial Resource Strain: Low Risk  (12/09/2021)   Overall Financial Resource Strain (CARDIA)    Difficulty of Paying Living Expenses: Not hard at all  Food Insecurity: No Food Insecurity (10/20/2022)   Hunger Vital Sign    Worried About Running Out of Food in the Last Year: Never true    Ran Out of Food in the Last Year: Never true  Transportation Needs: No Transportation Needs (10/20/2022)   PRAPARE - Administrator, Civil Service (Medical): No    Lack of Transportation (Non-Medical): No  Physical Activity: Not on file  Stress: No Stress Concern Present (12/09/2021)   [employer] of Occupational Health - Occupational Stress Questionnaire    Feeling of Stress : Not at all  Social Connections: Unknown (12/09/2021)   Social Connection and Isolation Panel [NHANES]    Frequency of  Communication with Friends and Family: More than three times a week    Frequency of Social Gatherings with Friends and Family: More than three times a week    Attends Religious Services: Patient refused    Active Member of Clubs or Organizations: Patient refused    Attends Engineer, structural: Patient refused    Marital Status: Married   Past Surgical History:  Procedure Laterality Date   COLONOSCOPY     COSMETIC SURGERY     NOSE SURGERY  1970   cosmetic   TONSILLECTOMY     Past Medical History:  Diagnosis Date   Allergic rhinitis, cause unspecified 03/12/2012   Allergy    SEASONAL   Anxiety    Diabetes (HCC) 06/26/2008   Centricity Description: DM Qualifier: Diagnosis of  By: Jonny Ruiz MD, Len Blalock  Centricity Description: DIABETES MELLITUS, TYPE II  Qualifier: Diagnosis of  By: Jonny Ruiz MD, Len Blalock    DIVERTICULOSIS, COLON 07/05/2008   DM w/o Complication Type II 06/26/2008   HYPERLIPIDEMIA 06/27/2008   HYPERTENSION 06/27/2008   Kidney stones    Mini stroke    OBSTRUCTIVE SLEEP APNEA 06/26/2008   Sleep apnea    CPAP   Stroke (HCC)    MINI   BP (!) 117/90   Pulse (!) 104   Ht 6\' 3"  (1.905 m)   Wt 257 lb 6.4 oz (116.8 kg)   SpO2 98%   BMI 32.17 kg/m   Opioid Risk Score:   Fall Risk Score:  `1  Depression screen [facility] 2/9     06/10/2022   11:08 AM 12/09/2021   11:19 AM 06/07/2021   11:12 AM 06/08/2020   11:30 AM 06/15/2019   10:29 AM 04/06/2018    8:00 AM 03/31/2017    9:04 AM  Depression screen PHQ 2/9  Decreased Interest 0 0 0 0 0 0 0  Down, Depressed, Hopeless 0 0 0 0 0 0 0  PHQ - 2 Score 0 0 0 0 0 0 0  Altered sleeping 0     0   Tired, decreased energy 0     0   Change in appetite 0     0   Feeling bad or failure about yourself  0     0   Trouble concentrating 0     0   Moving slowly or fidgety/restless 0     0   Suicidal thoughts 0     0   PHQ-9 Score 0     0       Review of Systems     Objective:   Physical Exam Gen: no distress, normal appearing HEENT: oral mucosa pink and moist, NCAT Cardio: Reg rate Chest: normal effort, normal rate of breathing Abd: soft, non-distended Ext: no edema Psych: pleasant, normal affect Skin: intact Neuro: Alert and oriented. TLSO in place       Assessment & Plan:  Mr. Marney is a 71 year old man who presents for hospital follow-up after lumbar burst fracture  1) Lumbar burst fracture -reviewed PCP and NSGY note -continue TLSO -continue daily walking regimen -continue   2) HTN: -BP is 117/90 today.  -Advised checking BP daily at home and logging results to bring into follow-up appointment with PCP and myself. -Reviewed BP meds today.  -Advised regarding healthy foods that can help lower blood pressure and provided with a list: 1) citrus foods- high in vitamins and  minerals 2) salmon and other fatty fish - reduces inflammation  and oxylipins 3) swiss chard (leafy green)- high level of nitrates 4) pumpkin seeds- one of the best natural sources of magnesium 5) Beans and lentils- high in fiber, magnesium, and potassium 6) Berries- high in flavonoids 7) Amaranth (whole grain, can be cooked similarly to rice and oats)- high in magnesium and fiber 8) Pistachios- even more effective at reducing BP than other nuts 9) Carrots- high in phenolic compounds that relax blood vessels and reduce inflammation 10) Celery- contain phthalides that relax tissues of arterial walls 11) Tomatoes- can also improve cholesterol and reduce risk of heart disease 12) Broccoli- good source of magnesium, calcium, and potassium 13) Greek yogurt: high in potassium and calcium 14) Herbs and spices: Celery seed, cilantro, saffron, lemongrass, black cumin, ginseng, cinnamon, cardamom, sweet basil, and ginger 15) Chia and flax seeds- also help to lower cholesterol and blood sugar 16) Beets- high levels of nitrates that relax blood vessels  17) spinach and bananas- high in potassium  -Provided lise of supplements that can help with hypertension:  1) magnesium: one high quality brand is Bioptemizers since it contains all 7 types of magnesium, otherwise over the counter magnesium gluconate 400mg  is a good option 2) B vitamins 3) vitamin D 4) potassium 5) CoQ10 6) L-arginine 7) Vitamin C 8) Beetroot -Educated that goal BP is 120/80. -Made goal to incorporate some of the above foods into diet.    3) Obesity: -Educated that current weight is 257 lbs and current BMI is 32.17 -continue semalgutide -Educated regarding health benefits of weight loss- for pain, general health, chronic disease prevention, immune health, mental health.  -Will monitor weight every visit.  -Consider Ro[place]ois tea daily.  -Discussed the benefits of intermittent fasting. -Discussed foods that can assist in weight  loss: 1) leafy greens- high in fiber and nutrients 2) dark chocolate- improves metabolism (if prefer sweetened, best to sweeten with honey instead of sugar).  3) cruciferous vegetables- high in fiber and protein 4) full fat yogurt: high in healthy fat, protein, calcium, and probiotics 5) apples- high in a variety of phytochemicals 6) nuts- high in fiber and protein that increase feelings of fullness 7) grapefruit: rich in nutrients, antioxidants, and fiber (not to be taken with anticoagulation) 8) beans- high in protein and fiber 9) salmon- has high quality protein and healthy fats 10) green tea- rich in polyphenols 11) eggs- rich in choline and vitamin D 12) tuna- high protein, boosts metabolism 13) avocado- decreases visceral abdominal fat 14) chicken (pasture raised): high in protein and iron 15) blueberries- reduce abdominal fat and cholesterol 16) whole grains- decreases calories retained during digestion, speeds metabolism 17) chia seeds- curb appetite 18) chilies- increases fat metabolism  -Discussed supplements that can be used:  1) Metatrim 400mg  BID 30 minutes before breakfast and dinner  2) Sphaeranthus indicus and Garcinia mangostana (combinations of these and #1 can be found in capsicum and zychrome  3) green coffee bean extract 400mg  twice per day or Irvingia (african mango) 150 to 300mg  twice per day.

## 2022-12-07 ENCOUNTER — Other Ambulatory Visit: Payer: Self-pay | Admitting: Internal Medicine

## 2022-12-07 DIAGNOSIS — M8000XD Age-related osteoporosis with current pathological fracture, unspecified site, subsequent encounter for fracture with routine healing: Secondary | ICD-10-CM

## 2022-12-09 ENCOUNTER — Telehealth: Payer: Self-pay | Admitting: Internal Medicine

## 2022-12-09 NOTE — Telephone Encounter (Signed)
Ok to change his Vit D - Please take OTC Vitamin D3 at 2000 units per day, indefinitely. Or he could take OTC Oscal Plus D at 1 tab TID with meals to help the absorption of the Calcium part (that way he gets the Calcium and Vit D3 as well)

## 2022-12-09 NOTE — Telephone Encounter (Signed)
Patient is concerned about his osteroporosis - he would like your opinion on what he should be taking or doing to improve this condition.  Vitamin D 40 mcg a day and Vitamin K 80 mcg a day.  Is this enough or should he be taking more. What are your recommendations - Patient is also taking once a week Vitamin D2 50,000 iu once a week.

## 2022-12-09 NOTE — Telephone Encounter (Signed)
Called pt gave him MD response. He states he is in bed could those instructions be email to him at [email or web address]. inform pt will email.Marland KitchenRaechel Chute

## 2022-12-10 ENCOUNTER — Ambulatory Visit: Payer: Medicare Other | Admitting: Internal Medicine

## 2022-12-10 ENCOUNTER — Ambulatory Visit (INDEPENDENT_AMBULATORY_CARE_PROVIDER_SITE_OTHER): Payer: Medicare Other

## 2022-12-10 VITALS — Ht 75.0 in | Wt 257.0 lb

## 2022-12-10 DIAGNOSIS — Z0000 Encounter for general adult medical examination without abnormal findings: Secondary | ICD-10-CM | POA: Diagnosis not present
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
## 2022-12-11 ENCOUNTER — Ambulatory Visit (HOSPITAL_BASED_OUTPATIENT_CLINIC_OR_DEPARTMENT_OTHER): Payer: Medicare Other | Admitting: Physical Therapy

## 2022-12-11 ENCOUNTER — Encounter (HOSPITAL_BASED_OUTPATIENT_CLINIC_OR_DEPARTMENT_OTHER): Payer: Self-pay | Admitting: Physical Therapy

## 2022-12-11 DIAGNOSIS — M5459 Other low back pain: Secondary | ICD-10-CM | POA: Diagnosis not present

## 2022-12-11 DIAGNOSIS — R262 Difficulty in walking, not elsewhere classified: Secondary | ICD-10-CM

## 2022-12-11 DIAGNOSIS — S32001A Stable burst fracture of unspecified lumbar vertebra, initial encounter for closed fracture: Secondary | ICD-10-CM | POA: Diagnosis not present

## 2022-12-11 DIAGNOSIS — M6281 Muscle weakness (generalized): Secondary | ICD-10-CM | POA: Diagnosis not present
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
## 2022-12-17 ENCOUNTER — Other Ambulatory Visit: Payer: Self-pay

## 2022-12-17 MED ORDER — ATORVASTATIN CALCIUM 10 MG PO TABS: 10.0000 mg | ORAL_TABLET | Freq: Every day | ORAL | 3 refills | Status: DC
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
## 2022-12-18 ENCOUNTER — Encounter (HOSPITAL_BASED_OUTPATIENT_CLINIC_OR_DEPARTMENT_OTHER): Payer: Self-pay | Admitting: Physical Therapy

## 2022-12-18 ENCOUNTER — Ambulatory Visit (HOSPITAL_BASED_OUTPATIENT_CLINIC_OR_DEPARTMENT_OTHER): Payer: Medicare Other | Admitting: Physical Therapy

## 2022-12-18 DIAGNOSIS — R262 Difficulty in walking, not elsewhere classified: Secondary | ICD-10-CM

## 2022-12-18 DIAGNOSIS — M6281 Muscle weakness (generalized): Secondary | ICD-10-CM | POA: Diagnosis not present

## 2022-12-18 DIAGNOSIS — M5459 Other low back pain: Secondary | ICD-10-CM

## 2022-12-18 DIAGNOSIS — S32001A Stable burst fracture of unspecified lumbar vertebra, initial encounter for closed fracture: Secondary | ICD-10-CM | POA: Diagnosis not present
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
## 2022-12-23 ENCOUNTER — Encounter (HOSPITAL_BASED_OUTPATIENT_CLINIC_OR_DEPARTMENT_OTHER): Payer: Self-pay | Admitting: Physical Therapy

## 2022-12-23 ENCOUNTER — Ambulatory Visit (HOSPITAL_BASED_OUTPATIENT_CLINIC_OR_DEPARTMENT_OTHER): Payer: Medicare Other | Attending: Physician Assistant | Admitting: Physical Therapy

## 2022-12-23 DIAGNOSIS — M6281 Muscle weakness (generalized): Secondary | ICD-10-CM | POA: Diagnosis not present

## 2022-12-23 DIAGNOSIS — M5459 Other low back pain: Secondary | ICD-10-CM | POA: Diagnosis not present

## 2022-12-23 DIAGNOSIS — R262 Difficulty in walking, not elsewhere classified: Secondary | ICD-10-CM | POA: Diagnosis not present

## 2022-12-25 ENCOUNTER — Encounter (HOSPITAL_BASED_OUTPATIENT_CLINIC_OR_DEPARTMENT_OTHER): Payer: Self-pay | Admitting: Physical Therapy

## 2022-12-25 ENCOUNTER — Ambulatory Visit (HOSPITAL_BASED_OUTPATIENT_CLINIC_OR_DEPARTMENT_OTHER): Payer: Medicare Other | Admitting: Physical Therapy

## 2022-12-25 DIAGNOSIS — R262 Difficulty in walking, not elsewhere classified: Secondary | ICD-10-CM

## 2022-12-25 DIAGNOSIS — M5459 Other low back pain: Secondary | ICD-10-CM | POA: Diagnosis not present

## 2022-12-25 DIAGNOSIS — M6281 Muscle weakness (generalized): Secondary | ICD-10-CM
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
## 2022-12-29 ENCOUNTER — Encounter (HOSPITAL_BASED_OUTPATIENT_CLINIC_OR_DEPARTMENT_OTHER): Payer: Medicare Other | Admitting: Physical Therapy

## 2022-12-30 ENCOUNTER — Ambulatory Visit (HOSPITAL_BASED_OUTPATIENT_CLINIC_OR_DEPARTMENT_OTHER): Payer: Medicare Other | Admitting: Physical Therapy

## 2022-12-30 ENCOUNTER — Encounter (HOSPITAL_BASED_OUTPATIENT_CLINIC_OR_DEPARTMENT_OTHER): Payer: Self-pay | Admitting: Physical Therapy

## 2022-12-30 DIAGNOSIS — M5459 Other low back pain: Secondary | ICD-10-CM | POA: Diagnosis not present

## 2022-12-30 DIAGNOSIS — R262 Difficulty in walking, not elsewhere classified: Secondary | ICD-10-CM | POA: Diagnosis not present

## 2022-12-30 DIAGNOSIS — M6281 Muscle weakness (generalized): Secondary | ICD-10-CM
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
## 2023-01-01 ENCOUNTER — Encounter (HOSPITAL_BASED_OUTPATIENT_CLINIC_OR_DEPARTMENT_OTHER): Payer: Self-pay | Admitting: Physical Therapy

## 2023-01-01 ENCOUNTER — Ambulatory Visit (HOSPITAL_BASED_OUTPATIENT_CLINIC_OR_DEPARTMENT_OTHER): Payer: Medicare Other | Admitting: Physical Therapy

## 2023-01-01 DIAGNOSIS — M5459 Other low back pain: Secondary | ICD-10-CM | POA: Diagnosis not present

## 2023-01-01 DIAGNOSIS — R262 Difficulty in walking, not elsewhere classified: Secondary | ICD-10-CM | POA: Diagnosis not present

## 2023-01-01 DIAGNOSIS — M6281 Muscle weakness (generalized): Secondary | ICD-10-CM
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
## 2023-01-02 DIAGNOSIS — Z6833 Body mass index (BMI) 33.0-33.9, adult: Secondary | ICD-10-CM | POA: Diagnosis not present

## 2023-01-02 DIAGNOSIS — M8008XD Age-related osteoporosis with current pathological fracture, vertebra(e), subsequent encounter for fracture with routine healing: Secondary | ICD-10-CM | POA: Diagnosis not present

## 2023-01-05 ENCOUNTER — Encounter (HOSPITAL_BASED_OUTPATIENT_CLINIC_OR_DEPARTMENT_OTHER): Payer: Medicare Other | Admitting: Physical Therapy

## 2023-01-06 ENCOUNTER — Encounter (HOSPITAL_BASED_OUTPATIENT_CLINIC_OR_DEPARTMENT_OTHER): Payer: Self-pay

## 2023-01-06 ENCOUNTER — Ambulatory Visit (HOSPITAL_BASED_OUTPATIENT_CLINIC_OR_DEPARTMENT_OTHER): Payer: Medicare Other

## 2023-01-06 DIAGNOSIS — M6281 Muscle weakness (generalized): Secondary | ICD-10-CM | POA: Diagnosis not present

## 2023-01-06 DIAGNOSIS — R262 Difficulty in walking, not elsewhere classified: Secondary | ICD-10-CM | POA: Diagnosis not present

## 2023-01-06 DIAGNOSIS — M5459 Other low back pain: Secondary | ICD-10-CM | POA: Diagnosis not present
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
## 2023-01-07 NOTE — Addendum Note (Signed)
Addended by: Marijo Sanes on: 01/07/2023 08:44 AM   Modules accepted: Orders

## 2023-01-08 ENCOUNTER — Encounter (HOSPITAL_BASED_OUTPATIENT_CLINIC_OR_DEPARTMENT_OTHER): Payer: Medicare Other | Admitting: Physical Therapy

## 2023-01-09 ENCOUNTER — Encounter (HOSPITAL_BASED_OUTPATIENT_CLINIC_OR_DEPARTMENT_OTHER): Payer: Self-pay | Admitting: Physical Therapy

## 2023-01-09 ENCOUNTER — Ambulatory Visit (HOSPITAL_BASED_OUTPATIENT_CLINIC_OR_DEPARTMENT_OTHER): Payer: Medicare Other | Admitting: Physical Therapy

## 2023-01-09 DIAGNOSIS — M5459 Other low back pain: Secondary | ICD-10-CM

## 2023-01-09 DIAGNOSIS — M6281 Muscle weakness (generalized): Secondary | ICD-10-CM | POA: Diagnosis not present

## 2023-01-09 DIAGNOSIS — R262 Difficulty in walking, not elsewhere classified: Secondary | ICD-10-CM | POA: Diagnosis not present
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
## 2023-01-12 ENCOUNTER — Encounter (HOSPITAL_BASED_OUTPATIENT_CLINIC_OR_DEPARTMENT_OTHER): Payer: Medicare Other | Admitting: Physical Therapy

## 2023-01-13 ENCOUNTER — Ambulatory Visit (HOSPITAL_BASED_OUTPATIENT_CLINIC_OR_DEPARTMENT_OTHER): Payer: Medicare Other | Admitting: Physical Therapy

## 2023-01-13 ENCOUNTER — Encounter (HOSPITAL_BASED_OUTPATIENT_CLINIC_OR_DEPARTMENT_OTHER): Payer: Self-pay | Admitting: Physical Therapy

## 2023-01-13 DIAGNOSIS — M6281 Muscle weakness (generalized): Secondary | ICD-10-CM

## 2023-01-13 DIAGNOSIS — M5459 Other low back pain: Secondary | ICD-10-CM

## 2023-01-13 DIAGNOSIS — R262 Difficulty in walking, not elsewhere classified: Secondary | ICD-10-CM | POA: Diagnosis not present
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
## 2023-01-14 ENCOUNTER — Telehealth: Payer: Self-pay | Admitting: Internal Medicine

## 2023-01-14 DIAGNOSIS — M810 Age-related osteoporosis without current pathological fracture: Secondary | ICD-10-CM

## 2023-01-14 NOTE — Telephone Encounter (Signed)
Please advise 

## 2023-01-14 NOTE — Telephone Encounter (Signed)
Referral done again  Oscal Plus D at one tab with each meal would be great

## 2023-01-14 NOTE — Telephone Encounter (Signed)
Patient wants to be referred to another endocrinologist - they can not see him for several months.  Patient would also like some clarification on his calcium supplements - he wants to make sure that they do not interact with his other medications.    Patient # [phone]

## 2023-01-15 ENCOUNTER — Ambulatory Visit (HOSPITAL_BASED_OUTPATIENT_CLINIC_OR_DEPARTMENT_OTHER): Payer: Medicare Other | Admitting: Physical Therapy

## 2023-01-15 ENCOUNTER — Encounter: Payer: Self-pay | Admitting: Internal Medicine

## 2023-01-15 ENCOUNTER — Encounter (HOSPITAL_BASED_OUTPATIENT_CLINIC_OR_DEPARTMENT_OTHER): Payer: Self-pay | Admitting: Physical Therapy

## 2023-01-15 DIAGNOSIS — M5459 Other low back pain: Secondary | ICD-10-CM

## 2023-01-15 DIAGNOSIS — M6281 Muscle weakness (generalized): Secondary | ICD-10-CM | POA: Diagnosis not present

## 2023-01-15 DIAGNOSIS — R262 Difficulty in walking, not elsewhere classified: Secondary | ICD-10-CM | POA: Diagnosis not present
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
## 2023-01-15 NOTE — Telephone Encounter (Signed)
Informed patient of referral and discussed calcium supplements. He had more questions but was busy at the time of call and will send mychart message for further concerns

## 2023-01-19 ENCOUNTER — Encounter (HOSPITAL_BASED_OUTPATIENT_CLINIC_OR_DEPARTMENT_OTHER): Payer: Medicare Other | Admitting: Physical Therapy

## 2023-01-20 ENCOUNTER — Encounter (HOSPITAL_BASED_OUTPATIENT_CLINIC_OR_DEPARTMENT_OTHER): Payer: Self-pay | Admitting: Physical Therapy

## 2023-01-20 ENCOUNTER — Ambulatory Visit (HOSPITAL_BASED_OUTPATIENT_CLINIC_OR_DEPARTMENT_OTHER): Payer: Medicare Other | Admitting: Physical Therapy

## 2023-01-20 DIAGNOSIS — R262 Difficulty in walking, not elsewhere classified: Secondary | ICD-10-CM

## 2023-01-20 DIAGNOSIS — M5459 Other low back pain: Secondary | ICD-10-CM

## 2023-01-20 DIAGNOSIS — M6281 Muscle weakness (generalized): Secondary | ICD-10-CM | POA: Diagnosis not present
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
## 2023-01-20 NOTE — Telephone Encounter (Signed)
PCCs to see pt concern 

## 2023-01-22 ENCOUNTER — Ambulatory Visit (HOSPITAL_BASED_OUTPATIENT_CLINIC_OR_DEPARTMENT_OTHER): Payer: Medicare Other | Attending: Physician Assistant | Admitting: Physical Therapy

## 2023-01-22 ENCOUNTER — Encounter (HOSPITAL_BASED_OUTPATIENT_CLINIC_OR_DEPARTMENT_OTHER): Payer: Self-pay | Admitting: Physical Therapy

## 2023-01-22 DIAGNOSIS — R262 Difficulty in walking, not elsewhere classified: Secondary | ICD-10-CM | POA: Diagnosis not present

## 2023-01-22 DIAGNOSIS — M5459 Other low back pain: Secondary | ICD-10-CM | POA: Diagnosis not present

## 2023-01-22 DIAGNOSIS — M6281 Muscle weakness (generalized): Secondary | ICD-10-CM | POA: Diagnosis not present
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
## 2023-01-26 ENCOUNTER — Encounter (HOSPITAL_BASED_OUTPATIENT_CLINIC_OR_DEPARTMENT_OTHER): Payer: Self-pay | Admitting: Physical Therapy

## 2023-01-26 ENCOUNTER — Ambulatory Visit (HOSPITAL_BASED_OUTPATIENT_CLINIC_OR_DEPARTMENT_OTHER): Payer: Medicare Other | Admitting: Physical Therapy

## 2023-01-26 DIAGNOSIS — M6281 Muscle weakness (generalized): Secondary | ICD-10-CM

## 2023-01-26 DIAGNOSIS — M5459 Other low back pain: Secondary | ICD-10-CM

## 2023-01-26 DIAGNOSIS — R262 Difficulty in walking, not elsewhere classified: Secondary | ICD-10-CM

## 2023-01-27 ENCOUNTER — Ambulatory Visit (HOSPITAL_BASED_OUTPATIENT_CLINIC_OR_DEPARTMENT_OTHER): Payer: Medicare Other | Admitting: Physical Therapy

## 2023-01-29 ENCOUNTER — Other Ambulatory Visit: Payer: Self-pay | Admitting: Internal Medicine

## 2023-01-29 ENCOUNTER — Encounter (HOSPITAL_BASED_OUTPATIENT_CLINIC_OR_DEPARTMENT_OTHER): Payer: Self-pay | Admitting: Physical Therapy

## 2023-01-29 ENCOUNTER — Ambulatory Visit (HOSPITAL_BASED_OUTPATIENT_CLINIC_OR_DEPARTMENT_OTHER): Payer: Medicare Other | Admitting: Physical Therapy

## 2023-01-29 DIAGNOSIS — R262 Difficulty in walking, not elsewhere classified: Secondary | ICD-10-CM | POA: Diagnosis not present

## 2023-01-29 DIAGNOSIS — M6281 Muscle weakness (generalized): Secondary | ICD-10-CM | POA: Diagnosis not present

## 2023-01-29 DIAGNOSIS — M5459 Other low back pain: Secondary | ICD-10-CM

## 2023-01-29 NOTE — Telephone Encounter (Signed)
Please refill as per office routine med refill policy (all routine meds to be refilled for 3 mo or monthly (per pt preference) up to one year from last visit, then month to month grace period for 3 mo, then further med refills will have to be denied) ? ?
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
## 2023-02-03 ENCOUNTER — Encounter (HOSPITAL_BASED_OUTPATIENT_CLINIC_OR_DEPARTMENT_OTHER): Payer: Self-pay | Admitting: Physical Therapy

## 2023-02-03 ENCOUNTER — Ambulatory Visit (HOSPITAL_BASED_OUTPATIENT_CLINIC_OR_DEPARTMENT_OTHER): Payer: Medicare Other | Admitting: Physical Therapy

## 2023-02-03 DIAGNOSIS — M5459 Other low back pain: Secondary | ICD-10-CM

## 2023-02-03 DIAGNOSIS — M6281 Muscle weakness (generalized): Secondary | ICD-10-CM | POA: Diagnosis not present

## 2023-02-03 DIAGNOSIS — R262 Difficulty in walking, not elsewhere classified: Secondary | ICD-10-CM | POA: Diagnosis not present
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
## 2023-02-06 ENCOUNTER — Ambulatory Visit (HOSPITAL_BASED_OUTPATIENT_CLINIC_OR_DEPARTMENT_OTHER): Payer: Medicare Other | Admitting: Physical Therapy

## 2023-02-10 ENCOUNTER — Encounter (HOSPITAL_BASED_OUTPATIENT_CLINIC_OR_DEPARTMENT_OTHER): Payer: Self-pay | Admitting: Physical Therapy

## 2023-02-10 ENCOUNTER — Ambulatory Visit (HOSPITAL_BASED_OUTPATIENT_CLINIC_OR_DEPARTMENT_OTHER): Payer: Medicare Other | Admitting: Physical Therapy

## 2023-02-10 DIAGNOSIS — M6281 Muscle weakness (generalized): Secondary | ICD-10-CM | POA: Diagnosis not present

## 2023-02-10 DIAGNOSIS — M5459 Other low back pain: Secondary | ICD-10-CM | POA: Diagnosis not present

## 2023-02-10 DIAGNOSIS — R262 Difficulty in walking, not elsewhere classified: Secondary | ICD-10-CM | POA: Diagnosis not present
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
## 2023-02-11 DIAGNOSIS — M8008XD Age-related osteoporosis with current pathological fracture, vertebra(e), subsequent encounter for fracture with routine healing: Secondary | ICD-10-CM | POA: Diagnosis not present

## 2023-02-12 ENCOUNTER — Ambulatory Visit (HOSPITAL_BASED_OUTPATIENT_CLINIC_OR_DEPARTMENT_OTHER): Payer: Medicare Other | Admitting: Physical Therapy

## 2023-02-12 DIAGNOSIS — M5459 Other low back pain: Secondary | ICD-10-CM | POA: Diagnosis not present

## 2023-02-12 DIAGNOSIS — R262 Difficulty in walking, not elsewhere classified: Secondary | ICD-10-CM | POA: Diagnosis not present

## 2023-02-12 DIAGNOSIS — M6281 Muscle weakness (generalized): Secondary | ICD-10-CM | POA: Diagnosis not present
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
## 2023-02-13 ENCOUNTER — Encounter (HOSPITAL_BASED_OUTPATIENT_CLINIC_OR_DEPARTMENT_OTHER): Payer: Self-pay | Admitting: Physical Therapy

## 2023-02-17 ENCOUNTER — Encounter (HOSPITAL_BASED_OUTPATIENT_CLINIC_OR_DEPARTMENT_OTHER): Payer: Medicare Other | Admitting: Physical Therapy

## 2023-02-19 ENCOUNTER — Encounter (HOSPITAL_BASED_OUTPATIENT_CLINIC_OR_DEPARTMENT_OTHER): Payer: Medicare Other | Admitting: Physical Therapy

## 2023-02-26 ENCOUNTER — Encounter (HOSPITAL_BASED_OUTPATIENT_CLINIC_OR_DEPARTMENT_OTHER): Payer: Medicare Other

## 2023-02-26 ENCOUNTER — Encounter (HOSPITAL_BASED_OUTPATIENT_CLINIC_OR_DEPARTMENT_OTHER): Payer: Medicare Other | Admitting: Physical Therapy

## 2023-03-05 ENCOUNTER — Encounter (HOSPITAL_BASED_OUTPATIENT_CLINIC_OR_DEPARTMENT_OTHER): Payer: Medicare Other

## 2023-03-09 ENCOUNTER — Encounter (HOSPITAL_BASED_OUTPATIENT_CLINIC_OR_DEPARTMENT_OTHER): Payer: Medicare Other | Admitting: Physical Therapy

## 2023-03-12 ENCOUNTER — Encounter (HOSPITAL_BASED_OUTPATIENT_CLINIC_OR_DEPARTMENT_OTHER): Payer: Medicare Other | Admitting: Physical Therapy

## 2023-03-16 ENCOUNTER — Encounter (HOSPITAL_BASED_OUTPATIENT_CLINIC_OR_DEPARTMENT_OTHER): Payer: Medicare Other

## 2023-03-19 ENCOUNTER — Encounter (HOSPITAL_BASED_OUTPATIENT_CLINIC_OR_DEPARTMENT_OTHER): Payer: Medicare Other | Admitting: Physical Therapy

## 2023-05-14 ENCOUNTER — Telehealth: Payer: Self-pay | Admitting: Internal Medicine

## 2023-05-14 DIAGNOSIS — E559 Vitamin D deficiency, unspecified: Secondary | ICD-10-CM

## 2023-05-14 DIAGNOSIS — E538 Deficiency of other specified B group vitamins: Secondary | ICD-10-CM

## 2023-05-14 DIAGNOSIS — N320 Bladder-neck obstruction: Secondary | ICD-10-CM

## 2023-05-14 DIAGNOSIS — E1165 Type 2 diabetes mellitus with hyperglycemia: Secondary | ICD-10-CM

## 2023-05-14 NOTE — Telephone Encounter (Signed)
Notified pt MD entered labs. Pt states he will go to [facility] on tomorrow.Marland KitchenRaechel Chute

## 2023-05-14 NOTE — Telephone Encounter (Signed)
Ok labs ordered 

## 2023-05-14 NOTE — Telephone Encounter (Signed)
Patient would like his labs ordered today so he can get his labs done before his appointment on Tuesday.  Please call patient and advise  [phone]

## 2023-05-14 NOTE — Telephone Encounter (Signed)
No orders in epic.[name] Pt has medicare...Raechel Chute

## 2023-05-15 ENCOUNTER — Other Ambulatory Visit (INDEPENDENT_AMBULATORY_CARE_PROVIDER_SITE_OTHER): Payer: Medicare Other

## 2023-05-15 DIAGNOSIS — N320 Bladder-neck obstruction: Secondary | ICD-10-CM

## 2023-05-15 DIAGNOSIS — E559 Vitamin D deficiency, unspecified: Secondary | ICD-10-CM | POA: Diagnosis not present

## 2023-05-15 DIAGNOSIS — E538 Deficiency of other specified B group vitamins: Secondary | ICD-10-CM | POA: Diagnosis not present

## 2023-05-15 DIAGNOSIS — E1165 Type 2 diabetes mellitus with hyperglycemia: Secondary | ICD-10-CM

## 2023-05-15 LAB — URINALYSIS, ROUTINE W REFLEX MICROSCOPIC
Bilirubin Urine: NEGATIVE
Hgb urine dipstick: NEGATIVE
Ketones, ur: NEGATIVE
Nitrite: NEGATIVE
Specific Gravity, Urine: 1.015 (ref 1.000–1.030)
Total Protein, Urine: NEGATIVE
Urine Glucose: NEGATIVE
Urobilinogen, UA: 0.2 (ref 0.0–1.0)
pH: 7 (ref 5.0–8.0)

## 2023-05-15 LAB — MICROALBUMIN / CREATININE URINE RATIO
Creatinine,U: 188.7 mg/dL
Microalb Creat Ratio: 0.6 mg/g (ref 0.0–30.0)
Microalb, Ur: 1.1 mg/dL (ref 0.0–1.9)

## 2023-05-15 LAB — CBC WITH DIFFERENTIAL/PLATELET
Basophils Absolute: 0 10*3/uL (ref 0.0–0.1)
Basophils Relative: 0.7 % (ref 0.0–3.0)
Eosinophils Absolute: 0.4 10*3/uL (ref 0.0–0.7)
Eosinophils Relative: 8 % — ABNORMAL HIGH (ref 0.0–5.0)
HCT: 43.8 % (ref 39.0–52.0)
Hemoglobin: 14.4 g/dL (ref 13.0–17.0)
Lymphocytes Relative: 26.2 % (ref 12.0–46.0)
Lymphs Abs: 1.3 10*3/uL (ref 0.7–4.0)
MCHC: 32.9 g/dL (ref 30.0–36.0)
MCV: 87.1 fl (ref 78.0–100.0)
Monocytes Absolute: 0.4 10*3/uL (ref 0.1–1.0)
Monocytes Relative: 8.8 % (ref 3.0–12.0)
Neutro Abs: 2.9 10*3/uL (ref 1.4–7.7)
Neutrophils Relative %: 56.3 % (ref 43.0–77.0)
Platelets: 181 10*3/uL (ref 150.0–400.0)
RBC: 5.03 Mil/uL (ref 4.22–5.81)
RDW: 13.2 % (ref 11.5–15.5)
WBC: 5.1 10*3/uL (ref 4.0–10.5)

## 2023-05-15 LAB — LIPID PANEL
Cholesterol: 99 mg/dL (ref 0–200)
HDL: 39.6 mg/dL (ref >39.00)
LDL Cholesterol: 49 mg/dL (ref 0–99)
NonHDL: 59.29
Total CHOL/HDL Ratio: 2
Triglycerides: 53 mg/dL (ref 0.0–149.0)
VLDL: 10.6 mg/dL (ref 0.0–40.0)

## 2023-05-15 LAB — HEPATIC FUNCTION PANEL
ALT: 18 U/L (ref 0–53)
AST: 19 U/L (ref 0–37)
Albumin: 4.3 g/dL (ref 3.5–5.2)
Alkaline Phosphatase: 53 U/L (ref 39–117)
Bilirubin, Direct: 0.2 mg/dL (ref 0.0–0.3)
Total Bilirubin: 0.7 mg/dL (ref 0.2–1.2)
Total Protein: 6.5 g/dL (ref 6.0–8.3)

## 2023-05-15 LAB — BASIC METABOLIC PANEL
BUN: 15 mg/dL (ref 6–23)
CO2: 27 mEq/L (ref 19–32)
Calcium: 9 mg/dL (ref 8.4–10.5)
Chloride: 106 mEq/L (ref 96–112)
Creatinine, Ser: 0.99 mg/dL (ref 0.40–1.50)
GFR: 77.58 mL/min (ref >60.00)
Glucose, Bld: 106 mg/dL — ABNORMAL HIGH (ref 70–99)
Potassium: 5 mEq/L (ref 3.5–5.1)
Sodium: 142 mEq/L (ref 135–145)

## 2023-05-15 LAB — HEMOGLOBIN A1C: Hgb A1c MFr Bld: 5.9 % (ref 4.6–6.5)

## 2023-05-15 LAB — TSH: TSH: 2.3 u[IU]/mL (ref 0.35–5.50)

## 2023-05-15 LAB — PSA: PSA: 1.18 ng/mL (ref 0.10–4.00)

## 2023-05-15 LAB — VITAMIN B12: Vitamin B-12: 439 pg/mL (ref 211–911)

## 2023-05-15 LAB — VITAMIN D 25 HYDROXY (VIT D DEFICIENCY, FRACTURES): VITD: 32.6 ng/mL (ref 30.00–100.00)

## 2023-05-19 ENCOUNTER — Encounter: Payer: Self-pay | Admitting: Internal Medicine

## 2023-05-19 ENCOUNTER — Ambulatory Visit (INDEPENDENT_AMBULATORY_CARE_PROVIDER_SITE_OTHER): Payer: Medicare Other | Admitting: Internal Medicine

## 2023-05-19 VITALS — BP 124/72 | HR 85 | Temp 98.1°F | Ht 75.0 in | Wt 261.0 lb

## 2023-05-19 DIAGNOSIS — E7800 Pure hypercholesterolemia, unspecified: Secondary | ICD-10-CM

## 2023-05-19 DIAGNOSIS — E1165 Type 2 diabetes mellitus with hyperglycemia: Secondary | ICD-10-CM

## 2023-05-19 DIAGNOSIS — M810 Age-related osteoporosis without current pathological fracture: Secondary | ICD-10-CM

## 2023-05-19 DIAGNOSIS — S32009D Unspecified fracture of unspecified lumbar vertebra, subsequent encounter for fracture with routine healing: Secondary | ICD-10-CM

## 2023-05-19 DIAGNOSIS — I700 Atherosclerosis of aorta: Secondary | ICD-10-CM

## 2023-05-19 DIAGNOSIS — E559 Vitamin D deficiency, unspecified: Secondary | ICD-10-CM

## 2023-05-19 DIAGNOSIS — Z7985 Long-term (current) use of injectable non-insulin antidiabetic drugs: Secondary | ICD-10-CM

## 2023-05-19 DIAGNOSIS — I10 Essential (primary) hypertension: Secondary | ICD-10-CM | POA: Diagnosis not present

## 2023-05-19 MED ORDER — SEMAGLUTIDE (2 MG/DOSE) 8 MG/3ML ~~LOC~~ SOPN
2.0000 mg | PEN_INJECTOR | SUBCUTANEOUS | 3 refills | Status: DC
Start: 2023-05-19 — End: 2024-04-29
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
## 2023-05-19 NOTE — Patient Instructions (Addendum)
Please take OTC Vitamin D3 at 2000 units per day, indefinitely ( or slightly more as you mentioned)  Ok to increase the Ozempic to 2 mg weekly  Please continue all other medications as before, and refills have been done if requested.  Please have the pharmacy call with any other refills you may need.  Please continue your efforts at being more active, low cholesterol diet, and weight control..  Please keep your appointments with your specialists as you may have planned - endocrinology next month  Please make an Appointment to return in 6 months, or sooner if needed, also with Lab Appointment for testing done 3-5 days before at the FIRST FLOOR Lab (so this is for TWO appointments - please see the scheduling desk as you leave)

## 2023-05-20 ENCOUNTER — Encounter: Payer: Self-pay | Admitting: Internal Medicine

## 2023-05-20 DIAGNOSIS — E559 Vitamin D deficiency, unspecified: Secondary | ICD-10-CM | POA: Insufficient documentation

## 2023-05-20 DIAGNOSIS — M810 Age-related osteoporosis without current pathological fracture: Secondary | ICD-10-CM | POA: Insufficient documentation

## 2023-05-20 NOTE — Assessment & Plan Note (Addendum)
S/p recent fx in the setting of osteoporosis with improved pain - declines miacalcin - for endo next month as planned

## 2023-05-20 NOTE — Assessment & Plan Note (Signed)
[place] tx for now, to f/u endo as planned

## 2023-05-20 NOTE — Assessment & Plan Note (Signed)
Pt to continue to work on low chol diet, exercise, lipitor 10 mg qd

## 2023-05-20 NOTE — Assessment & Plan Note (Signed)
Last vitamin D Lab Results  Component Value Date   VD25OH 32.60 05/15/2023   Low, to start oral replacement

## 2023-05-20 NOTE — Assessment & Plan Note (Signed)
Lab Results  Component Value Date   LDLCALC 49 05/15/2023   Stable, pt to continue current statin lipitor 10 mg qd

## 2023-05-20 NOTE — Assessment & Plan Note (Signed)
BP Readings from Last 3 Encounters:  05/19/23 124/72  12/05/22 (!) 117/90  11/18/22 136/80   Stable, pt to continue medical treatment norvasc 10 mg qd, losartan 100 qd

## 2023-05-20 NOTE — Assessment & Plan Note (Signed)
Lab Results  Component Value Date   HGBA1C 5.9 05/15/2023   With uncontrolled obesity, pt to increase ozempic 2 mg weekly

## 2023-06-05 ENCOUNTER — Ambulatory Visit (INDEPENDENT_AMBULATORY_CARE_PROVIDER_SITE_OTHER): Payer: Medicare Other | Admitting: "Endocrinology

## 2023-06-05 ENCOUNTER — Telehealth: Payer: Self-pay

## 2023-06-05 ENCOUNTER — Encounter: Payer: Self-pay | Admitting: "Endocrinology

## 2023-06-05 VITALS — BP 120/80 | HR 86 | Ht 75.0 in | Wt 263.6 lb

## 2023-06-05 DIAGNOSIS — M810 Age-related osteoporosis without current pathological fracture: Secondary | ICD-10-CM

## 2023-06-05 MED ORDER — ALENDRONATE SODIUM 70 MG PO TABS: 70.0000 mg | ORAL_TABLET | ORAL | 3 refills | Status: DC

## 2023-06-05 NOTE — Telephone Encounter (Signed)
Danny [name] called stating that he wants to do the Fosamax by IV instead and also asking how much caffeine should he be taking in daily? He states he does drink tea and coffee durning the day and he is asking how much should his intake daily be?

## 2023-06-05 NOTE — Progress Notes (Signed)
[facility] Note Danny Hutchinson, MD    Referring Provider: Corwin Levins, MD Primary Care Provider: Corwin Levins, MD Chief Complaint  Patient presents with   Osteoporosis     Assessment & Plan  British was seen today for osteoporosis.  Diagnoses and all orders for this visit:  Age-related osteoporosis without current pathological fracture  Other orders -     alendronate (FOSAMAX) 70 MG tablet; Take 1 tablet (70 mg total) by mouth every 7 (seven) days. Take with a full glass of water on an empty stomach.     1.Osteoporosis Likely age-related.  BMD results suggest: -2.5 distal radius in 11/2022. Recommend to use calcium 600 mg twice daily and vitamin D 2000 units OTC supplements as well as weight bearing exercise options and dietary supplements. Has long discussion of pros/cons of treatment and treatment modalities.   Start Fosamax 70 mg qam once weekly. Educated on risks and side effects of Fosamax including but not limited to esophagitis, worsening GERD, atypical femoral fractures and osteonecrosis of the jaw. Advised to take medication first thing in the morning with plenty of water and stay upright for 30 minutes after taking the medication.  Advised fall precautions, adequate dairy in diet and exercises (aerobic, balancing and weight bearing) as tolerated.   Return in about 3 months (around 09/05/2023).  I have reviewed current medications, nurse's notes, allergies, vital signs, past medical and surgical history, family medical history, and social history for this encounter. Counseled patient on symptoms, examination findings, lab findings, imaging results, treatment decisions and monitoring and prognosis. The patient understood the recommendations and agrees with the treatment plan. All questions regarding treatment plan were fully answered.   Danny [place], MD   06/05/23    History of Present Illness Danny [name] is a 72 y.o. year old male who  presents to our clinic with osteoporosis diagnosed in 11/2022.  BMD completed on 11/2022  suggested osteoporosis. He is currently taking Calcium 1200 mg a day and vitamin D 4200 international units daily.  Risk Factors screening:  History of low trauma fractures: No Family history of osteoporosis: No Hip fracture in first-degree relatives: No Smoking history: No Excessive alcohol intake >2 drinks/day: No Excessive caffeine intake >2 drinks/day: Yes Glucocorticoid use >5mg  prednisone/day for >3 months: No Rheumatoid arthritis history: No  Results:11/2022 DXA   Lumbar spine L3-L4 Femoral neck (FN) Ultra distal radius  T-score   -1.8 RFN: -1.7 LFN: -1.3 -2.5    Physical Exam  BP 120/80   Pulse 86   Ht 6\' 3"  (1.905 m)   Wt 263 lb 9.6 oz (119.6 kg)   SpO2 98%   BMI 32.95 kg/m  Constitutional: well developed, well nourished Head: normocephalic, atraumatic Eyes: sclera anicteric, no redness Neck: supple Lungs: normal respiratory effort Neurology: alert and oriented Skin: dry, no appreciable rashes Musculoskeletal: no appreciable defects Psychiatric: normal mood and affect  Allergies Allergies  Allergen Reactions   Penicillins Other (See Comments)    He is not sure what the reaction    Current Medications Patient's Medications  New Prescriptions   ALENDRONATE (FOSAMAX) 70 MG TABLET    Take 1 tablet (70 mg total) by mouth every 7 (seven) days. Take with a full glass of water on an empty stomach.  Previous Medications   AMLODIPINE (NORVASC) 10 MG TABLET    Take 1 tablet (10 mg total) by mouth daily.   ASPIRIN 325 MG TABLET    Take 325 mg by  mouth daily.   ATORVASTATIN (LIPITOR) 10 MG TABLET    TAKE 1 TABLET(10 MG) BY MOUTH DAILY   CALCITONIN, SALMON, (MIACALCIN/FORTICAL) 200 UNIT/ACT NASAL SPRAY    SMARTSIG:Both Nares   CALCIUM CARBONATE (OSCAL) 1500 (600 CA) MG TABS TABLET    Take by mouth 2 (two) times daily with a meal.   COENZYME Q10 (CO Q 10 PO)    Take by mouth.    LIDOCAINE (LIDODERM) 5 %    Place 1 patch onto the skin daily. Remove & Discard patch within 12 hours or as directed by MD   LORATADINE (CLARITIN) 10 MG TABLET    Take 1 tablet (10 mg total) by mouth as needed.   LOSARTAN (COZAAR) 100 MG TABLET    TAKE 1 TABLET(100 MG) BY MOUTH DAILY   MAGNESIUM (MAGTAB) 84 MG ( ) TBCR SR TABLET    Take 84 mg by mouth.   OMEGA-3 FATTY ACIDS (FISH OIL) 500 MG CAPS    Take by mouth 2 (two) times daily.   PSYLLIUM (HYDROCIL/METAMUCIL) 95 % PACK    Take 1 packet by mouth daily.   SEMAGLUTIDE, 2 MG/DOSE, 8 MG/3ML SOPN    Inject 2 mg as directed once a week.   SILVER SULFADIAZINE (SILVADENE) 1 % CREAM    Apply 1 application topically daily.   VITAMIN K 100 MCG TABLET    Take 100 mcg by mouth daily.  Modified Medications   No medications on file  Discontinued Medications   No medications on file     Past Medical History Past Medical History:  Diagnosis Date   Allergic rhinitis, cause unspecified 03/12/2012   Allergy    SEASONAL   Anxiety    Diabetes (HCC) 06/26/2008   Centricity Description: DM Qualifier: Diagnosis of  By: Jonny Ruiz MD, Len Blalock  Centricity Description: DIABETES MELLITUS, TYPE II Qualifier: Diagnosis of  By: Jonny Ruiz MD, Len Blalock    DIVERTICULOSIS, COLON 07/05/2008   DM w/o Complication Type II 06/26/2008   HYPERLIPIDEMIA 06/27/2008   HYPERTENSION 06/27/2008   Kidney stones    Mini stroke    OBSTRUCTIVE SLEEP APNEA 06/26/2008   Sleep apnea    CPAP   Stroke [facility])    MINI    Past Surgical History Past Surgical History:  Procedure Laterality Date   COLONOSCOPY     COSMETIC SURGERY     NOSE SURGERY  1970   cosmetic   TONSILLECTOMY      Family History family history includes Cancer in his maternal grandfather and maternal grandmother; Emphysema in his paternal uncle; Heart disease in his father.  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 He's married 2 daughters. 0-2 alcoholic drinks a day 0-2 caffeinated beverages a day.04/18/2016  Social Determinants of Health   Financial Resource Strain: Low Risk  (12/10/2022)   Overall Financial Resource Strain (CARDIA)    Difficulty of Paying Living Expenses: Not hard at all  Food Insecurity: No Food Insecurity (12/10/2022)   Hunger Vital Sign    Worried About Running Out of Food in the Last Year: Never true    Ran Out of Food in the Last Year: Never true  Transportation Needs: No Transportation Needs (12/10/2022)   PRAPARE - Administrator, Civil Service (Medical): No    Lack of Transportation (Non-Medical): No  Physical Activity: Inactive (12/10/2022)   Exercise Vital Sign    Days of Exercise per Week: 0 days    Minutes of Exercise per Session: 0 min  Stress: No Stress Concern Present (12/10/2022)   [employer] of Occupational Health - Occupational Stress Questionnaire    Feeling of Stress : Not at all  Social Connections: Unknown (12/10/2022)   Social Connection and Isolation Panel [NHANES]    Frequency of Communication with Friends and Family: More than three times a week    Frequency of Social Gatherings with Friends and Family: More than three times a week    Attends Religious Services: Patient declined    Database administrator or Organizations: Patient declined    Attends Banker Meetings: Patient  declined    Marital Status: Married  Catering manager Violence: Not At Risk (12/10/2022)   Humiliation, Afraid, Rape, and Kick questionnaire    Fear of Current or Ex-Partner: No    Emotionally Abused: No    Physically Abused: No    Sexually Abused: No    Laboratory Investigations No components found for: "CMP" No components found for: "BMP" Lab Results  Component Value Date   GFR 77.58 05/15/2023   Lab Results  Component Value Date   CREATININE 0.99 05/15/2023   Component     Latest Ref Rng 05/15/2023     Calcium     8.4 - 10.5 mg/dL 9.0   VITD     16.10 - 100.00 ng/mL 32.60         TSH     0.35 - 5.50 uIU/mL 2.30      Lab Results  Component Value Date   TSH 2.30 05/15/2023    Parts of this note may have been dictated using voice recognition software. There may be variances in spelling and vocabulary which are unintentional. Not all errors are proofread. Please notify the Thereasa Parkin if any discrepancies are noted or if the meaning of any statement is not clear.

## 2023-06-08 NOTE — Telephone Encounter (Signed)
I have left Danny [name] a voicemail and I will get with [name] on Tuesday on how to order the IV Reclast

## 2023-06-09 ENCOUNTER — Other Ambulatory Visit: Payer: Self-pay | Admitting: "Endocrinology

## 2023-06-09 NOTE — Progress Notes (Signed)
Ordered Reclast

## 2023-06-10 ENCOUNTER — Telehealth: Payer: Self-pay | Admitting: Pharmacy Technician

## 2023-06-10 NOTE — Telephone Encounter (Signed)
Auth Submission: NO AUTH NEEDED Site of care: Site of care: [facility] Payer: Medicare B/bcbs Fed Medication & CPT/J Code(s) submitted: Reclast (Zolendronic acid) [id number]  Units/visits requested: 5mg  x1  Approval from: 06/10/23 to 12/22/23   Patient will be scheduled asap.

## 2023-06-16 ENCOUNTER — Ambulatory Visit (INDEPENDENT_AMBULATORY_CARE_PROVIDER_SITE_OTHER): Payer: Medicare Other

## 2023-06-16 VITALS — BP 137/83 | HR 67 | Temp 97.6°F | Resp 20 | Ht 75.0 in | Wt 265.0 lb

## 2023-06-16 DIAGNOSIS — M810 Age-related osteoporosis without current pathological fracture: Secondary | ICD-10-CM | POA: Diagnosis not present

## 2023-06-16 MED ORDER — ZOLEDRONIC ACID 5 MG/100ML IV SOLN
5.0000 mg | Freq: Once | INTRAVENOUS | Status: AC
  Administered 2023-06-16: 5 mg via INTRAVENOUS
  Filled 2023-06-16: qty 100

## 2023-06-16 MED ORDER — SODIUM CHLORIDE 0.9 % IV SOLN: INTRAVENOUS | Status: DC

## 2023-06-16 MED ORDER — ACETAMINOPHEN 325 MG PO TABS
650.0000 mg | ORAL_TABLET | Freq: Once | ORAL | Status: AC
  Administered 2023-06-16: 650 mg via ORAL
  Filled 2023-06-16: qty 2

## 2023-06-16 MED ORDER — DIPHENHYDRAMINE HCL 25 MG PO CAPS
25.0000 mg | ORAL_CAPSULE | Freq: Once | ORAL | Status: AC
  Administered 2023-06-16: 25 mg via ORAL
  Filled 2023-06-16: qty 1

## 2023-06-16 NOTE — Progress Notes (Signed)
Diagnosis: Osteoporosis  Provider:  Chilton Greathouse MD  Procedure: IV Infusion  IV Type: Peripheral, IV Location: R Antecubital  Reclast (Zolendronic Acid), Dose: 5 mg  Infusion Start Time: 1424  Infusion Stop Time: 1453  Post Infusion IV Care: Observation period completed and Peripheral IV Discontinued  Discharge: Condition: Good, Destination: Home . AVS Provided  Performed by:  Loney Hering, LPN

## 2023-07-21 ENCOUNTER — Other Ambulatory Visit: Payer: Self-pay

## 2023-07-21 ENCOUNTER — Other Ambulatory Visit: Payer: Self-pay | Admitting: Internal Medicine

## 2023-09-14 ENCOUNTER — Ambulatory Visit (INDEPENDENT_AMBULATORY_CARE_PROVIDER_SITE_OTHER): Payer: Medicare Other | Admitting: "Endocrinology

## 2023-09-14 ENCOUNTER — Encounter: Payer: Self-pay | Admitting: "Endocrinology

## 2023-09-14 ENCOUNTER — Other Ambulatory Visit: Payer: Self-pay | Admitting: Internal Medicine

## 2023-09-14 VITALS — BP 120/73 | HR 95 | Ht 75.0 in | Wt 259.8 lb

## 2023-09-14 DIAGNOSIS — M810 Age-related osteoporosis without current pathological fracture: Secondary | ICD-10-CM

## 2023-09-14 LAB — RENAL FUNCTION PANEL
Albumin: 4.5 g/dL (ref 3.5–5.2)
BUN: 16 mg/dL (ref 6–23)
CO2: 26 mEq/L (ref 19–32)
Calcium: 9.4 mg/dL (ref 8.4–10.5)
Chloride: 105 mEq/L (ref 96–112)
Creatinine, Ser: 1.1 mg/dL (ref 0.40–1.50)
GFR: 68.2 mL/min (ref >60.00)
Glucose, Bld: 135 mg/dL — ABNORMAL HIGH (ref 70–99)
Phosphorus: 3.5 mg/dL (ref 2.3–4.6)
Potassium: 4 mEq/L (ref 3.5–5.1)
Sodium: 142 mEq/L (ref 135–145)

## 2023-09-14 LAB — VITAMIN D 25 HYDROXY (VIT D DEFICIENCY, FRACTURES): VITD: 46.98 ng/mL (ref 30.00–100.00)

## 2023-09-14 NOTE — Progress Notes (Signed)
[facility] Note Danny Fluvanna, MD    Referring Provider: Corwin Levins, MD Primary Care Provider: Corwin Levins, MD No chief complaint on file.    Assessment & Plan  Diagnoses and all orders for this visit:  Age-related osteoporosis without current pathological fracture -     Renal function panel; Future -     VITAMIN D 25 Hydroxy (Vit-D Deficiency, Fractures); Future     Likely age-related.  BMD results suggest: -2.5 distal radius in 11/2022. On calcium 600 mg twice daily and vitamin D 800 units BID OTC supplements as well as weight bearing exercise options and dietary supplements. Has long discussion of pros/cons of treatment and treatment modalities.   Patient never started fosamax, preferred and changed to reclast s/p first infusion on 06/16/23. Patient recalls having some side effects for 3 days post reclast, doesn't recall details.  Advised fall precautions, adequate dairy in diet and exercises (aerobic, balancing and weight bearing) as tolerated.   I have reviewed current medications, nurse's notes, allergies, vital signs, past medical and surgical history, family medical history, and social history for this encounter. Counseled patient on symptoms, examination findings, lab findings, imaging results, treatment decisions and monitoring and prognosis. The patient understood the recommendations and agrees with the treatment plan. All questions regarding treatment plan were fully answered.   Return in about 4 months (around 01/26/2024) for labs today.   Danny Bruno, MD   09/14/23    History of Present Illness Danny [name] is a 72 y.o. year old male who presents to follow up for osteoporosis diagnosed in 11/2022.  BMD completed on 11/2022  suggested osteoporosis. He is currently taking Calcium 600 mg twice a  day and vitamin D 800 international units twice daily.  Denies any falls/fractures. Back to exercise.   Risk Factors screening:  History of low  trauma fractures: No Family history of osteoporosis: No Hip fracture in first-degree relatives: No Smoking history: No Excessive alcohol intake >2 drinks/day: No Excessive caffeine intake >2 drinks/day: Yes Glucocorticoid use >5mg  prednisone/day for >3 months: No Rheumatoid arthritis history: No  Results:11/2022 DXA   Lumbar spine L3-L4 Femoral neck (FN) Ultra distal radius  T-score   -1.8 RFN: -1.7 LFN: -1.3 -2.5    Physical Exam  BP 120/73   Pulse 95   Ht 6\' 3"  (1.905 m)   Wt 259 lb 12.8 oz (117.8 kg)   SpO2 97%   BMI 32.47 kg/m  Constitutional: well developed, well nourished Head: normocephalic, atraumatic Eyes: sclera anicteric, no redness Neck: supple Lungs: normal respiratory effort Neurology: alert and oriented Skin: dry, no appreciable rashes Musculoskeletal: no appreciable defects Psychiatric: normal mood and affect  Allergies Allergies  Allergen Reactions   Penicillins Other (See Comments)    He is not sure what the reaction    Current Medications Patient's Medications  New Prescriptions   No medications on file  Previous Medications   ALENDRONATE (FOSAMAX) 70 MG TABLET    Take 1 tablet (70 mg total) by mouth every 7 (seven) days. Take with a full glass of water on an empty stomach.   AMLODIPINE (NORVASC) 10 MG TABLET    Take 1 tablet (10 mg total) by mouth daily.   ASPIRIN 325 MG TABLET    Take 325 mg by mouth daily.   ATORVASTATIN (LIPITOR) 10 MG TABLET    TAKE 1 TABLET(10 MG) BY MOUTH DAILY   CALCITONIN, SALMON, (MIACALCIN/FORTICAL) 200 UNIT/ACT NASAL SPRAY    SMARTSIG:Both Nares  CALCIUM CARBONATE (OSCAL) 1500 (600 CA) MG TABS TABLET    Take by mouth 2 (two) times daily with a meal.   COENZYME Q10 (CO Q 10 PO)    Take by mouth.   LIDOCAINE (LIDODERM) 5 %    Place 1 patch onto the skin daily. Remove & Discard patch within 12 hours or as directed by MD   LORATADINE (CLARITIN) 10 MG TABLET    Take 1 tablet (10 mg total) by mouth as needed.   LOSARTAN  (COZAAR) 100 MG TABLET    TAKE 1 TABLET(100 MG) BY MOUTH DAILY   MAGNESIUM (MAGTAB) 84 MG ( ) TBCR SR TABLET    Take 84 mg by mouth.   OMEGA-3 FATTY ACIDS (FISH OIL) 500 MG CAPS    Take by mouth 2 (two) times daily.   PSYLLIUM (HYDROCIL/METAMUCIL) 95 % PACK    Take 1 packet by mouth daily.   SEMAGLUTIDE, 2 MG/DOSE, 8 MG/3ML SOPN    Inject 2 mg as directed once a week.   SILVER SULFADIAZINE (SILVADENE) 1 % CREAM    Apply 1 application topically daily.   VITAMIN K 100 MCG TABLET    Take 100 mcg by mouth daily.  Modified Medications   No medications on file  Discontinued Medications   No medications on file     Past Medical History Past Medical History:  Diagnosis Date   Allergic rhinitis, cause unspecified 03/12/2012   Allergy    SEASONAL   Anxiety    Diabetes (HCC) 06/26/2008   Centricity Description: DM Qualifier: Diagnosis of  By: Jonny Ruiz MD, Len Blalock  Centricity Description: DIABETES MELLITUS, TYPE II Qualifier: Diagnosis of  By: Jonny Ruiz MD, Len Blalock    DIVERTICULOSIS, COLON 07/05/2008   DM w/o Complication Type II 06/26/2008   HYPERLIPIDEMIA 06/27/2008   HYPERTENSION 06/27/2008   Kidney stones    Mini stroke    OBSTRUCTIVE SLEEP APNEA 06/26/2008   Sleep apnea    CPAP   Stroke [facility])    MINI    Past Surgical History Past Surgical History:  Procedure Laterality Date   COLONOSCOPY     COSMETIC SURGERY     NOSE SURGERY  1970   cosmetic   TONSILLECTOMY      Family History family history includes Cancer in his maternal grandfather and maternal grandmother; Emphysema in his paternal uncle; Heart disease in his father.  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 He's married 2 daughters. 0-2 alcoholic drinks a day 0-2 caffeinated beverages a day.04/18/2016   Social Determinants of Health   Financial Resource Strain: Low Risk  (12/10/2022)   Overall Financial Resource Strain (CARDIA)    Difficulty of Paying Living Expenses: Not hard at all  Food Insecurity: No Food Insecurity (12/10/2022)   Hunger Vital Sign    Worried About Running Out of Food in the Last Year: Never true    Ran Out of Food in the Last Year: Never true  Transportation Needs: No Transportation Needs (12/10/2022)   PRAPARE - Administrator, Civil Service (Medical): No    Lack of Transportation (Non-Medical): No  Physical Activity: Inactive (12/10/2022)   Exercise Vital Sign    Days of Exercise per Week: 0 days    Minutes of Exercise per Session: 0 min  Stress: No Stress Concern Present (12/10/2022)   [employer] of Occupational Health - Occupational Stress Questionnaire    Feeling of Stress : Not at all  Social Connections: Unknown (12/10/2022)   Social Connection and Isolation Panel [NHANES]    Frequency of Communication with Friends and Family: More than three times a week    Frequency of Social Gatherings with Friends and Family: More than three times a week    Attends Religious Services: Patient declined    Database administrator or Organizations: Patient declined    Attends Banker Meetings: Patient declined    Marital Status: Married  Catering manager Violence: Not At Risk (12/10/2022)   Humiliation, Afraid, Rape, and Kick questionnaire    Fear of Current or Ex-Partner: No    Emotionally Abused: No    Physically  Abused: No    Sexually Abused: No    Laboratory Investigations No components found for: "CMP" No components found for: "BMP" Lab Results  Component Value Date   GFR 77.58 05/15/2023   Lab Results  Component Value Date   CREATININE 0.99 05/15/2023   Component     Latest Ref Rng 05/15/2023     Calcium     8.4 - 10.5 mg/dL 9.0   VITD     16.10 - 100.00 ng/mL 32.60         TSH     0.35 - 5.50 uIU/mL 2.30     Lab Results  Component Value Date   TSH 2.30 05/15/2023    Parts of this note may have been dictated using voice recognition software. There may be variances in spelling and vocabulary which are unintentional. Not all errors are proofread. Please notify the Thereasa Parkin if any discrepancies are noted or if the meaning of any statement is not clear.

## 2023-09-15 ENCOUNTER — Other Ambulatory Visit: Payer: Self-pay | Admitting: Internal Medicine

## 2023-09-15 ENCOUNTER — Other Ambulatory Visit: Payer: Self-pay

## 2023-09-15 LAB — PTH, INTACT AND CALCIUM
Calcium: 9.4 mg/dL (ref 8.6–10.3)
PTH: 27 pg/mL (ref 16–77)

## 2023-09-16 ENCOUNTER — Other Ambulatory Visit: Payer: Self-pay

## 2023-09-16 ENCOUNTER — Other Ambulatory Visit: Payer: Self-pay | Admitting: Internal Medicine

## 2023-10-14 ENCOUNTER — Ambulatory Visit: Payer: Medicare Other | Admitting: Internal Medicine

## 2023-10-20 ENCOUNTER — Encounter: Payer: Self-pay | Admitting: Internal Medicine

## 2023-11-24 ENCOUNTER — Ambulatory Visit: Payer: Medicare Other | Admitting: Internal Medicine

## 2023-11-25 ENCOUNTER — Encounter: Payer: Self-pay | Admitting: Internal Medicine

## 2023-11-25 DIAGNOSIS — E559 Vitamin D deficiency, unspecified: Secondary | ICD-10-CM

## 2023-11-25 DIAGNOSIS — N320 Bladder-neck obstruction: Secondary | ICD-10-CM

## 2023-11-25 DIAGNOSIS — E538 Deficiency of other specified B group vitamins: Secondary | ICD-10-CM

## 2023-11-25 DIAGNOSIS — E1165 Type 2 diabetes mellitus with hyperglycemia: Secondary | ICD-10-CM

## 2023-11-30 ENCOUNTER — Other Ambulatory Visit (INDEPENDENT_AMBULATORY_CARE_PROVIDER_SITE_OTHER): Payer: Medicare Other

## 2023-11-30 DIAGNOSIS — E559 Vitamin D deficiency, unspecified: Secondary | ICD-10-CM | POA: Diagnosis not present

## 2023-11-30 DIAGNOSIS — N320 Bladder-neck obstruction: Secondary | ICD-10-CM

## 2023-11-30 DIAGNOSIS — E538 Deficiency of other specified B group vitamins: Secondary | ICD-10-CM

## 2023-11-30 DIAGNOSIS — E1165 Type 2 diabetes mellitus with hyperglycemia: Secondary | ICD-10-CM | POA: Diagnosis not present

## 2023-11-30 LAB — BASIC METABOLIC PANEL
BUN: 15 mg/dL (ref 6–23)
CO2: 30 meq/L (ref 19–32)
Calcium: 9.3 mg/dL (ref 8.4–10.5)
Chloride: 103 meq/L (ref 96–112)
Creatinine, Ser: 0.99 mg/dL (ref 0.40–1.50)
GFR: 77.28 mL/min (ref >60.00)
Glucose, Bld: 107 mg/dL — ABNORMAL HIGH (ref 70–99)
Potassium: 4.8 meq/L (ref 3.5–5.1)
Sodium: 139 meq/L (ref 135–145)

## 2023-11-30 LAB — CBC WITH DIFFERENTIAL/PLATELET
Basophils Absolute: 0 10*3/uL (ref 0.0–0.1)
Basophils Relative: 0.6 % (ref 0.0–3.0)
Eosinophils Absolute: 0.4 10*3/uL (ref 0.0–0.7)
Eosinophils Relative: 7.5 % — ABNORMAL HIGH (ref 0.0–5.0)
HCT: 44.6 % (ref 39.0–52.0)
Hemoglobin: 14.9 g/dL (ref 13.0–17.0)
Lymphocytes Relative: 24.9 % (ref 12.0–46.0)
Lymphs Abs: 1.3 10*3/uL (ref 0.7–4.0)
MCHC: 33.4 g/dL (ref 30.0–36.0)
MCV: 89 fL (ref 78.0–100.0)
Monocytes Absolute: 0.4 10*3/uL (ref 0.1–1.0)
Monocytes Relative: 8.4 % (ref 3.0–12.0)
Neutro Abs: 3 10*3/uL (ref 1.4–7.7)
Neutrophils Relative %: 58.6 % (ref 43.0–77.0)
Platelets: 174 10*3/uL (ref 150.0–400.0)
RBC: 5.01 Mil/uL (ref 4.22–5.81)
RDW: 13.7 % (ref 11.5–15.5)
WBC: 5.1 10*3/uL (ref 4.0–10.5)

## 2023-11-30 LAB — LIPID PANEL
Cholesterol: 121 mg/dL (ref 0–200)
HDL: 39 mg/dL — ABNORMAL LOW (ref >39.00)
LDL Cholesterol: 65 mg/dL (ref 0–99)
NonHDL: 82.34
Total CHOL/HDL Ratio: 3
Triglycerides: 89 mg/dL (ref 0.0–149.0)
VLDL: 17.8 mg/dL (ref 0.0–40.0)

## 2023-11-30 LAB — HEPATIC FUNCTION PANEL
ALT: 23 U/L (ref 0–53)
AST: 22 U/L (ref 0–37)
Albumin: 4.6 g/dL (ref 3.5–5.2)
Alkaline Phosphatase: 40 U/L (ref 39–117)
Bilirubin, Direct: 0.1 mg/dL (ref 0.0–0.3)
Total Bilirubin: 0.7 mg/dL (ref 0.2–1.2)
Total Protein: 6.8 g/dL (ref 6.0–8.3)

## 2023-11-30 LAB — MICROALBUMIN / CREATININE URINE RATIO
Creatinine,U: 186.6 mg/dL
Microalb Creat Ratio: 0.5 mg/g (ref 0.0–30.0)
Microalb, Ur: 0.9 mg/dL (ref 0.0–1.9)

## 2023-11-30 LAB — TSH: TSH: 2.26 u[IU]/mL (ref 0.35–5.50)

## 2023-11-30 LAB — URINALYSIS, ROUTINE W REFLEX MICROSCOPIC
Bilirubin Urine: NEGATIVE
Hgb urine dipstick: NEGATIVE
Ketones, ur: NEGATIVE
Nitrite: NEGATIVE
Specific Gravity, Urine: 1.02 (ref 1.000–1.030)
Total Protein, Urine: NEGATIVE
Urine Glucose: NEGATIVE
Urobilinogen, UA: 0.2 (ref 0.0–1.0)
pH: 6 (ref 5.0–8.0)

## 2023-11-30 LAB — VITAMIN D 25 HYDROXY (VIT D DEFICIENCY, FRACTURES): VITD: 37.3 ng/mL (ref 30.00–100.00)

## 2023-11-30 LAB — VITAMIN B12: Vitamin B-12: 452 pg/mL (ref 211–911)

## 2023-11-30 LAB — HEMOGLOBIN A1C: Hgb A1c MFr Bld: 6 % (ref 4.6–6.5)

## 2023-11-30 LAB — PSA: PSA: 2.3 ng/mL (ref 0.10–4.00)

## 2023-12-01 ENCOUNTER — Ambulatory Visit (INDEPENDENT_AMBULATORY_CARE_PROVIDER_SITE_OTHER): Payer: Medicare Other | Admitting: Internal Medicine

## 2023-12-01 VITALS — BP 138/88 | HR 82 | Temp 97.7°F | Ht 75.0 in | Wt 264.0 lb

## 2023-12-01 DIAGNOSIS — I10 Essential (primary) hypertension: Secondary | ICD-10-CM | POA: Diagnosis not present

## 2023-12-01 DIAGNOSIS — S32009D Unspecified fracture of unspecified lumbar vertebra, subsequent encounter for fracture with routine healing: Secondary | ICD-10-CM

## 2023-12-01 DIAGNOSIS — R972 Elevated prostate specific antigen [PSA]: Secondary | ICD-10-CM | POA: Insufficient documentation

## 2023-12-01 DIAGNOSIS — S32020G Wedge compression fracture of second lumbar vertebra, subsequent encounter for fracture with delayed healing: Secondary | ICD-10-CM

## 2023-12-01 DIAGNOSIS — E1165 Type 2 diabetes mellitus with hyperglycemia: Secondary | ICD-10-CM | POA: Diagnosis not present

## 2023-12-01 DIAGNOSIS — Z7985 Long-term (current) use of injectable non-insulin antidiabetic drugs: Secondary | ICD-10-CM

## 2023-12-01 DIAGNOSIS — E559 Vitamin D deficiency, unspecified: Secondary | ICD-10-CM | POA: Diagnosis not present

## 2023-12-01 NOTE — Assessment & Plan Note (Signed)
Lab Results  Component Value Date   PSA 2.30 11/30/2023   PSA 1.18 05/15/2023   PSA 1.31 06/09/2022   Mild increased recently, for f/u psa at 6 mo

## 2023-12-01 NOTE — Patient Instructions (Addendum)
Please consider Prolia with your Endocrinologist (or here if you like)  Please continue all other medications as before, and refills have been done if requested.  Please have the pharmacy call with any other refills you may need.  Please continue your efforts at being more active, low cholesterol diet, and weight control.  You are otherwise up to date with prevention measures today.  Please keep your appointments with your specialists as you may have planned  You will be contacted regarding the referral for: MRI for the lumbar spine  Please make an Appointment to return in 6 months, or sooner if needed, also with Lab Appointment for testing done 3-5 days before at the FIRST FLOOR Lab (so this is for TWO appointments - please see the scheduling desk as you leave)

## 2023-12-01 NOTE — Assessment & Plan Note (Signed)
With worsening pain and weakness, for f/u LS spine MRI

## 2023-12-01 NOTE — Progress Notes (Signed)
Patient ID: Danny [name], male   DOB: [birth year], 73 y.o.   MRN: [id number]        Chief Complaint: follow up worsening right lower back pain with right leg weakness, low vit d, increased psa velocity, dm, htn       HPI:  Danny [name] is a 72 y.o. male here with c/o > 6 wks worsening right lower back pain with right hip and leg weakness to standing up with pain, occurs frequently, no falls but difficult to stand.  Can walk up to 4 miles per day.  Denies urinary symptoms such as dysuria, frequency, urgency, flank pain, hematuria or n/v, fever, chills.  Pt denies chest pain, increased sob or doe, wheezing, orthopnea, PND, increased LE swelling, palpitations, dizziness or syncope.   Pt denies polydipsia, polyuria, or new focal neuro s/s.          Wt Readings from Last 3 Encounters:  12/01/23 264 lb (119.7 kg)  09/14/23 259 lb 12.8 oz (117.8 kg)  06/16/23 265 lb (120.2 kg)   BP Readings from Last 3 Encounters:  12/01/23 138/88  09/14/23 120/73  06/16/23 137/83         Past Medical History:  Diagnosis Date   Allergic rhinitis, cause unspecified 03/12/2012   Allergy    SEASONAL   Anxiety    Diabetes (HCC) 06/26/2008   Centricity Description: DM Qualifier: Diagnosis of  By: Jonny Ruiz MD, Len Blalock  Centricity Description: DIABETES MELLITUS, TYPE II Qualifier: Diagnosis of  By: Jonny Ruiz MD, Len Blalock    DIVERTICULOSIS, COLON 07/05/2008   DM w/o Complication Type II 06/26/2008   HYPERLIPIDEMIA 06/27/2008   HYPERTENSION 06/27/2008   Kidney stones    Mini stroke    OBSTRUCTIVE SLEEP APNEA 06/26/2008   Sleep apnea    CPAP   Stroke [facility])    MINI   Past Surgical History:  Procedure Laterality Date   COLONOSCOPY     COSMETIC SURGERY     NOSE SURGERY  1970   cosmetic   TONSILLECTOMY      reports that he has never smoked. He has never used smokeless tobacco. He reports current alcohol use. He reports that he does not use drugs. family history includes Cancer in his maternal grandfather and maternal  grandmother; Emphysema in his paternal uncle; Heart disease in his father. Allergies  Allergen Reactions   Penicillins Other (See Comments)    He is not sure what the reaction   Current Outpatient Medications on File Prior to Visit  Medication Sig Dispense Refill   amLODipine (NORVASC) 10 MG tablet Take 1 tablet (10 mg total) by mouth daily. 90 tablet 3   aspirin 325 MG tablet Take 325 mg by mouth daily.     atorvastatin (LIPITOR) 10 MG tablet TAKE 1 TABLET(10 MG) BY MOUTH DAILY 90 tablet 1   Calcium Carb-Cholecalciferol (CALTRATE 600+D3) 600-20 MG-MCG TABS      Coenzyme Q10 (CO Q 10 PO) Take by mouth.     loratadine (CLARITIN) 10 MG tablet Take 1 tablet (10 mg total) by mouth as needed. 90 tablet 1   losartan (COZAAR) 100 MG tablet TAKE 1 TABLET(100 MG) BY MOUTH DAILY 90 tablet 3   magnesium (MAGTAB) 84 MG ( ) TBCR SR tablet Take 84 mg by mouth.     Omega-3 Fatty Acids (FISH OIL) 500 MG CAPS Take by mouth 2 (two) times daily.     Semaglutide, 2 MG/DOSE, 8 MG/3ML SOPN Inject 2 mg as directed once a  week. 9 mL 3   silver sulfADIAZINE (SILVADENE) 1 % cream Apply 1 application topically daily. 50 g 1   vitamin k 100 MCG tablet Take 100 mcg by mouth daily.     No current facility-administered medications on file prior to visit.        ROS:  All others reviewed and negative.  Objective        PE:  BP 138/88 (BP Location: Left Arm, Patient Position: Sitting, Cuff Size: Normal)   Pulse 82   Temp 97.7 F (36.5 C) (Oral)   Ht 6\' 3"  (1.905 m)   Wt 264 lb (119.7 kg)   SpO2 97%   BMI 33.00 kg/m                 Constitutional: Pt appears in NAD               HENT: Head: NCAT.                Right Ear: External ear normal.                 Left Ear: External ear normal.                Eyes: . Pupils are equal, round, and reactive to light. Conjunctivae and EOM are normal               Nose: without d/c or deformity               Neck: Neck supple. Gross normal ROM                Cardiovascular: Normal rate and regular rhythm.                 Pulmonary/Chest: Effort normal and breath sounds without rales or wheezing.                Abd:  Soft, NT, ND, + BS, no organomegaly               Neurological: Pt is alert. At baseline orientation, motor with 4/5 RLE ,motor weakness               Skin: Skin is warm. No rashes, no other new lesions, LE edema - none               Psychiatric: Pt behavior is normal without agitation   Micro: none  Cardiac tracings I have personally interpreted today:  none  Pertinent Radiological findings (summarize): none   Lab Results  Component Value Date   WBC 5.1 11/30/2023   HGB 14.9 11/30/2023   HCT 44.6 11/30/2023   PLT 174.0 11/30/2023   GLUCOSE 107 (H) 11/30/2023   CHOL 121 11/30/2023   TRIG 89.0 11/30/2023   HDL 39.00 (L) 11/30/2023   LDLCALC 65 11/30/2023   ALT 23 11/30/2023   AST 22 11/30/2023   NA 139 11/30/2023   K 4.8 11/30/2023   CL 103 11/30/2023   CREATININE 0.99 11/30/2023   BUN 15 11/30/2023   CO2 30 11/30/2023   TSH 2.26 11/30/2023   PSA 2.30 11/30/2023   HGBA1C 6.0 11/30/2023   MICROALBUR 0.9 11/30/2023   Assessment/Plan:  Danny [name] is a 72 y.o. White or Caucasian [1] male with  has a past medical history of Allergic rhinitis, cause unspecified (03/12/2012), Allergy, Anxiety, Diabetes (HCC) (06/26/2008), DIVERTICULOSIS, COLON (07/05/2008), DM w/o Complication Type II (06/26/2008), HYPERLIPIDEMIA (06/27/2008), HYPERTENSION (06/27/2008), Kidney stones, Mini stroke, OBSTRUCTIVE  SLEEP APNEA (06/26/2008), Sleep apnea, and Stroke (HCC).  Diabetes (HCC) Lab Results  Component Value Date   HGBA1C 6.0 11/30/2023   Stable, pt to continue current medical treatment ozemipc 2 mg weekly   Hypertension, uncontrolled BP Readings from Last 3 Encounters:  12/01/23 138/88  09/14/23 120/73  06/16/23 137/83   Stable, pt to continue medical treatment norvasc 10 every day, losartan 100 every day,    Vitamin D  deficiency Last vitamin D Lab Results  Component Value Date   VD25OH 37.30 11/30/2023   Low, to start oral replacement   Closed fracture of lumbar spine without spinal cord lesion (HCC) With worsening pain and weakness, for f/u LS spine MRI  Increased prostate specific antigen (PSA) velocity Lab Results  Component Value Date   PSA 2.30 11/30/2023   PSA 1.18 05/15/2023   PSA 1.31 06/09/2022   Mild increased recently, for f/u psa at 6 mo  Followup: Return in about 6 months (around 05/31/2024).  [name], MD 12/01/2023 6:52 PM [facility]

## 2023-12-01 NOTE — Assessment & Plan Note (Signed)
BP Readings from Last 3 Encounters:  12/01/23 138/88  09/14/23 120/73  06/16/23 137/83   Stable, pt to continue medical treatment norvasc 10 every day, losartan 100 every day,

## 2023-12-01 NOTE — Assessment & Plan Note (Signed)
Last vitamin D Lab Results  Component Value Date   VD25OH 37.30 11/30/2023   Low, to start oral replacement

## 2023-12-01 NOTE — Assessment & Plan Note (Signed)
Lab Results  Component Value Date   HGBA1C 6.0 11/30/2023   Stable, pt to continue current medical treatment ozemipc 2 mg weekly

## 2023-12-02 DIAGNOSIS — H524 Presbyopia: Secondary | ICD-10-CM | POA: Diagnosis not present

## 2023-12-02 DIAGNOSIS — H5213 Myopia, bilateral: Secondary | ICD-10-CM | POA: Diagnosis not present

## 2023-12-02 DIAGNOSIS — H35033 Hypertensive retinopathy, bilateral: Secondary | ICD-10-CM | POA: Diagnosis not present

## 2023-12-02 DIAGNOSIS — H2513 Age-related nuclear cataract, bilateral: Secondary | ICD-10-CM | POA: Diagnosis not present

## 2023-12-02 DIAGNOSIS — I10 Essential (primary) hypertension: Secondary | ICD-10-CM | POA: Diagnosis not present

## 2023-12-02 DIAGNOSIS — H52223 Regular astigmatism, bilateral: Secondary | ICD-10-CM | POA: Diagnosis not present

## 2023-12-02 DIAGNOSIS — H53143 Visual discomfort, bilateral: Secondary | ICD-10-CM | POA: Diagnosis not present

## 2023-12-02 DIAGNOSIS — E119 Type 2 diabetes mellitus without complications: Secondary | ICD-10-CM | POA: Diagnosis not present

## 2023-12-02 LAB — HM DIABETES EYE EXAM

## 2023-12-18 ENCOUNTER — Ambulatory Visit
Admission: RE | Admit: 2023-12-18 | Discharge: 2023-12-18 | Disposition: A | Discharge status: Home / Self Care | Payer: Medicare Other | Source: Ambulatory Visit | Attending: Internal Medicine | Admitting: Internal Medicine

## 2023-12-18 DIAGNOSIS — M47816 Spondylosis without myelopathy or radiculopathy, lumbar region: Secondary | ICD-10-CM | POA: Diagnosis not present

## 2023-12-18 DIAGNOSIS — S32020G Wedge compression fracture of second lumbar vertebra, subsequent encounter for fracture with delayed healing: Secondary | ICD-10-CM

## 2023-12-26 ENCOUNTER — Encounter: Payer: Self-pay | Admitting: Internal Medicine

## 2023-12-31 ENCOUNTER — Encounter: Payer: Self-pay | Admitting: Internal Medicine

## 2023-12-31 ENCOUNTER — Other Ambulatory Visit: Payer: Self-pay | Admitting: Internal Medicine

## 2023-12-31 ENCOUNTER — Ambulatory Visit (INDEPENDENT_AMBULATORY_CARE_PROVIDER_SITE_OTHER): Payer: Medicare Other

## 2023-12-31 ENCOUNTER — Encounter: Payer: Self-pay | Admitting: "Endocrinology

## 2023-12-31 VITALS — Ht 75.0 in | Wt 264.0 lb

## 2023-12-31 DIAGNOSIS — Z0000 Encounter for general adult medical examination without abnormal findings: Secondary | ICD-10-CM

## 2023-12-31 DIAGNOSIS — M5416 Radiculopathy, lumbar region: Secondary | ICD-10-CM

## 2023-12-31 DIAGNOSIS — S32009D Unspecified fracture of unspecified lumbar vertebra, subsequent encounter for fracture with routine healing: Secondary | ICD-10-CM

## 2023-12-31 NOTE — Progress Notes (Signed)
 Subjective:   Danny [name] is a 73 y.o. male who presents for Medicare Annual/Subsequent preventive examination.  Visit Complete: Virtual I connected with  [name] on 12/31/23 by a audio enabled telemedicine application and verified that I am speaking with the correct person using two identifiers.  Patient Location: Home  Provider Location: Office/Clinic  I discussed the limitations of evaluation and management by telemedicine. The patient expressed understanding and agreed to proceed.  Vital Signs: Because this visit was a virtual/telehealth visit, some criteria may be missing or patient reported. Any vitals not documented were not able to be obtained and vitals that have been documented are patient reported.    Cardiac Risk Factors include: hypertension;male gender;dyslipidemia;advanced age (>35men, >25 women)     Objective:    Today's Vitals   12/31/23 1315  Weight: 264 lb (119.7 kg)  Height: 6' 3 (1.905 m)   Body mass index is 33 kg/m.     12/31/2023    1:48 PM 12/10/2022    3:47 PM 11/21/2022    9:22 AM 10/24/2022    2:00 AM 10/20/2022    3:11 PM 10/19/2022    8:05 PM 12/09/2021   11:41 AM  Advanced Directives  Does Patient Have a Medical Advance Directive? No No No No  No Yes  Type of Advance Directive       Living will;Healthcare Power of Attorney  Does patient want to make changes to medical advance directive?       No - Patient declined  Copy of Healthcare Power of Attorney in Chart?       No - copy requested  Would patient like information on creating a medical advance directive?  No - Patient declined No - Patient declined No - Patient declined No - Patient declined      Current Medications (verified) Outpatient Encounter Medications as of 12/31/2023  Medication Sig   amLODipine  (NORVASC ) 10 MG tablet Take 1 tablet (10 mg total) by mouth daily.   aspirin 325 MG tablet Take 325 mg by mouth daily.   atorvastatin  (LIPITOR) 10 MG tablet TAKE 1  TABLET(10 MG) BY MOUTH DAILY   Calcium  Carb-Cholecalciferol (CALTRATE 600+D3) 600-20 MG-MCG TABS    Coenzyme Q10 (CO Q 10 PO) Take by mouth.   loratadine  (CLARITIN ) 10 MG tablet Take 1 tablet (10 mg total) by mouth as needed.   losartan  (COZAAR ) 100 MG tablet TAKE 1 TABLET(100 MG) BY MOUTH DAILY   magnesium  (MAGTAB) 84 MG ( ) TBCR SR tablet Take 84 mg by mouth.   Omega-3 Fatty Acids (FISH OIL ) 500 MG CAPS Take by mouth 2 (two) times daily.   Semaglutide , 2 MG/DOSE, 8 MG/3ML SOPN Inject 2 mg as directed once a week.   silver  sulfADIAZINE  (SILVADENE ) 1 % cream Apply 1 application topically daily.   vitamin k 100 MCG tablet Take 100 mcg by mouth daily.   No facility-administered encounter medications on file as of 12/31/2023.    Allergies (verified) Penicillins   History: Past Medical History:  Diagnosis Date   Allergic rhinitis, cause unspecified 03/12/2012   Allergy    SEASONAL   Anxiety    Diabetes (HCC) 06/26/2008   Centricity Description: DM Qualifier: Diagnosis of  By: Norleen MD, Lynwood ORN  Centricity Description: DIABETES MELLITUS, TYPE II Qualifier: Diagnosis of  By: Norleen MD, Lynwood ORN    DIVERTICULOSIS, COLON 07/05/2008   DM w/o Complication Type II 06/26/2008   HYPERLIPIDEMIA 06/27/2008   HYPERTENSION 06/27/2008   Kidney stones  Mini stroke    OBSTRUCTIVE SLEEP APNEA 06/26/2008   Sleep apnea    CPAP   Stroke [facility])    MINI   Past Surgical History:  Procedure Laterality Date   COLONOSCOPY     COSMETIC SURGERY     NOSE SURGERY  1970   cosmetic   TONSILLECTOMY     Family History  Problem Relation Age of Onset   Heart disease Father        MI-smoker   Cancer Maternal Grandfather        type unknown   Cancer Maternal Grandmother        type unknown   Emphysema Paternal Uncle    Social History   Socioeconomic History   Marital status: Married    Spouse name: Karna   Number of children: 2   Years of education: Not on file   Highest education level: Bachelor's degree  (e.g., BA, AB, BS)  Occupational History   Occupation: RETIRED/POSTAL Dentist: [employer]  Tobacco Use   Smoking status: Never   Smokeless tobacco: Never  Vaping Use   Vaping status: Never Used  Substance and Sexual Activity   Alcohol use: Not Currently    Comment: 0-2 per day   Drug use: No   Sexual activity: Not on file  Other Topics Concern   Not on file  Social History Narrative   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 He's married 2 daughters. 0-2 alcoholic drinks a day 0-2 caffeinated beverages a day.04/18/2016      Live with wife-2025   Social Drivers of Health   Financial Resource Strain: Low Risk  (12/01/2023)   Overall Financial Resource Strain (CARDIA)    Difficulty of Paying Living Expenses: Not hard at all  Food Insecurity: No Food Insecurity (12/01/2023)   Hunger Vital Sign    Worried About Running Out of Food in the Last Year: Never true    Ran Out of Food in the Last Year: Never true  Transportation Needs: No Transportation Needs (12/01/2023)   PRAPARE - Administrator, Civil Service (Medical): No    Lack of Transportation (Non-Medical): No  Physical Activity: Sufficiently Active (12/01/2023)   Exercise Vital Sign    Days of Exercise per Week: 7 days    Minutes of Exercise per Session: 70 min  Stress: No Stress Concern Present (12/01/2023)   [employer] of Occupational Health - Occupational Stress Questionnaire    Feeling of Stress : Not at all  Social Connections: Moderately Isolated (12/01/2023)   Social Connection and Isolation Panel [NHANES]    Frequency of Communication with Friends and Family: More than three times a week    Frequency of Social Gatherings with Friends and Family: More than three times a week    Attends Religious Services: Never    Database Administrator or Organizations: No    Attends Engineer, Structural: Not on file     Marital Status: Married    Tobacco Counseling Counseling given: Not Answered   Clinical Intake:  Pre-visit preparation completed: Yes  Pain : No/denies pain     BMI - recorded: 33 Nutritional Status: BMI > 30  Obese Nutritional Risks: None Diabetes: Yes CBG done?: No (does not check at home) Did pt. bring in CBG monitor from home?: No  How often do you need to have someone help you when you read instructions, pamphlets,  or other written materials from your doctor or pharmacy?: 1 - Never  Interpreter Needed?: No  Information entered by :: Konstantinos Cordoba, RMA   Activities of Daily Living    12/31/2023    1:44 PM  In your present state of health, do you have any difficulty performing the following activities:  Hearing? 0  Vision? 0  Difficulty concentrating or making decisions? 0  Walking or climbing stairs? 0  Dressing or bathing? 0  Doing errands, shopping? 0  Preparing Food and eating ? N  Using the Toilet? N  In the past six months, have you accidently leaked urine? N  Do you have problems with loss of bowel control? N  Managing your Medications? N  Managing your Finances? N  Housekeeping or managing your Housekeeping? N    Patient Care Team: Norleen Lynwood ORN, MD as PCP - General Cleotilde Sewer, OD (Optometry)  Indicate any recent Medical Services you may have received from other than [facility] providers in the past year (date may be approximate).     Assessment:   This is a routine wellness examination for Zaki.  Hearing/Vision screen Hearing Screening - Comments:: Denies hearing difficulties   Vision Screening - Comments:: Wears eyeglasses   Goals Addressed               This Visit's Progress     Patient Stated (pt-stated)        More exercise      Depression Screen    12/01/2023    1:46 PM 05/19/2023   10:33 AM 12/10/2022    3:56 PM 12/05/2022    9:30 AM 06/10/2022   11:08 AM 12/09/2021   11:19 AM 06/07/2021   11:12 AM  PHQ 2/9 Scores   PHQ - 2 Score 0 0 0 0 0 0 0  PHQ- 9 Score   0 0 0      Fall Risk    12/31/2023    1:48 PM 12/01/2023    1:46 PM 05/19/2023   10:33 AM 12/10/2022    3:47 PM 12/05/2022    9:30 AM  Fall Risk   Falls in the past year? 0 0 0 0 0  Number falls in past yr: 0 0 0 0   Injury with Fall? 0 0 0 0   Risk for fall due to : No Fall Risks No Fall Risks No Fall Risks No Fall Risks   Follow up Falls prevention discussed;Falls evaluation completed Falls evaluation completed Falls evaluation completed Falls prevention discussed     MEDICARE RISK AT HOME: Medicare Risk at Home Any stairs in or around the home?: Yes If so, are there any without handrails?: Yes Home free of loose throw rugs in walkways, pet beds, electrical cords, etc?: Yes Adequate lighting in your home to reduce risk of falls?: Yes Life alert?: No Use of a cane, walker or w/c?: No Grab bars in the bathroom?: No Shower chair or bench in shower?: No Elevated toilet seat or a handicapped toilet?: No  TIMED UP AND GO:  Was the test performed?  No    Cognitive Function:        12/10/2022    3:57 PM  6CIT Screen  What Year? 0 points  What month? 0 points  What time? 0 points  Count back from 20 0 points  Months in reverse 0 points  Repeat phrase 0 points  Total Score 0 points    Immunizations Immunization History  Administered Date(s) Administered  Fluad Quad(high Dose 65+) 10/11/2023   Influenza Whole 11/15/2010   Influenza, High Dose Seasonal PF 10/07/2018, 10/05/2019   Influenza,inj,Quad PF,6+ Mos 09/30/2017   Influenza-Unspecified 09/22/2015, 10/22/2020, 09/09/2021, 10/27/2022   PFIZER(Purple Top)SARS-COV-2 Vaccination 01/27/2020, 02/17/2020, 09/18/2020, 04/01/2021, 10/17/2021, 10/13/2022   Pneumococcal Conjugate-13 02/25/2016, 03/31/2017   Pneumococcal Polysaccharide-23 12/30/2018   Td 06/27/1998, 07/26/2010   Tdap 12/04/2016   Zoster Recombinant(Shingrix) 06/07/2021, 08/13/2021    TDAP status: Up to  date  Flu Vaccine status: Up to date  Pneumococcal vaccine status: Up to date  Covid-19 vaccine status: Completed vaccines  Qualifies for Shingles Vaccine? Yes   Zostavax completed Yes   Shingrix Completed?: Yes  Screening Tests Health Maintenance  Topic Date Due   HEMOGLOBIN A1C  05/30/2024   Diabetic kidney evaluation - eGFR measurement  11/29/2024   Diabetic kidney evaluation - Urine ACR  11/29/2024   FOOT EXAM  11/30/2024   OPHTHALMOLOGY EXAM  12/01/2024   Medicare Annual Wellness (AWV)  12/30/2024   Colonoscopy  05/16/2026   DTaP/Tdap/Td (4 - Td or Tdap) 12/04/2026   Pneumonia Vaccine 52+ Years old  Completed   INFLUENZA VACCINE  Completed   Hepatitis C Screening  Completed   Zoster Vaccines- Shingrix  Completed   HPV VACCINES  Aged Out   COVID-19 Vaccine  Discontinued    Health Maintenance  There are no preventive care reminders to display for this patient.   Colorectal cancer screening: Type of screening: Colonoscopy. Completed 05/16/2016. Repeat every 10 years  Lung Cancer Screening: (Low Dose CT Chest recommended if Age 91-80 years, 20 pack-year currently smoking OR have quit w/in 15years.) does not qualify.   Lung Cancer Screening Referral: N/A  Additional Screening:  Hepatitis C Screening: does qualify; Completed 03/30/2017  Vision Screening: Recommended annual ophthalmology exams for early detection of glaucoma and other disorders of the eye. Is the patient up to date with their annual eye exam?  Yes  Who is the provider or what is the name of the office in which the patient attends annual eye exams? Dr. Cleot[facility]) If pt is not established with a provider, would they like to be referred to a provider to establish care? No .   Dental Screening: Recommended annual dental exams for proper oral hygiene  Diabetic Foot Exam: Diabetic Foot Exam: Completed 12/01/2023  Community Resource Referral / Chronic Care Management: CRR required this  visit?  No   CCM required this visit?  No     Plan:     I have personally reviewed and noted the following in the patient's chart:   Medical and social history Use of alcohol, tobacco or illicit drugs  Current medications and supplements including opioid prescriptions. Patient is not currently taking opioid prescriptions. Functional ability and status Nutritional status Physical activity Advanced directives List of other physicians Hospitalizations, surgeries, and ER visits in previous 12 months Vitals Screenings to include cognitive, depression, and falls Referrals and appointments  In addition, I have reviewed and discussed with patient certain preventive protocols, quality metrics, and best practice recommendations. A written personalized care plan for preventive services as well as general preventive health recommendations were provided to patient.     Neal Oshea L Brexley Cutshaw, CMA   12/31/2023   After Visit Summary: (MyChart) Due to this being a telephonic visit, the after visit summary with patients personalized plan was offered to patient via MyChart   Nurse Notes: Patient is up to date with all health maintenance. Patient had no other concerns  to address today.

## 2023-12-31 NOTE — Patient Instructions (Signed)
 [name] , Thank you for taking time to come for your Medicare Wellness Visit. I appreciate your ongoing commitment to your health goals. Please review the following plan we discussed and let me know if I can assist you in the future.   Referrals/Orders/Follow-Ups/Clinician Recommendations: Aim for 30 minutes of exercise or brisk walking, 6-8 glasses of water, and 5 servings of fruits and vegetables each day.  It was nice talking with you today.    This is a list of the screening recommended for you and due dates:  Health Maintenance  Topic Date Due   Hemoglobin A1C  05/30/2024   Yearly kidney function blood test for diabetes  11/29/2024   Yearly kidney health urinalysis for diabetes  11/29/2024   Complete foot exam   11/30/2024   Eye exam for diabetics  12/01/2024   Medicare Annual Wellness Visit  12/30/2024   Colon Cancer Screening  05/16/2026   DTaP/Tdap/Td vaccine (4 - Td or Tdap) 12/04/2026   Pneumonia Vaccine  Completed   Flu Shot  Completed   Hepatitis C Screening  Completed   Zoster (Shingles) Vaccine  Completed   HPV Vaccine  Aged Out   COVID-19 Vaccine  Discontinued    Advanced directives: (Declined) Advance directive discussed with you today. Even though you declined this today, please call our office should you change your mind, and we can give you the proper paperwork for you to fill out.  Next Medicare Annual Wellness Visit scheduled for next year: Yes

## 2024-01-01 NOTE — Telephone Encounter (Signed)
 I would not recommend as even if a blood test showed elevated b6 , this does not prove it caused the problem and the action would be the same anyway to simply stop further b6.  I have not heard of this association in the past as well.  The nerve pinching in the spine is much more liekly to be the cause of nerve symptoms in the legs even to the feet   thanks

## 2024-01-18 ENCOUNTER — Other Ambulatory Visit: Payer: Self-pay | Admitting: Internal Medicine

## 2024-01-18 ENCOUNTER — Other Ambulatory Visit: Payer: Self-pay

## 2024-01-25 ENCOUNTER — Encounter: Payer: Self-pay | Admitting: Internal Medicine

## 2024-01-25 ENCOUNTER — Encounter: Payer: Self-pay | Admitting: "Endocrinology

## 2024-01-25 ENCOUNTER — Telehealth: Payer: Self-pay

## 2024-01-25 ENCOUNTER — Other Ambulatory Visit: Payer: Self-pay

## 2024-01-25 ENCOUNTER — Ambulatory Visit (INDEPENDENT_AMBULATORY_CARE_PROVIDER_SITE_OTHER): Payer: Medicare Other | Admitting: "Endocrinology

## 2024-01-25 VITALS — BP 118/80 | HR 94 | Ht 75.0 in | Wt 266.0 lb

## 2024-01-25 DIAGNOSIS — M810 Age-related osteoporosis without current pathological fracture: Secondary | ICD-10-CM | POA: Diagnosis not present

## 2024-01-25 NOTE — Progress Notes (Signed)
[facility] Note Danny [place], MD    Referring Provider: Corwin Levins, MD Primary Care Provider: Corwin Levins, MD Chief Complaint  Patient presents with   Osteoporosis    Assessment & Plan  Danny [name] was seen today for osteoporosis.  Diagnoses and all orders for this visit:  Age-related osteoporosis without current pathological fracture -     Comp Met (CMET) -     Magnesium -     VITAMIN D 25 Hydroxy (Vit-D Deficiency, Fractures)    Likely age-related.  BMD results suggest: -2.5 distal radius in 11/2022. On calcium 600 mg twice daily, vitamin D 800 units BID as well as Magnesium citrate 150 mg OTC supplements. Continue weight bearing exercise options and dietary supplements. Previously had long discussion of pros/cons of treatment and treatment modalities.   Patient never started fosamax, preferred and changed to reclast s/p first infusion on 06/16/23. Patient recalls having some side effects for 3 days post reclast, doesn't recall details. Next reclast dose in 05/2024 ordered.  Patient would like to get tested for Ca, Mg and Vit D, taking supplements  + MVI.  Follow fall precautions, adequate dairy in diet and exercises (aerobic, balancing and weight bearing) as tolerated.  Next DXA due in 12/2024.   I have reviewed current medications, nurse's notes, allergies, vital signs, past medical and surgical history, family medical history, and social history for this encounter. Counseled patient on symptoms, examination findings, lab findings, imaging results, treatment decisions and monitoring and prognosis. The patient understood the recommendations and agrees with the treatment plan. All questions regarding treatment plan were fully answered.   Return in about 11 months (around 12/24/2024).   Danny Arial, MD   01/25/24    History of Present Illness Danny [name] is a 73 y.o. year old male who presents to follow up for osteoporosis diagnosed in 11/2022.  BMD  completed on 11/2022  suggested osteoporosis. He is currently taking Calcium 600 mg twice a  day and vitamin D 800 international units twice daily.  Denies any falls/fractures.  Continues to have posture related back pain Back to exercise/walking   Risk Factors screening:  History of low trauma fractures: No Family history of osteoporosis: No Hip fracture in first-degree relatives: No Smoking history: No Excessive alcohol intake >2 drinks/day: No Excessive caffeine intake >2 drinks/day: Yes Glucocorticoid use >5mg  prednisone/day for >3 months: No Rheumatoid arthritis history: No  Results:11/2022 DXA   Lumbar spine L3-L4 Femoral neck (FN) Ultra distal radius  T-score   -1.8 RFN: -1.7 LFN: -1.3 -2.5    Physical Exam  BP 118/80 (BP Location: Left Arm, Patient Position: Sitting, Cuff Size: Large)   Pulse 94   Ht 6\' 3"  (1.905 m)   Wt 266 lb (120.7 kg)   SpO2 97%   BMI 33.25 kg/m  Constitutional: well developed, well nourished Head: normocephalic, atraumatic Eyes: sclera anicteric, no redness Neck: supple Lungs: normal respiratory effort Neurology: alert and oriented Skin: dry, no appreciable rashes Musculoskeletal: no appreciable defects Psychiatric: normal mood and affect  Allergies Allergies  Allergen Reactions   Penicillins Other (See Comments)    He is not sure what the reaction    Current Medications Patient's Medications  New Prescriptions   No medications on file  Previous Medications   AMLODIPINE (NORVASC) 10 MG TABLET    TAKE 1 TABLET(10 MG) BY MOUTH DAILY   ASPIRIN 325 MG TABLET    Take 325 mg by mouth daily.   ATORVASTATIN (LIPITOR)  10 MG TABLET    TAKE 1 TABLET(10 MG) BY MOUTH DAILY   CALCIUM CARB-CHOLECALCIFEROL (CALTRATE 600+D3) 600-20 MG-MCG TABS       COENZYME Q10 (CO Q 10 PO)    Take by mouth.   LORATADINE (CLARITIN) 10 MG TABLET    Take 1 tablet (10 mg total) by mouth as needed.   LOSARTAN (COZAAR) 100 MG TABLET    TAKE 1 TABLET(100 MG) BY MOUTH  DAILY   MAGNESIUM (MAGTAB) 84 MG ( ) TBCR SR TABLET    Take 84 mg by mouth.   OMEGA-3 FATTY ACIDS (FISH OIL) 500 MG CAPS    Take by mouth 2 (two) times daily.   SEMAGLUTIDE, 2 MG/DOSE, 8 MG/3ML SOPN    Inject 2 mg as directed once a week.   SILVER SULFADIAZINE (SILVADENE) 1 % CREAM    Apply 1 application topically daily.   VITAMIN K 100 MCG TABLET    Take 100 mcg by mouth daily.  Modified Medications   No medications on file  Discontinued Medications   No medications on file     Past Medical History Past Medical History:  Diagnosis Date   Allergic rhinitis, cause unspecified 03/12/2012   Allergy    SEASONAL   Anxiety    Diabetes (HCC) 06/26/2008   Centricity Description: DM Qualifier: Diagnosis of  By: Jonny Ruiz MD, Len Blalock  Centricity Description: DIABETES MELLITUS, TYPE II Qualifier: Diagnosis of  By: Jonny Ruiz MD, Len Blalock    DIVERTICULOSIS, COLON 07/05/2008   DM w/o Complication Type II 06/26/2008   HYPERLIPIDEMIA 06/27/2008   HYPERTENSION 06/27/2008   Kidney stones    Mini stroke    OBSTRUCTIVE SLEEP APNEA 06/26/2008   Sleep apnea    CPAP   Stroke [facility])    MINI    Past Surgical History Past Surgical History:  Procedure Laterality Date   COLONOSCOPY     COSMETIC SURGERY     NOSE SURGERY  1970   cosmetic   TONSILLECTOMY      Family History family history includes Cancer in his maternal grandfather and maternal grandmother; Emphysema in his paternal uncle; Heart disease in his father.  Social History Social History   Socioeconomic History   Marital status: Married    Spouse name: Angelique Blonder   Number of children: 2   Years of education: Not on file   Highest education level: Bachelor's degree (e.g., BA, AB, BS)  Occupational History   Occupation: RETIRED/POSTAL Dentist: [employer]  Tobacco Use   Smoking status: Never   Smokeless tobacco: Never  Vaping Use   Vaping status: Never Used  Substance and Sexual Activity   Alcohol use: Not Currently    Comment:  0-2 per day   Drug use: No   Sexual activity: Not on file  Other Topics Concern   Not on file  Social History Narrative   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 He's married 2 daughters. 0-2 alcoholic drinks a day 0-2 caffeinated beverages a day.04/18/2016      Live with wife-2025   Social Drivers of Health   Financial Resource Strain: Low Risk  (12/31/2023)   Overall Financial Resource Strain (CARDIA)    Difficulty of Paying Living Expenses: Not hard at all  Food Insecurity: No Food Insecurity (12/31/2023)   Hunger Vital Sign    Worried About Running Out of Food in the Last Year: Never true    Ran  Out of Food in the Last Year: Never true  Transportation Needs: No Transportation Needs (12/31/2023)   PRAPARE - Administrator, Civil Service (Medical): No    Lack of Transportation (Non-Medical): No  Physical Activity: Sufficiently Active (12/31/2023)   Exercise Vital Sign    Days of Exercise per Week: 5 days    Minutes of Exercise per Session: 60 min  Stress: No Stress Concern Present (12/31/2023)   [employer] of Occupational Health - Occupational Stress Questionnaire    Feeling of Stress : Not at all  Social Connections: Moderately Isolated (12/31/2023)   Social Connection and Isolation Panel [NHANES]    Frequency of Communication with Friends and Family: More than three times a week    Frequency of Social Gatherings with Friends and Family: More than three times a week    Attends Religious Services: Never    Database administrator or Organizations: No    Attends Banker Meetings: Never    Marital Status: Married  Catering manager Violence: Not At Risk (12/31/2023)   Humiliation, Afraid, Rape, and Kick questionnaire    Fear of Current or Ex-Partner: No    Emotionally Abused: No    Physically Abused: No    Sexually Abused: No    Laboratory Investigations No components found for: "CMP" No  components found for: "BMP" Lab Results  Component Value Date   GFR 77.28 11/30/2023   Lab Results  Component Value Date   CREATININE 0.99 11/30/2023   Component     Latest Ref Rng 05/15/2023     Calcium     8.4 - 10.5 mg/dL 9.0   VITD     16.10 - 100.00 ng/mL 32.60         TSH     0.35 - 5.50 uIU/mL 2.30     Lab Results  Component Value Date   TSH 2.26 11/30/2023    Parts of this note may have been dictated using voice recognition software. There may be variances in spelling and vocabulary which are unintentional. Not all errors are proofread. Please notify the Thereasa Parkin if any discrepancies are noted or if the meaning of any statement is not clear.

## 2024-01-25 NOTE — Telephone Encounter (Signed)
Auth Submission: NO AUTH NEEDED Site of care: Site of care: [facility] Payer: Medicare A/B with BCBS FEP Medication & CPT/J Code(s) submitted: Reclast (Zolendronic acid) [id number] Route of submission (phone, fax, portal):  Phone # Fax # Auth type: Buy/Bill PB Units/visits requested: 5mg  x 1 dose Reference number:  Approval from: 01/25/24 to 01/21/25

## 2024-01-26 ENCOUNTER — Telehealth: Payer: Self-pay

## 2024-01-26 ENCOUNTER — Other Ambulatory Visit: Payer: Self-pay

## 2024-01-26 LAB — COMPREHENSIVE METABOLIC PANEL
AG Ratio: 2 (calc) (ref 1.0–2.5)
ALT: 23 U/L (ref 9–46)
AST: 23 U/L (ref 10–35)
Albumin: 4.8 g/dL (ref 3.6–5.1)
Alkaline phosphatase (APISO): 38 U/L (ref 35–144)
BUN: 15 mg/dL (ref 7–25)
CO2: 23 mmol/L (ref 20–32)
Calcium: 9.7 mg/dL (ref 8.6–10.3)
Chloride: 106 mmol/L (ref 98–110)
Creat: 1.1 mg/dL (ref 0.70–1.28)
Globulin: 2.4 g/dL (ref 1.9–3.7)
Glucose, Bld: 92 mg/dL (ref 65–99)
Potassium: 4.2 mmol/L (ref 3.5–5.3)
Sodium: 140 mmol/L (ref 135–146)
Total Bilirubin: 0.7 mg/dL (ref 0.2–1.2)
Total Protein: 7.2 g/dL (ref 6.1–8.1)

## 2024-01-26 LAB — MAGNESIUM: Magnesium: 2.4 mg/dL (ref 1.5–2.5)

## 2024-01-26 LAB — VITAMIN D 25 HYDROXY (VIT D DEFICIENCY, FRACTURES): Vit D, 25-Hydroxy: 44 ng/mL (ref 30–100)

## 2024-01-26 MED ORDER — ATORVASTATIN CALCIUM 10 MG PO TABS: 10.0000 mg | ORAL_TABLET | Freq: Every day | ORAL | 1 refills | Status: DC

## 2024-01-26 NOTE — Telephone Encounter (Signed)
-----   Message from [facility] sent at 01/26/2024  8:46 AM EST ----- Please let the patient know that he can cut back his magnesium to every other day and increase his Caltrate/calcium-vit D by 1 more pill (one every 8 hrs). Thank you

## 2024-01-26 NOTE — Telephone Encounter (Signed)
  Patient given results and medication changes as directed by MD. No further questions at this time.

## 2024-02-03 DIAGNOSIS — Z6833 Body mass index (BMI) 33.0-33.9, adult: Secondary | ICD-10-CM | POA: Diagnosis not present

## 2024-02-03 DIAGNOSIS — M48061 Spinal stenosis, lumbar region without neurogenic claudication: Secondary | ICD-10-CM | POA: Diagnosis not present

## 2024-03-03 ENCOUNTER — Ambulatory Visit (HOSPITAL_BASED_OUTPATIENT_CLINIC_OR_DEPARTMENT_OTHER): Payer: Medicare Other | Attending: Neurological Surgery | Admitting: Physical Therapy

## 2024-03-03 ENCOUNTER — Other Ambulatory Visit: Payer: Self-pay

## 2024-03-03 DIAGNOSIS — M48061 Spinal stenosis, lumbar region without neurogenic claudication: Secondary | ICD-10-CM | POA: Insufficient documentation

## 2024-03-03 DIAGNOSIS — M79604 Pain in right leg: Secondary | ICD-10-CM | POA: Insufficient documentation

## 2024-03-03 DIAGNOSIS — M6281 Muscle weakness (generalized): Secondary | ICD-10-CM | POA: Insufficient documentation

## 2024-03-03 DIAGNOSIS — M5459 Other low back pain: Secondary | ICD-10-CM | POA: Insufficient documentation
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
## 2024-03-04 ENCOUNTER — Encounter (HOSPITAL_BASED_OUTPATIENT_CLINIC_OR_DEPARTMENT_OTHER): Payer: Self-pay | Admitting: Physical Therapy

## 2024-03-08 ENCOUNTER — Ambulatory Visit (HOSPITAL_BASED_OUTPATIENT_CLINIC_OR_DEPARTMENT_OTHER)

## 2024-03-08 ENCOUNTER — Encounter (HOSPITAL_BASED_OUTPATIENT_CLINIC_OR_DEPARTMENT_OTHER): Payer: Self-pay

## 2024-03-08 DIAGNOSIS — M6281 Muscle weakness (generalized): Secondary | ICD-10-CM

## 2024-03-08 DIAGNOSIS — M5459 Other low back pain: Secondary | ICD-10-CM | POA: Diagnosis not present

## 2024-03-08 DIAGNOSIS — M79604 Pain in right leg: Secondary | ICD-10-CM | POA: Diagnosis not present

## 2024-03-08 DIAGNOSIS — M48061 Spinal stenosis, lumbar region without neurogenic claudication: Secondary | ICD-10-CM | POA: Diagnosis not present
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
## 2024-03-10 ENCOUNTER — Ambulatory Visit (HOSPITAL_BASED_OUTPATIENT_CLINIC_OR_DEPARTMENT_OTHER): Admitting: Physical Therapy

## 2024-03-10 ENCOUNTER — Encounter (HOSPITAL_BASED_OUTPATIENT_CLINIC_OR_DEPARTMENT_OTHER): Payer: Self-pay | Admitting: Physical Therapy

## 2024-03-10 DIAGNOSIS — M79604 Pain in right leg: Secondary | ICD-10-CM

## 2024-03-10 DIAGNOSIS — M6281 Muscle weakness (generalized): Secondary | ICD-10-CM

## 2024-03-10 DIAGNOSIS — M48061 Spinal stenosis, lumbar region without neurogenic claudication: Secondary | ICD-10-CM | POA: Diagnosis not present

## 2024-03-10 DIAGNOSIS — M5459 Other low back pain: Secondary | ICD-10-CM
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
## 2024-03-18 ENCOUNTER — Ambulatory Visit (HOSPITAL_BASED_OUTPATIENT_CLINIC_OR_DEPARTMENT_OTHER)

## 2024-03-18 ENCOUNTER — Encounter (HOSPITAL_BASED_OUTPATIENT_CLINIC_OR_DEPARTMENT_OTHER): Payer: Self-pay

## 2024-03-18 DIAGNOSIS — M6281 Muscle weakness (generalized): Secondary | ICD-10-CM | POA: Diagnosis not present

## 2024-03-18 DIAGNOSIS — M5459 Other low back pain: Secondary | ICD-10-CM

## 2024-03-18 DIAGNOSIS — M79604 Pain in right leg: Secondary | ICD-10-CM | POA: Diagnosis not present

## 2024-03-18 DIAGNOSIS — M48061 Spinal stenosis, lumbar region without neurogenic claudication: Secondary | ICD-10-CM | POA: Diagnosis not present
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
## 2024-03-22 ENCOUNTER — Ambulatory Visit (HOSPITAL_BASED_OUTPATIENT_CLINIC_OR_DEPARTMENT_OTHER): Attending: Neurological Surgery | Admitting: Physical Therapy

## 2024-03-22 ENCOUNTER — Encounter (HOSPITAL_BASED_OUTPATIENT_CLINIC_OR_DEPARTMENT_OTHER): Payer: Self-pay | Admitting: Physical Therapy

## 2024-03-22 DIAGNOSIS — M79604 Pain in right leg: Secondary | ICD-10-CM | POA: Diagnosis not present

## 2024-03-22 DIAGNOSIS — M6281 Muscle weakness (generalized): Secondary | ICD-10-CM | POA: Diagnosis not present

## 2024-03-22 DIAGNOSIS — M5459 Other low back pain: Secondary | ICD-10-CM | POA: Diagnosis not present
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
## 2024-03-24 ENCOUNTER — Ambulatory Visit (HOSPITAL_BASED_OUTPATIENT_CLINIC_OR_DEPARTMENT_OTHER): Admitting: Physical Therapy

## 2024-03-24 ENCOUNTER — Encounter (HOSPITAL_BASED_OUTPATIENT_CLINIC_OR_DEPARTMENT_OTHER): Payer: Self-pay | Admitting: Physical Therapy

## 2024-03-24 DIAGNOSIS — M6281 Muscle weakness (generalized): Secondary | ICD-10-CM

## 2024-03-24 DIAGNOSIS — M5459 Other low back pain: Secondary | ICD-10-CM | POA: Diagnosis not present

## 2024-03-24 DIAGNOSIS — M79604 Pain in right leg: Secondary | ICD-10-CM | POA: Diagnosis not present
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
## 2024-03-29 ENCOUNTER — Ambulatory Visit (HOSPITAL_BASED_OUTPATIENT_CLINIC_OR_DEPARTMENT_OTHER): Admitting: Physical Therapy

## 2024-03-29 DIAGNOSIS — M6281 Muscle weakness (generalized): Secondary | ICD-10-CM

## 2024-03-29 DIAGNOSIS — M5459 Other low back pain: Secondary | ICD-10-CM

## 2024-03-29 DIAGNOSIS — M79604 Pain in right leg: Secondary | ICD-10-CM
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
## 2024-03-30 ENCOUNTER — Encounter (HOSPITAL_BASED_OUTPATIENT_CLINIC_OR_DEPARTMENT_OTHER): Payer: Self-pay | Admitting: Physical Therapy

## 2024-03-31 ENCOUNTER — Ambulatory Visit (HOSPITAL_BASED_OUTPATIENT_CLINIC_OR_DEPARTMENT_OTHER): Admitting: Physical Therapy

## 2024-03-31 ENCOUNTER — Encounter (HOSPITAL_BASED_OUTPATIENT_CLINIC_OR_DEPARTMENT_OTHER): Payer: Self-pay | Admitting: Physical Therapy

## 2024-03-31 DIAGNOSIS — M79604 Pain in right leg: Secondary | ICD-10-CM | POA: Diagnosis not present

## 2024-03-31 DIAGNOSIS — M6281 Muscle weakness (generalized): Secondary | ICD-10-CM

## 2024-03-31 DIAGNOSIS — M5459 Other low back pain: Secondary | ICD-10-CM
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
## 2024-04-05 ENCOUNTER — Ambulatory Visit (HOSPITAL_BASED_OUTPATIENT_CLINIC_OR_DEPARTMENT_OTHER)

## 2024-04-05 ENCOUNTER — Encounter (HOSPITAL_BASED_OUTPATIENT_CLINIC_OR_DEPARTMENT_OTHER): Payer: Self-pay

## 2024-04-05 DIAGNOSIS — M5459 Other low back pain: Secondary | ICD-10-CM | POA: Diagnosis not present

## 2024-04-05 DIAGNOSIS — M79604 Pain in right leg: Secondary | ICD-10-CM

## 2024-04-05 DIAGNOSIS — M6281 Muscle weakness (generalized): Secondary | ICD-10-CM | POA: Diagnosis not present
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
## 2024-04-06 DIAGNOSIS — Z6833 Body mass index (BMI) 33.0-33.9, adult: Secondary | ICD-10-CM | POA: Diagnosis not present

## 2024-04-06 DIAGNOSIS — M48061 Spinal stenosis, lumbar region without neurogenic claudication: Secondary | ICD-10-CM | POA: Diagnosis not present

## 2024-04-07 ENCOUNTER — Ambulatory Visit (HOSPITAL_BASED_OUTPATIENT_CLINIC_OR_DEPARTMENT_OTHER)

## 2024-04-07 ENCOUNTER — Encounter (HOSPITAL_BASED_OUTPATIENT_CLINIC_OR_DEPARTMENT_OTHER): Payer: Self-pay

## 2024-04-07 DIAGNOSIS — M5459 Other low back pain: Secondary | ICD-10-CM

## 2024-04-07 DIAGNOSIS — M6281 Muscle weakness (generalized): Secondary | ICD-10-CM | POA: Diagnosis not present

## 2024-04-07 DIAGNOSIS — M79604 Pain in right leg: Secondary | ICD-10-CM
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
## 2024-04-12 ENCOUNTER — Ambulatory Visit (HOSPITAL_BASED_OUTPATIENT_CLINIC_OR_DEPARTMENT_OTHER): Admitting: Physical Therapy

## 2024-04-12 DIAGNOSIS — M79604 Pain in right leg: Secondary | ICD-10-CM

## 2024-04-12 DIAGNOSIS — M5459 Other low back pain: Secondary | ICD-10-CM | POA: Diagnosis not present

## 2024-04-12 DIAGNOSIS — M6281 Muscle weakness (generalized): Secondary | ICD-10-CM
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
## 2024-04-13 ENCOUNTER — Encounter (HOSPITAL_BASED_OUTPATIENT_CLINIC_OR_DEPARTMENT_OTHER): Payer: Self-pay | Admitting: Physical Therapy

## 2024-04-14 ENCOUNTER — Encounter (HOSPITAL_BASED_OUTPATIENT_CLINIC_OR_DEPARTMENT_OTHER): Payer: Self-pay | Admitting: Physical Therapy

## 2024-04-14 ENCOUNTER — Ambulatory Visit (HOSPITAL_BASED_OUTPATIENT_CLINIC_OR_DEPARTMENT_OTHER): Admitting: Physical Therapy

## 2024-04-14 DIAGNOSIS — M79604 Pain in right leg: Secondary | ICD-10-CM

## 2024-04-14 DIAGNOSIS — M6281 Muscle weakness (generalized): Secondary | ICD-10-CM

## 2024-04-14 DIAGNOSIS — M5459 Other low back pain: Secondary | ICD-10-CM | POA: Diagnosis not present
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
## 2024-04-19 ENCOUNTER — Ambulatory Visit (HOSPITAL_BASED_OUTPATIENT_CLINIC_OR_DEPARTMENT_OTHER)

## 2024-04-19 ENCOUNTER — Encounter (HOSPITAL_BASED_OUTPATIENT_CLINIC_OR_DEPARTMENT_OTHER): Payer: Self-pay

## 2024-04-19 DIAGNOSIS — M79604 Pain in right leg: Secondary | ICD-10-CM

## 2024-04-19 DIAGNOSIS — M6281 Muscle weakness (generalized): Secondary | ICD-10-CM

## 2024-04-19 DIAGNOSIS — M5459 Other low back pain: Secondary | ICD-10-CM | POA: Diagnosis not present
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
## 2024-04-21 ENCOUNTER — Encounter (HOSPITAL_BASED_OUTPATIENT_CLINIC_OR_DEPARTMENT_OTHER): Payer: Self-pay | Admitting: Physical Therapy

## 2024-04-21 ENCOUNTER — Ambulatory Visit (HOSPITAL_BASED_OUTPATIENT_CLINIC_OR_DEPARTMENT_OTHER): Attending: Neurological Surgery | Admitting: Physical Therapy

## 2024-04-21 DIAGNOSIS — M79604 Pain in right leg: Secondary | ICD-10-CM | POA: Insufficient documentation

## 2024-04-21 DIAGNOSIS — M5459 Other low back pain: Secondary | ICD-10-CM | POA: Insufficient documentation

## 2024-04-21 DIAGNOSIS — M6281 Muscle weakness (generalized): Secondary | ICD-10-CM | POA: Diagnosis not present
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
## 2024-04-27 ENCOUNTER — Ambulatory Visit (HOSPITAL_BASED_OUTPATIENT_CLINIC_OR_DEPARTMENT_OTHER): Admitting: Physical Therapy

## 2024-04-27 ENCOUNTER — Encounter (HOSPITAL_BASED_OUTPATIENT_CLINIC_OR_DEPARTMENT_OTHER): Payer: Self-pay | Admitting: Physical Therapy

## 2024-04-27 DIAGNOSIS — M79604 Pain in right leg: Secondary | ICD-10-CM

## 2024-04-27 DIAGNOSIS — M5459 Other low back pain: Secondary | ICD-10-CM | POA: Diagnosis not present

## 2024-04-27 DIAGNOSIS — M6281 Muscle weakness (generalized): Secondary | ICD-10-CM
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
## 2024-04-29 ENCOUNTER — Other Ambulatory Visit: Payer: Self-pay | Admitting: Internal Medicine

## 2024-04-29 NOTE — Telephone Encounter (Unsigned)
 Copied from CRM [id number]. Topic: Clinical - Medication Refill >> Apr 29, 2024 10:13 AM Kita Perish H wrote: Medication: Semaglutide , 2 MG/DOSE, 8 MG/3ML SOPN    Has the patient contacted their pharmacy? Yes, pharmacy states they are waiting on provider approval (Agent: If no, request that the patient contact the pharmacy for the refill. If patient does not wish to contact the pharmacy document the reason why and proceed with request.) (Agent: If yes, when and what did the pharmacy advise?)  This is the patient's preferred pharmacy:  [id number] Jonette Nestle, [place] - [address] AT [facility] OF [address] & [facility] [address]  [place] [address] Phone: [phone] Fax: [phone]    Is this the correct pharmacy for this prescription? Yes If no, delete pharmacy and type the correct one.   Has the prescription been filled recently? No  Is the patient out of the medication? No  Has the patient been seen for an appointment in the last year OR does the patient have an upcoming appointment? Yes.   Can we respond through MyChart? Yes  Agent: Please be advised that Rx refills may take up to 3 business days. We ask that you follow-up with your pharmacy.

## 2024-05-02 ENCOUNTER — Encounter (HOSPITAL_BASED_OUTPATIENT_CLINIC_OR_DEPARTMENT_OTHER): Payer: Self-pay

## 2024-05-02 ENCOUNTER — Ambulatory Visit (HOSPITAL_BASED_OUTPATIENT_CLINIC_OR_DEPARTMENT_OTHER)

## 2024-05-02 DIAGNOSIS — M5459 Other low back pain: Secondary | ICD-10-CM | POA: Diagnosis not present

## 2024-05-02 DIAGNOSIS — M6281 Muscle weakness (generalized): Secondary | ICD-10-CM

## 2024-05-02 DIAGNOSIS — M79604 Pain in right leg: Secondary | ICD-10-CM
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
## 2024-05-03 ENCOUNTER — Other Ambulatory Visit: Payer: Self-pay

## 2024-05-03 MED ORDER — SEMAGLUTIDE (2 MG/DOSE) 8 MG/3ML ~~LOC~~ SOPN: 2.0000 mg | PEN_INJECTOR | SUBCUTANEOUS | 3 refills | Status: DC

## 2024-05-03 NOTE — Telephone Encounter (Signed)
 Copied from CRM [id number]. Topic: Clinical - Medication Question >> May 03, 2024  2:13 PM Danny [name] wrote: Reason for CRM: Patient called in to ask about status of refill for medication Semaglutide , 2 MG/DOSE, 8 MG/3ML SOPN. I informed patient that his provider is out on action and will be back on Monday.He stated that he  will be out of medication by the end of the week.

## 2024-05-05 ENCOUNTER — Encounter (HOSPITAL_BASED_OUTPATIENT_CLINIC_OR_DEPARTMENT_OTHER): Payer: Self-pay

## 2024-05-05 ENCOUNTER — Ambulatory Visit (HOSPITAL_BASED_OUTPATIENT_CLINIC_OR_DEPARTMENT_OTHER): Payer: Self-pay

## 2024-05-05 DIAGNOSIS — M79604 Pain in right leg: Secondary | ICD-10-CM | POA: Diagnosis not present

## 2024-05-05 DIAGNOSIS — M6281 Muscle weakness (generalized): Secondary | ICD-10-CM | POA: Diagnosis not present

## 2024-05-05 DIAGNOSIS — M5459 Other low back pain: Secondary | ICD-10-CM | POA: Diagnosis not present
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
## 2024-05-10 ENCOUNTER — Ambulatory Visit (HOSPITAL_BASED_OUTPATIENT_CLINIC_OR_DEPARTMENT_OTHER): Payer: Self-pay | Admitting: Physical Therapy

## 2024-05-10 ENCOUNTER — Encounter: Payer: Self-pay | Admitting: Internal Medicine

## 2024-05-10 DIAGNOSIS — M5459 Other low back pain: Secondary | ICD-10-CM | POA: Diagnosis not present

## 2024-05-10 DIAGNOSIS — M6281 Muscle weakness (generalized): Secondary | ICD-10-CM | POA: Diagnosis not present

## 2024-05-10 DIAGNOSIS — M79604 Pain in right leg: Secondary | ICD-10-CM | POA: Diagnosis not present
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
## 2024-05-11 ENCOUNTER — Encounter (HOSPITAL_BASED_OUTPATIENT_CLINIC_OR_DEPARTMENT_OTHER): Payer: Self-pay | Admitting: Physical Therapy

## 2024-05-18 ENCOUNTER — Encounter (HOSPITAL_BASED_OUTPATIENT_CLINIC_OR_DEPARTMENT_OTHER): Payer: Self-pay

## 2024-05-18 ENCOUNTER — Ambulatory Visit (HOSPITAL_BASED_OUTPATIENT_CLINIC_OR_DEPARTMENT_OTHER)

## 2024-05-18 DIAGNOSIS — M6281 Muscle weakness (generalized): Secondary | ICD-10-CM | POA: Diagnosis not present

## 2024-05-18 DIAGNOSIS — M5459 Other low back pain: Secondary | ICD-10-CM | POA: Diagnosis not present

## 2024-05-18 DIAGNOSIS — M79604 Pain in right leg: Secondary | ICD-10-CM
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
## 2024-05-19 NOTE — Addendum Note (Signed)
 Addended by: Grier Leber on: 05/19/2024 09:16 PM   Modules accepted: Orders

## 2024-05-24 ENCOUNTER — Encounter (HOSPITAL_BASED_OUTPATIENT_CLINIC_OR_DEPARTMENT_OTHER)

## 2024-05-26 ENCOUNTER — Encounter (HOSPITAL_BASED_OUTPATIENT_CLINIC_OR_DEPARTMENT_OTHER): Admitting: Physical Therapy

## 2024-05-30 ENCOUNTER — Telehealth

## 2024-05-30 DIAGNOSIS — L237 Allergic contact dermatitis due to plants, except food: Secondary | ICD-10-CM

## 2024-05-31 ENCOUNTER — Encounter (HOSPITAL_BASED_OUTPATIENT_CLINIC_OR_DEPARTMENT_OTHER)

## 2024-05-31 MED ORDER — PREDNISONE 10 MG PO TABS: ORAL_TABLET | ORAL | 0 refills | Status: AC

## 2024-05-31 NOTE — Progress Notes (Signed)
 I have spent 5 minutes in review of e-visit questionnaire, review and updating patient chart, medical decision making and response to patient.   Piedad Climes, PA-C

## 2024-05-31 NOTE — Progress Notes (Signed)

## 2024-06-01 DIAGNOSIS — H40053 Ocular hypertension, bilateral: Secondary | ICD-10-CM | POA: Diagnosis not present

## 2024-06-01 DIAGNOSIS — H40013 Open angle with borderline findings, low risk, bilateral: Secondary | ICD-10-CM | POA: Diagnosis not present

## 2024-06-02 ENCOUNTER — Encounter (HOSPITAL_BASED_OUTPATIENT_CLINIC_OR_DEPARTMENT_OTHER): Admitting: Physical Therapy

## 2024-06-04 ENCOUNTER — Encounter: Payer: Self-pay | Admitting: Internal Medicine

## 2024-06-06 ENCOUNTER — Other Ambulatory Visit (INDEPENDENT_AMBULATORY_CARE_PROVIDER_SITE_OTHER)

## 2024-06-06 DIAGNOSIS — R972 Elevated prostate specific antigen [PSA]: Secondary | ICD-10-CM | POA: Diagnosis not present

## 2024-06-06 DIAGNOSIS — E1165 Type 2 diabetes mellitus with hyperglycemia: Secondary | ICD-10-CM

## 2024-06-06 LAB — HEPATIC FUNCTION PANEL
ALT: 22 U/L (ref 0–53)
AST: 18 U/L (ref 0–37)
Albumin: 4.6 g/dL (ref 3.5–5.2)
Alkaline Phosphatase: 34 U/L — ABNORMAL LOW (ref 39–117)
Bilirubin, Direct: 0.1 mg/dL (ref 0.0–0.3)
Total Bilirubin: 0.5 mg/dL (ref 0.2–1.2)
Total Protein: 6.6 g/dL (ref 6.0–8.3)

## 2024-06-06 LAB — LIPID PANEL
Cholesterol: 112 mg/dL (ref 0–200)
HDL: 45.8 mg/dL (ref >39.00)
LDL Cholesterol: 53 mg/dL (ref 0–99)
NonHDL: 65.9
Total CHOL/HDL Ratio: 2
Triglycerides: 64 mg/dL (ref 0.0–149.0)
VLDL: 12.8 mg/dL (ref 0.0–40.0)

## 2024-06-06 LAB — BASIC METABOLIC PANEL WITH GFR
BUN: 18 mg/dL (ref 6–23)
CO2: 25 meq/L (ref 19–32)
Calcium: 9 mg/dL (ref 8.4–10.5)
Chloride: 105 meq/L (ref 96–112)
Creatinine, Ser: 0.97 mg/dL (ref 0.40–1.50)
GFR: 78.91 mL/min (ref >60.00)
Glucose, Bld: 94 mg/dL (ref 70–99)
Potassium: 3.7 meq/L (ref 3.5–5.1)
Sodium: 139 meq/L (ref 135–145)

## 2024-06-06 LAB — HEMOGLOBIN A1C: Hgb A1c MFr Bld: 6.1 % (ref 4.6–6.5)

## 2024-06-06 LAB — PSA: PSA: 1.48 ng/mL (ref 0.10–4.00)

## 2024-06-06 NOTE — Telephone Encounter (Signed)
 Copied from CRM [id number]. Topic: Clinical - Request for Lab/Test Order >> Jun 06, 2024  8:04 AM Allyne Areola wrote: Reason for CRM: Patient is scheduled for a 6 month f/u tomorrow with Dr.Joihn and usually does labs the day before. He would like to have labs done today if possible.

## 2024-06-06 NOTE — Telephone Encounter (Signed)
 Pt arrived for labs 6/16 at 9:45, did not wait for response to phone note. Lab orders from 11/2023 still valid, will complete those.

## 2024-06-07 ENCOUNTER — Encounter: Payer: Self-pay | Admitting: Internal Medicine

## 2024-06-07 ENCOUNTER — Ambulatory Visit (INDEPENDENT_AMBULATORY_CARE_PROVIDER_SITE_OTHER): Payer: Medicare Other | Admitting: Internal Medicine

## 2024-06-07 VITALS — BP 128/84 | HR 82 | Temp 98.2°F | Ht 75.0 in | Wt 269.0 lb

## 2024-06-07 DIAGNOSIS — N320 Bladder-neck obstruction: Secondary | ICD-10-CM

## 2024-06-07 DIAGNOSIS — E7800 Pure hypercholesterolemia, unspecified: Secondary | ICD-10-CM

## 2024-06-07 DIAGNOSIS — M48061 Spinal stenosis, lumbar region without neurogenic claudication: Secondary | ICD-10-CM | POA: Insufficient documentation

## 2024-06-07 DIAGNOSIS — E538 Deficiency of other specified B group vitamins: Secondary | ICD-10-CM

## 2024-06-07 DIAGNOSIS — Z7985 Long-term (current) use of injectable non-insulin antidiabetic drugs: Secondary | ICD-10-CM

## 2024-06-07 DIAGNOSIS — E559 Vitamin D deficiency, unspecified: Secondary | ICD-10-CM | POA: Diagnosis not present

## 2024-06-07 DIAGNOSIS — E1165 Type 2 diabetes mellitus with hyperglycemia: Secondary | ICD-10-CM | POA: Diagnosis not present

## 2024-06-07 DIAGNOSIS — I10 Essential (primary) hypertension: Secondary | ICD-10-CM

## 2024-06-07 DIAGNOSIS — M8008XD Age-related osteoporosis with current pathological fracture, vertebra(e), subsequent encounter for fracture with routine healing: Secondary | ICD-10-CM | POA: Insufficient documentation

## 2024-06-07 NOTE — Progress Notes (Signed)
 Patient ID: Danny [name], male   DOB: Nov 19, 1951, 73 y.o.   MRN: [id number]        Chief Complaint: follow up yearly exam       HPI:  Danny [name] is a 73 y.o. male here overall doing ok,  Pt denies chest pain, increased sob or doe, wheezing, orthopnea, PND, increased LE swelling, palpitations, dizziness or syncope.   Pt denies polydipsia, polyuria, or new focal neuro s/s.    Pt denies fever, wt loss, night sweats, loss of appetite, or other constitutional symptoms   Back pain improved with PT and is continuing the excercises at home.  Did have IV tx for osteoporosis and taking supplements as wel.  Back to the gym recently.  Has trip to [place] soon.    Wt Readings from Last 3 Encounters:  06/07/24 269 lb (122 kg)  01/25/24 266 lb (120.7 kg)  12/31/23 264 lb (119.7 kg)   BP Readings from Last 3 Encounters:  06/07/24 128/84  01/25/24 118/80  12/01/23 138/88         Past Medical History:  Diagnosis Date   Allergic rhinitis, cause unspecified 03/12/2012   Allergy    SEASONAL   Anxiety    Diabetes (HCC) 06/26/2008   Centricity Description: DM Qualifier: Diagnosis of  By: Autry Legions MD, Alveda Aures  Centricity Description: DIABETES MELLITUS, TYPE II Qualifier: Diagnosis of  By: Autry Legions MD, Alveda Aures    DIVERTICULOSIS, COLON 07/05/2008   DM w/o Complication Type II 06/26/2008   HYPERLIPIDEMIA 06/27/2008   HYPERTENSION 06/27/2008   Kidney stones    Mini stroke    OBSTRUCTIVE SLEEP APNEA 06/26/2008   Sleep apnea    CPAP   Stroke [facility])    MINI   Past Surgical History:  Procedure Laterality Date   COLONOSCOPY     COSMETIC SURGERY     NOSE SURGERY  1970   cosmetic   TONSILLECTOMY      reports that he has never smoked. He has never used smokeless tobacco. He reports that he does not currently use alcohol. He reports that he does not use drugs. family history includes Cancer in his maternal grandfather and maternal grandmother; Emphysema in his paternal uncle; Heart disease in his father. Allergies   Allergen Reactions   Penicillins Other (See Comments)    He is not sure what the reaction   Current Outpatient Medications on File Prior to Visit  Medication Sig Dispense Refill   amLODipine  (NORVASC ) 10 MG tablet TAKE 1 TABLET(10 MG) BY MOUTH DAILY 90 tablet 3   aspirin 325 MG tablet Take 325 mg by mouth daily.     atorvastatin  (LIPITOR) 10 MG tablet Take 1 tablet (10 mg total) by mouth daily. 90 tablet 1   Calcium  Carb-Cholecalciferol (CALTRATE 600+D3) 600-20 MG-MCG TABS      Coenzyme Q10 (CO Q 10 PO) Take by mouth.     loratadine  (CLARITIN ) 10 MG tablet Take 1 tablet (10 mg total) by mouth as needed. 90 tablet 1   losartan  (COZAAR ) 100 MG tablet TAKE 1 TABLET(100 MG) BY MOUTH DAILY 90 tablet 3   magnesium  (MAGTAB) 84 MG ( ) TBCR SR tablet Take 84 mg by mouth.     Omega-3 Fatty Acids (FISH OIL ) 500 MG CAPS Take by mouth 2 (two) times daily.     predniSONE  (DELTASONE ) 10 MG tablet Take 4 tablets (40 mg total) by mouth daily with breakfast for 4 days, THEN 3 tablets (30 mg total) daily with breakfast  for 4 days, THEN 2 tablets (20 mg total) daily with breakfast for 3 days, THEN 1 tablet (10 mg total) daily with breakfast for 3 days. 37 tablet 0   Semaglutide , 2 MG/DOSE, 8 MG/3ML SOPN Inject 2 mg as directed once a week. 9 mL 3   silver  sulfADIAZINE  (SILVADENE ) 1 % cream Apply 1 application topically daily. 50 g 1   vitamin k 100 MCG tablet Take 100 mcg by mouth daily.     No current facility-administered medications on file prior to visit.        ROS:  All others reviewed and negative.  Objective        PE:  BP 128/84 (BP Location: Left Arm, Patient Position: Sitting, Cuff Size: Normal)   Pulse 82   Temp 98.2 F (36.8 C) (Oral)   Ht 6' 3 (1.905 m)   Wt 269 lb (122 kg)   SpO2 98%   BMI 33.62 kg/m                 Constitutional: Pt appears in NAD               HENT: Head: NCAT.                Right Ear: External ear normal.                 Left Ear: External ear normal.                 Eyes: . Pupils are equal, round, and reactive to light. Conjunctivae and EOM are normal               Nose: without d/c or deformity               Neck: Neck supple. Gross normal ROM               Cardiovascular: Normal rate and regular rhythm.                 Pulmonary/Chest: Effort normal and breath sounds without rales or wheezing.                Abd:  Soft, NT, ND, + BS, no organomegaly               Neurological: Pt is alert. At baseline orientation, motor grossly intact               Skin: Skin is warm. No rashes, no other new lesions, LE edema - trace bilateral               Psychiatric: Pt behavior is normal without agitation   Micro: none  Cardiac tracings I have personally interpreted today:  none  Pertinent Radiological findings (summarize): none   Lab Results  Component Value Date   WBC 5.1 11/30/2023   HGB 14.9 11/30/2023   HCT 44.6 11/30/2023   PLT 174.0 11/30/2023   GLUCOSE 94 06/06/2024   CHOL 112 06/06/2024   TRIG 64.0 06/06/2024   HDL 45.80 06/06/2024   LDLCALC 53 06/06/2024   ALT 22 06/06/2024   AST 18 06/06/2024   NA 139 06/06/2024   K 3.7 06/06/2024   CL 105 06/06/2024   CREATININE 0.97 06/06/2024   BUN 18 06/06/2024   CO2 25 06/06/2024   TSH 2.26 11/30/2023   PSA 1.48 06/06/2024   HGBA1C 6.1 06/06/2024   MICROALBUR 0.9 11/30/2023   Assessment/Plan:  Danny [name] is a 73 y.o.  White or Caucasian [1] male with  has a past medical history of Allergic rhinitis, cause unspecified (03/12/2012), Allergy, Anxiety, Diabetes (HCC) (06/26/2008), DIVERTICULOSIS, COLON (07/05/2008), DM w/o Complication Type II (06/26/2008), HYPERLIPIDEMIA (06/27/2008), HYPERTENSION (06/27/2008), Kidney stones, Mini stroke, OBSTRUCTIVE SLEEP APNEA (06/26/2008), Sleep apnea, and Stroke (HCC).  Diabetes (HCC) Lab Results  Component Value Date   HGBA1C 6.1 06/06/2024   Stable, pt to continue current medical treatment ozempic  2 mg weekly   Hyperlipidemia Lab Results   Component Value Date   LDLCALC 53 06/06/2024   Stable, pt to continue current statin lipitor 10 qd   Hypertension, uncontrolled BP Readings from Last 3 Encounters:  06/07/24 128/84  01/25/24 118/80  12/01/23 138/88   Stable, pt to continue medical treatment norvasc  10 every day, losartan  100 qd   Vitamin D  deficiency Last vitamin D  Lab Results  Component Value Date   VD25OH 44 01/25/2024   Stable, cont oral replacement  Followup: Return in about 6 months (around 12/07/2024).  Rosalia Colonel, MD 06/08/2024 9:14 PM [facility]

## 2024-06-07 NOTE — Patient Instructions (Signed)
Please continue all other medications as before, and refills have been done if requested.  Please have the pharmacy call with any other refills you may need.  Please continue your efforts at being more active, low cholesterol diet, and weight control.  You are otherwise up to date with prevention measures today.  Please keep your appointments with your specialists as you may have planned  Please make an Appointment to return in 6 months, or sooner if needed, also with Lab Appointment for testing done 3-5 days before at the FIRST FLOOR Lab (so this is for TWO appointments - please see the scheduling desk as you leave)  

## 2024-06-08 ENCOUNTER — Encounter: Payer: Self-pay | Admitting: Internal Medicine

## 2024-06-08 NOTE — Assessment & Plan Note (Signed)
 Lab Results  Component Value Date   LDLCALC 53 06/06/2024   Stable, pt to continue current statin lipitor 10 qd

## 2024-06-08 NOTE — Assessment & Plan Note (Signed)
 Last vitamin D  Lab Results  Component Value Date   VD25OH 44 01/25/2024   Stable, cont oral replacement

## 2024-06-08 NOTE — Assessment & Plan Note (Signed)
 BP Readings from Last 3 Encounters:  06/07/24 128/84  01/25/24 118/80  12/01/23 138/88   Stable, pt to continue medical treatment norvasc  10 every day, losartan  100 qd

## 2024-06-08 NOTE — Assessment & Plan Note (Signed)
 Lab Results  Component Value Date   HGBA1C 6.1 06/06/2024   Stable, pt to continue current medical treatment ozempic  2 mg weekly

## 2024-06-21 ENCOUNTER — Ambulatory Visit: Payer: Medicare Other

## 2024-07-10 ENCOUNTER — Encounter: Payer: Self-pay | Admitting: Internal Medicine

## 2024-07-15 ENCOUNTER — Telehealth: Payer: Self-pay

## 2024-07-15 DIAGNOSIS — G4733 Obstructive sleep apnea (adult) (pediatric): Secondary | ICD-10-CM

## 2024-07-15 NOTE — Addendum Note (Signed)
 Addended by: ARMAND SOR R on: 07/15/2024 11:35 AM   Modules accepted: Orders

## 2024-07-15 NOTE — Telephone Encounter (Signed)
 RX signed and faxed to [facility]     NFN-

## 2024-07-15 NOTE — Telephone Encounter (Signed)
 Copied from CRM [id number]. Topic: Clinical - Order For Equipment >> Jul 12, 2024 12:55 PM Leila C wrote: Reason for CRM: Patient [id number] last saw Dr. Neda 06/13/22 and wants to purchase a travel cpap machine. Patient states not having issues right now and needs a machine to travel with. Please advise and call back. >> Jul 14, 2024  1:39 PM Russell PARAS wrote: Pt contacting clinic again to get update concerning his request for travel CPAP machine. He is planning to travel in September.   Requested call back  CB#  [phone]    Spoke with patient he is aware that he has to pay out of pocket for another machine   Script sent to DME -   [facility]-

## 2024-07-18 ENCOUNTER — Telehealth: Payer: Self-pay | Admitting: Pulmonary Disease

## 2024-07-18 NOTE — Telephone Encounter (Signed)
 Copied from CRM [id number]. Topic: Clinical - Order For Equipment >> Jul 12, 2024 12:55 PM Leila C wrote: Reason for CRM: Patient [phone] last saw Dr. Neda 06/13/22 and wants to purchase a travel cpap machine. Patient states not having issues right now and needs a machine to travel with. Please advise and call back. >> Jul 18, 2024  4:13 PM Joesph PARAS wrote: Patient calling once again. Informed patient that rx for another machine has been written, signed, and faxed to [facility], his assigned DME company. Patient states that is not what he wanted and wants a hand-written prescription. Informed patient that hand-written prescriptions are not common and that we would be happy to fax to a DME of his choice if he would indicate where he would like to go.  >> Jul 14, 2024  1:39 PM Russell J wrote: Pt contacting clinic again to get update concerning his request for travel CPAP machine. He is planning to travel in September.   Requested call back  CB#  [phone]

## 2024-07-19 NOTE — Telephone Encounter (Signed)
 ATCx1 and LVM to call back.

## 2024-07-19 NOTE — Telephone Encounter (Signed)
 Spoke with patient and he states he is still deciding whether he wants a prescription for a  travel CPAP (DME [facility]) or whether he wants to get a battery that would allow him to travel with current CPAP machine. Patient states he will let us  know what he decides and call us  back. Nothing further needed.

## 2024-07-25 ENCOUNTER — Other Ambulatory Visit: Payer: Self-pay | Admitting: Internal Medicine

## 2024-08-08 ENCOUNTER — Encounter: Payer: Self-pay | Admitting: Internal Medicine

## 2024-08-29 DIAGNOSIS — Z23 Encounter for immunization: Secondary | ICD-10-CM | POA: Diagnosis not present

## 2024-09-01 ENCOUNTER — Encounter: Payer: Self-pay | Admitting: Internal Medicine

## 2024-09-09 ENCOUNTER — Encounter: Payer: Self-pay | Admitting: Internal Medicine

## 2024-09-12 MED ORDER — LOSARTAN POTASSIUM 100 MG PO TABS: ORAL_TABLET | ORAL | 3 refills | Status: AC

## 2024-09-12 NOTE — Telephone Encounter (Signed)
 Copied from CRM [id number]. Topic: Clinical - Prescription Issue >> Sep 12, 2024  9:55 AM Dawna HERO wrote: Reason for CRM: pt called about medication losartan  (COZAAR ) 100 MG tablet, says he is leaving tomorrow for [place] and wont be in town to get his meds. Says he needs to have this medication approved for refill by today so he can have it,states its is a very urgent matter . He also messaged provider on Friday stating the situation  [phone]

## 2024-10-11 ENCOUNTER — Encounter: Payer: Self-pay | Admitting: Internal Medicine

## 2024-12-05 ENCOUNTER — Other Ambulatory Visit

## 2024-12-05 DIAGNOSIS — E538 Deficiency of other specified B group vitamins: Secondary | ICD-10-CM | POA: Diagnosis not present

## 2024-12-05 DIAGNOSIS — E1165 Type 2 diabetes mellitus with hyperglycemia: Secondary | ICD-10-CM | POA: Diagnosis not present

## 2024-12-05 DIAGNOSIS — E559 Vitamin D deficiency, unspecified: Secondary | ICD-10-CM | POA: Diagnosis not present

## 2024-12-05 DIAGNOSIS — N320 Bladder-neck obstruction: Secondary | ICD-10-CM

## 2024-12-05 LAB — HEPATIC FUNCTION PANEL
ALT: 24 U/L (ref 0–53)
AST: 21 U/L (ref 0–37)
Albumin: 4.8 g/dL (ref 3.5–5.2)
Alkaline Phosphatase: 46 U/L (ref 39–117)
Bilirubin, Direct: 0.2 mg/dL (ref 0.0–0.3)
Total Bilirubin: 0.7 mg/dL (ref 0.2–1.2)
Total Protein: 6.9 g/dL (ref 6.0–8.3)

## 2024-12-05 LAB — LIPID PANEL
Cholesterol: 109 mg/dL (ref 0–200)
HDL: 43.2 mg/dL (ref >39.00)
LDL Cholesterol: 46 mg/dL (ref 0–99)
NonHDL: 65.75
Total CHOL/HDL Ratio: 3
Triglycerides: 98 mg/dL (ref 0.0–149.0)
VLDL: 19.6 mg/dL (ref 0.0–40.0)

## 2024-12-05 LAB — CBC WITH DIFFERENTIAL/PLATELET
Basophils Absolute: 0 K/uL (ref 0.0–0.1)
Basophils Relative: 0.6 % (ref 0.0–3.0)
Eosinophils Absolute: 0.3 K/uL (ref 0.0–0.7)
Eosinophils Relative: 5.3 % — ABNORMAL HIGH (ref 0.0–5.0)
HCT: 43.6 % (ref 39.0–52.0)
Hemoglobin: 14.8 g/dL (ref 13.0–17.0)
Lymphocytes Relative: 21.7 % (ref 12.0–46.0)
Lymphs Abs: 1.3 K/uL (ref 0.7–4.0)
MCHC: 33.8 g/dL (ref 30.0–36.0)
MCV: 86.7 fl (ref 78.0–100.0)
Monocytes Absolute: 0.4 K/uL (ref 0.1–1.0)
Monocytes Relative: 7.2 % (ref 3.0–12.0)
Neutro Abs: 3.9 K/uL (ref 1.4–7.7)
Neutrophils Relative %: 65.2 % (ref 43.0–77.0)
Platelets: 203 K/uL (ref 150.0–400.0)
RBC: 5.03 Mil/uL (ref 4.22–5.81)
RDW: 13 % (ref 11.5–15.5)
WBC: 6 K/uL (ref 4.0–10.5)

## 2024-12-05 LAB — BASIC METABOLIC PANEL WITH GFR
BUN: 13 mg/dL (ref 6–23)
CO2: 28 meq/L (ref 19–32)
Calcium: 9.1 mg/dL (ref 8.4–10.5)
Chloride: 102 meq/L (ref 96–112)
Creatinine, Ser: 0.96 mg/dL (ref 0.40–1.50)
GFR: 79.62 mL/min (ref >60.00)
Glucose, Bld: 114 mg/dL — ABNORMAL HIGH (ref 70–99)
Potassium: 3.9 meq/L (ref 3.5–5.1)
Sodium: 140 meq/L (ref 135–145)

## 2024-12-05 LAB — MICROALBUMIN / CREATININE URINE RATIO
Creatinine,U: 253.9 mg/dL
Microalb Creat Ratio: 9 mg/g (ref 0.0–30.0)
Microalb, Ur: 2.3 mg/dL — ABNORMAL HIGH (ref 0.0–1.9)

## 2024-12-05 LAB — URINALYSIS, ROUTINE W REFLEX MICROSCOPIC
Bilirubin Urine: NEGATIVE
Hgb urine dipstick: NEGATIVE
Leukocytes,Ua: NEGATIVE
Nitrite: NEGATIVE
RBC / HPF: NONE SEEN (ref 0–?)
Specific Gravity, Urine: 1.02 (ref 1.000–1.030)
Total Protein, Urine: NEGATIVE
Urine Glucose: NEGATIVE
Urobilinogen, UA: 0.2 (ref 0.0–1.0)
pH: 6 (ref 5.0–8.0)

## 2024-12-05 LAB — HEMOGLOBIN A1C: Hgb A1c MFr Bld: 6 % (ref 4.6–6.5)

## 2024-12-05 LAB — VITAMIN B12: Vitamin B-12: 640 pg/mL (ref 211–911)

## 2024-12-05 LAB — PSA: PSA: 1.58 ng/mL (ref 0.10–4.00)

## 2024-12-05 LAB — TSH: TSH: 1.92 u[IU]/mL (ref 0.35–5.50)

## 2024-12-05 LAB — VITAMIN D 25 HYDROXY (VIT D DEFICIENCY, FRACTURES): VITD: 36.85 ng/mL (ref 30.00–100.00)

## 2024-12-06 ENCOUNTER — Encounter: Payer: Self-pay | Admitting: Internal Medicine

## 2024-12-06 ENCOUNTER — Ambulatory Visit: Admitting: Internal Medicine

## 2024-12-06 VITALS — BP 122/78 | HR 90 | Temp 98.3°F | Ht 75.0 in | Wt 276.0 lb

## 2024-12-06 DIAGNOSIS — E7800 Pure hypercholesterolemia, unspecified: Secondary | ICD-10-CM

## 2024-12-06 DIAGNOSIS — R059 Cough, unspecified: Secondary | ICD-10-CM | POA: Insufficient documentation

## 2024-12-06 DIAGNOSIS — E559 Vitamin D deficiency, unspecified: Secondary | ICD-10-CM

## 2024-12-06 DIAGNOSIS — Z7985 Long-term (current) use of injectable non-insulin antidiabetic drugs: Secondary | ICD-10-CM

## 2024-12-06 DIAGNOSIS — E1165 Type 2 diabetes mellitus with hyperglycemia: Secondary | ICD-10-CM

## 2024-12-06 DIAGNOSIS — R051 Acute cough: Secondary | ICD-10-CM

## 2024-12-06 DIAGNOSIS — I10 Essential (primary) hypertension: Secondary | ICD-10-CM

## 2024-12-06 MED ORDER — DOXYCYCLINE HYCLATE 100 MG PO TABS: 100.0000 mg | ORAL_TABLET | Freq: Two times a day (BID) | ORAL | 0 refills | Status: AC

## 2024-12-06 MED ORDER — PROMETHAZINE-DM 6.25-15 MG/5ML PO SYRP: 5.0000 mL | ORAL_SOLUTION | Freq: Four times a day (QID) | ORAL | 0 refills | Status: AC | PRN

## 2024-12-06 MED ORDER — TIRZEPATIDE 2.5 MG/0.5ML ~~LOC~~ SOAJ: 2.5000 mg | SUBCUTANEOUS | 11 refills | Status: DC

## 2024-12-06 NOTE — Progress Notes (Signed)
 Patient ID: Danny [name], male   DOB: February 28, 1951, 73 y.o.   MRN: [id number]        Chief Complaint: follow up HTN, HLD. DM/hyperglycemia, low vit d, acute cough       HPI:  Danny [name] is a 73 y.o. male here overall doing ok but has 2 -3 wks ST, HA, general weakness and malaise, with prod cough greenish sputum, but Pt denies chest pain, increased sob or doe, wheezing, orthopnea, PND, increased LE swelling, palpitations, dizziness or syncope.    Pt denies polydipsia, polyuria, or new focal neuro s/s.    Pt denies fever, wt loss, night sweats, loss of appetite, or other constitutional symptoms         Wt Readings from Last 3 Encounters:  12/06/24 276 lb (125.2 kg)  06/07/24 269 lb (122 kg)  01/25/24 266 lb (120.7 kg)   BP Readings from Last 3 Encounters:  12/06/24 122/78  06/07/24 128/84  01/25/24 118/80         Past Medical History:  Diagnosis Date   Allergic rhinitis, cause unspecified 03/12/2012   Allergy    SEASONAL   Anxiety    Diabetes (HCC) 06/26/2008   Centricity Description: DM Qualifier: Diagnosis of  By: Norleen MD, Lynwood ORN  Centricity Description: DIABETES MELLITUS, TYPE II Qualifier: Diagnosis of  By: Norleen MD, Lynwood ORN    DIVERTICULOSIS, COLON 07/05/2008   DM w/o Complication Type II 06/26/2008   HYPERLIPIDEMIA 06/27/2008   HYPERTENSION 06/27/2008   Kidney stones    Mini stroke    OBSTRUCTIVE SLEEP APNEA 06/26/2008   Sleep apnea    CPAP   Stroke [facility])    MINI   Past Surgical History:  Procedure Laterality Date   COLONOSCOPY     COSMETIC SURGERY     NOSE SURGERY  1970   cosmetic   TONSILLECTOMY      reports that he has never smoked. He has never used smokeless tobacco. He reports that he does not currently use alcohol. He reports that he does not use drugs. family history includes Cancer in his maternal grandfather and maternal grandmother; Emphysema in his paternal uncle; Heart disease in his father. Allergies[1] Medications Ordered Prior to Encounter[2]      ROS:   All others reviewed and negative.  Objective        PE:  BP 122/78 (BP Location: Right Arm, Patient Position: Sitting, Cuff Size: Normal)   Pulse 90   Temp 98.3 F (36.8 C) (Oral)   Ht 6' 3 (1.905 m)   Wt 276 lb (125.2 kg)   SpO2 99%   BMI 34.50 kg/m                 Constitutional: Pt appears mild ill               HENT: Head: NCAT.                Right Ear: External ear normal.                 Left Ear: External ear normal. Bilat tm's with mild erythema.  Max sinus areas non tender.  Pharynx with mild erythema, no exudate               Eyes: . Pupils are equal, round, and reactive to light. Conjunctivae and EOM are normal               Nose: without d/c or deformity  Neck: Neck supple. Gross normal ROM               Cardiovascular: Normal rate and regular rhythm.                 Pulmonary/Chest: Effort normal and breath sounds without rales or wheezing.                Abd:  Soft, NT, ND, + BS, no organomegaly               Neurological: Pt is alert. At baseline orientation, motor grossly intact               Skin: Skin is warm. No rashes, no other new lesions, LE edema - none               Psychiatric: Pt behavior is normal without agitation   Micro: none  Cardiac tracings I have personally interpreted today:  none  Pertinent Radiological findings (summarize): none   Lab Results  Component Value Date   WBC 6.0 12/05/2024   HGB 14.8 12/05/2024   HCT 43.6 12/05/2024   PLT 203.0 12/05/2024   GLUCOSE 114 (H) 12/05/2024   CHOL 109 12/05/2024   TRIG 98.0 12/05/2024   HDL 43.20 12/05/2024   LDLCALC 46 12/05/2024   ALT 24 12/05/2024   AST 21 12/05/2024   NA 140 12/05/2024   K 3.9 12/05/2024   CL 102 12/05/2024   CREATININE 0.96 12/05/2024   BUN 13 12/05/2024   CO2 28 12/05/2024   TSH 1.92 12/05/2024   PSA 1.58 12/05/2024   HGBA1C 6.0 12/05/2024   MICROALBUR 2.3 (H) 12/05/2024   Assessment/Plan:  Danny [name] is a 73 y.o. White or Caucasian [1] male  with  has a past medical history of Allergic rhinitis, cause unspecified (03/12/2012), Allergy, Anxiety, Diabetes (HCC) (06/26/2008), DIVERTICULOSIS, COLON (07/05/2008), DM w/o Complication Type II (06/26/2008), HYPERLIPIDEMIA (06/27/2008), HYPERTENSION (06/27/2008), Kidney stones, Mini stroke, OBSTRUCTIVE SLEEP APNEA (06/26/2008), Sleep apnea, and Stroke (HCC).  Vitamin D  deficiency Last vitamin D  Lab Results  Component Value Date   VD25OH 36.85 12/05/2024   Low, to start oral replacement   Hypertension, uncontrolled BP Readings from Last 3 Encounters:  12/06/24 122/78  06/07/24 128/84  01/25/24 118/80   Stable, pt to continue medical treatment norvasc  10 every day, losartan  100 mg qd   Hyperlipidemia Lab Results  Component Value Date   LDLCALC 46 12/05/2024   Stable, pt to continue current statin lipitor 10 mg qd   Type 2 diabetes mellitus with hyperglycemia, without long-term current use of insulin  (HCC) Lab Results  Component Value Date   HGBA1C 6.0 12/05/2024   Has adequate control A1c but unable for further wt loss, pt to continue current medical treatment  ozempic  2 mg weekly as he declines change today, but will consider mounjaro  and let us  know With hyperglycemia, without insulin   Cough Mild to mod, c/w bronchitis vs pna, declines cxr for now, for antibx course doxycycline  100 bid and prom DM prn cough,  to f/u any worsening symptoms or concerns  Followup: Return in about 6 months (around 06/06/2025).  Lynwood Rush, MD 12/06/2024 11:49 AM [facility]    [1]  Allergies Allergen Reactions   Penicillins Other (See Comments)    He is not sure what the reaction  [2]  Current Outpatient Medications on File Prior to Visit  Medication Sig Dispense Refill   amLODipine  (  NORVASC ) 10 MG tablet TAKE 1 TABLET(10 MG) BY MOUTH DAILY 90 tablet 3   aspirin 325 MG tablet Take 325 mg by mouth daily.     atorvastatin   (LIPITOR) 10 MG tablet TAKE 1 TABLET(10 MG) BY MOUTH DAILY 90 tablet 1   Calcium  Carb-Cholecalciferol (CALTRATE 600+D3) 600-20 MG-MCG TABS      Coenzyme Q10 (CO Q 10 PO) Take by mouth.     loratadine  (CLARITIN ) 10 MG tablet Take 1 tablet (10 mg total) by mouth as needed. 90 tablet 1   losartan  (COZAAR ) 100 MG tablet TAKE 1 TABLET(100 MG) BY MOUTH DAILY 90 tablet 3   magnesium  (MAGTAB) 84 MG ( ) TBCR SR tablet Take 84 mg by mouth.     Omega-3 Fatty Acids (FISH OIL ) 500 MG CAPS Take by mouth 2 (two) times daily.     Semaglutide , 2 MG/DOSE, 8 MG/3ML SOPN Inject 2 mg as directed once a week. 9 mL 3   silver  sulfADIAZINE  (SILVADENE ) 1 % cream Apply 1 application topically daily. 50 g 1   vitamin k 100 MCG tablet Take 100 mcg by mouth daily.     No current facility-administered medications on file prior to visit.

## 2024-12-06 NOTE — Assessment & Plan Note (Signed)
 BP Readings from Last 3 Encounters:  12/06/24 122/78  06/07/24 128/84  01/25/24 118/80   Stable, pt to continue medical treatment norvasc  10 every day, losartan  100 mg qd

## 2024-12-06 NOTE — Assessment & Plan Note (Signed)
 Mild to mod, c/w bronchitis vs pna, declines cxr for now, for antibx course doxycycline  100 bid and prom DM prn cough,  to f/u any worsening symptoms or concerns

## 2024-12-06 NOTE — Patient Instructions (Addendum)
 Please consider change ozempic  to mounjaro    Please take OTC Vitamin D3 at 2000 units per day, indefinitely  Please take all new medication as prescribed - the antibiotic, and cough medicine  Please continue all other medications as before, and refills have been done if requested.  Please have the pharmacy call with any other refills you may need.  Please keep your appointments with your specialists as you may have planned  Your lab testing was good!  Please make an Appointment to return in 6 months, or sooner if needed

## 2024-12-06 NOTE — Assessment & Plan Note (Addendum)
 Lab Results  Component Value Date   HGBA1C 6.0 12/05/2024   Has adequate control A1c but unable for further wt loss, pt to continue current medical treatment  ozempic  2 mg weekly as he declines change today, but will consider mounjaro  and let us  know With hyperglycemia, without insulin 

## 2024-12-06 NOTE — Assessment & Plan Note (Signed)
 Lab Results  Component Value Date   LDLCALC 46 12/05/2024   Stable, pt to continue current statin lipitor 10 mg qd

## 2024-12-06 NOTE — Assessment & Plan Note (Signed)
 Last vitamin D  Lab Results  Component Value Date   VD25OH 36.85 12/05/2024   Low, to start oral replacement

## 2024-12-14 ENCOUNTER — Encounter: Payer: Self-pay | Admitting: Internal Medicine

## 2024-12-26 ENCOUNTER — Encounter: Payer: Self-pay | Admitting: "Endocrinology

## 2024-12-26 ENCOUNTER — Ambulatory Visit: Payer: Medicare Other | Admitting: "Endocrinology

## 2024-12-26 VITALS — BP 140/80 | HR 87 | Ht 75.0 in | Wt 276.0 lb

## 2024-12-26 DIAGNOSIS — M810 Age-related osteoporosis without current pathological fracture: Secondary | ICD-10-CM

## 2024-12-26 NOTE — Progress Notes (Signed)
 "   [facility] Note Danny Birmingham, MD    Referring Provider: Norleen Lynwood ORN, MD Primary Care Provider: Norleen Lynwood ORN, MD No chief complaint on file.   Assessment & Plan  Diagnoses and all orders for this visit:  Age-related osteoporosis without current pathological fracture -     DG BONE DENSITY (DXA); Future -     DG BONE DENSITY (DXA) -     VITAMIN D  25 Hydroxy (Vit-D Deficiency, Fractures) -     Renal function panel   Likely age-related. BMD results suggest: -2.5 distal radius in 11/2022. He is currently taking two tabs of caltrate (Calcium  600 mg twice a day and vitamin D  800 international units twice daily). Plus a MCI with 200mg  of calcium  and Vit D 1,600 international Units.  Continue weight bearing exercise options and dietary supplements. Previously had long discussion of pros/cons of treatment and treatment modalities.   Patient never started fosamax , preferred and changed to reclast  s/p first infusion on 06/16/23. Patient recalls having some side effects for 3 days post reclast , doesn't recall details. Next reclast  dose in 05/2024 ordered.  Patient did not take the Reclast  and now wants to switch to Fosamax  instead.  Discussed the side effects.  Patient understands and reports no GERD/esophagitis.  Patient handout attached. Will send the prescription if patient is still osteoporotic based off of current DEXA scan.  Patient in agreement  Patient would like to get tested for Ca, Mg and Vit D, taking supplements  + MVI.  Follow fall precautions, adequate dairy in diet and exercises (aerobic, balancing and weight bearing) as tolerated.  Next DXA due in 12/2024. Ordered it.    I have reviewed current medications, nurse's notes, allergies, vital signs, past medical and surgical history, family medical history, and social history for this encounter. Counseled patient on symptoms, examination findings, lab findings, imaging results, treatment decisions and monitoring and  prognosis. The patient understood the recommendations and agrees with the treatment plan. All questions regarding treatment plan were fully answered.   Return in about 4 months (around 04/25/2025) for visit + labs before next visit.   Danny Birmingham, MD   12/26/2024    History of Present Illness Danny [name] is a 74 y.o. year old male who presents to follow up for osteoporosis diagnosed in 11/2022. BMD completed on 11/2022  suggested osteoporosis. He is currently taking Calcium  600 mg twice a day and vitamin D  800 international units twice daily. Plus a MCI with 200mg  of calcium  and Vit D 1,600 International Units.  Denies any falls/fractures.  Back pain is improved now Back to exercise/walking   Risk Factors screening:  History of low trauma fractures: No Family history of osteoporosis: No Hip fracture in first-degree relatives: No Smoking history: No Excessive alcohol intake >2 drinks/day: No Excessive caffeine intake >2 drinks/day: Yes Glucocorticoid use >5mg  prednisone /day for >3 months: No Rheumatoid arthritis history: No  Results:11/2022 DXA   Lumbar spine L3-L4 Femoral neck (FN) Ultra distal radius  T-score   -1.8 RFN: -1.7 LFN: -1.3 -2.5    Physical Exam  BP (!) 140/80   Pulse 87   Ht 6' 3 (1.905 m)   Wt 276 lb (125.2 kg)   SpO2 96%   BMI 34.50 kg/m  Constitutional: well developed, well nourished Head: normocephalic, atraumatic Eyes: sclera anicteric, no redness Neck: supple Lungs: normal respiratory effort Neurology: alert and oriented Skin: dry, no appreciable rashes Musculoskeletal: no appreciable defects Psychiatric: normal mood and affect  Allergies Allergies  Allergen Reactions   Penicillins Other (See Comments)    He is not sure what the reaction    Current Medications Patient's Medications  New Prescriptions   No medications on file  Previous Medications   AMLODIPINE  (NORVASC ) 10 MG TABLET    TAKE 1 TABLET(10 MG) BY MOUTH DAILY   ASPIRIN  325 MG TABLET    Take 325 mg by mouth daily.   ATORVASTATIN  (LIPITOR) 10 MG TABLET    TAKE 1 TABLET(10 MG) BY MOUTH DAILY   CALCIUM  CARB-CHOLECALCIFEROL (CALTRATE 600+D3) 600-20 MG-MCG TABS       COENZYME Q10 (CO Q 10 PO)    Take by mouth.   DOXYCYCLINE  (VIBRA -TABS) 100 MG TABLET    Take 1 tablet (100 mg total) by mouth 2 (two) times daily.   LORATADINE  (CLARITIN ) 10 MG TABLET    Take 1 tablet (10 mg total) by mouth as needed.   LOSARTAN  (COZAAR ) 100 MG TABLET    TAKE 1 TABLET(100 MG) BY MOUTH DAILY   MAGNESIUM  (MAGTAB) 84 MG ( ) TBCR SR TABLET    Take 84 mg by mouth.   OMEGA-3 FATTY ACIDS (FISH OIL ) 500 MG CAPS    Take by mouth 2 (two) times daily.   PROMETHAZINE -DEXTROMETHORPHAN (PROMETHAZINE -DM) 6.25-15 MG/5ML SYRUP    Take 5 mLs by mouth 4 (four) times daily as needed.   SILVER  SULFADIAZINE  (SILVADENE ) 1 % CREAM    Apply 1 application topically daily.   TIRZEPATIDE  (MOUNJARO ) 2.5 MG/0.5ML PEN    Inject 2.5 mg into the skin once a week.   VITAMIN K 100 MCG TABLET    Take 100 mcg by mouth daily.  Modified Medications   No medications on file  Discontinued Medications   No medications on file     Past Medical History Past Medical History:  Diagnosis Date   Allergic rhinitis, cause unspecified 03/12/2012   Allergy    SEASONAL   Anxiety    Diabetes (HCC) 06/26/2008   Centricity Description: DM Qualifier: Diagnosis of  By: Norleen MD, Lynwood ORN  Centricity Description: DIABETES MELLITUS, TYPE II Qualifier: Diagnosis of  By: Norleen MD, Lynwood ORN    DIVERTICULOSIS, COLON 07/05/2008   DM w/o Complication Type II 06/26/2008   HYPERLIPIDEMIA 06/27/2008   HYPERTENSION 06/27/2008   Kidney stones    Mini stroke    OBSTRUCTIVE SLEEP APNEA 06/26/2008   Sleep apnea    CPAP   Stroke [facility])    MINI    Past Surgical History Past Surgical History:  Procedure Laterality Date   COLONOSCOPY     COSMETIC SURGERY     NOSE SURGERY  1970   cosmetic   TONSILLECTOMY      Family History family history includes  Cancer in his maternal grandfather and maternal grandmother; Emphysema in his paternal uncle; Heart disease in his father.  Social History Social History   Socioeconomic History   Marital status: Married    Spouse name: Karna   Number of children: 2   Years of education: Not on file   Highest education level: Bachelor's degree (e.g., BA, AB, BS)  Occupational History   Occupation: RETIRED/POSTAL Dentist: [employer]  Tobacco Use   Smoking status: Never   Smokeless tobacco: Never  Vaping Use   Vaping status: Never Used  Substance and Sexual Activity   Alcohol use: Not Currently    Comment: 0-2 per day   Drug use: No   Sexual activity: Not on  file  Other Topics Concern   Not on file  Social History Narrative   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 He's married 2 daughters. 0-2 alcoholic drinks a day 0-2 caffeinated beverages a day.04/18/2016      Live with wife-2025   Social Drivers of Health   Tobacco Use: Low Risk (12/26/2024)   Patient History    Smoking Tobacco Use: Never    Smokeless Tobacco Use: Never    Passive Exposure: Not on file  Financial Resource Strain: Low Risk (12/31/2023)   Overall Financial Resource Strain (CARDIA)    Difficulty of Paying Living Expenses: Not hard at all  Food Insecurity: No Food Insecurity (12/31/2023)   Hunger Vital Sign    Worried About Running Out of Food in the Last Year: Never true    Ran Out of Food in the Last Year: Never true  Transportation Needs: No Transportation Needs (12/31/2023)   PRAPARE - Administrator, Civil Service (Medical): No    Lack of Transportation (Non-Medical): No  Physical Activity: Sufficiently Active (12/31/2023)   Exercise Vital Sign    Days of Exercise per Week: 5 days    Minutes of Exercise per Session: 60 min  Stress: No Stress Concern Present (12/31/2023)   [employer] of Occupational Health - Occupational  Stress Questionnaire    Feeling of Stress : Not at all  Social Connections: Moderately Isolated (12/31/2023)   Social Connection and Isolation Panel    Frequency of Communication with Friends and Family: More than three times a week    Frequency of Social Gatherings with Friends and Family: More than three times a week    Attends Religious Services: Never    Database Administrator or Organizations: No    Attends Banker Meetings: Never    Marital Status: Married  Catering Manager Violence: Not At Risk (12/31/2023)   Humiliation, Afraid, Rape, and Kick questionnaire    Fear of Current or Ex-Partner: No    Emotionally Abused: No    Physically Abused: No    Sexually Abused: No  Depression (PHQ2-9): Low Risk (12/06/2024)   Depression (PHQ2-9)    PHQ-2 Score: 0  Alcohol Screen: Low Risk (12/31/2023)   Alcohol Screen    Last Alcohol Screening Score (AUDIT): 0  Housing: Unknown (12/31/2023)   Housing Stability Vital Sign    Unable to Pay for Housing in the Last Year: No    Number of Times Moved in the Last Year: Not on file    Homeless in the Last Year: No  Utilities: Not At Risk (12/31/2023)   AHC Utilities    Threatened with loss of utilities: No  Health Literacy: Adequate Health Literacy (12/31/2023)   B1300 Health Literacy    Frequency of need for help with medical instructions: Never    Laboratory Investigations No components found for: CMP No components found for: BMP Lab Results  Component Value Date   GFR 79.62 12/05/2024   Lab Results  Component Value Date   CREATININE 0.96 12/05/2024   Component     Latest Ref Rng 05/15/2023     Calcium      8.4 - 10.5 mg/dL 9.0   VITD     69.99 - 100.00 ng/mL 32.60         TSH     0.35 - 5.50 uIU/mL 2.30     Lab Results  Component Value Date   TSH 1.92  12/05/2024    Parts of this note may have been dictated using voice recognition software. There may be variances in spelling and vocabulary which are  unintentional. Not all errors are proofread. Please notify the dino if any discrepancies are noted or if the meaning of any statement is not clear.  "

## 2025-01-02 ENCOUNTER — Telehealth: Payer: Self-pay

## 2025-01-02 NOTE — Telephone Encounter (Signed)
 Copied from CRM #[id number]. Topic: Clinical - Medication Question >> Jan 02, 2025  2:26 PM Deaijah H wrote: Reason for CRM: Patient called in stating he's needing the Dr. Norleen to put in a fill for the next step of tirzepatide  (MOUNJARO ) 2.5 MG/0.5ML Pen to 5 mg  4 of them for the next month.

## 2025-01-03 ENCOUNTER — Ambulatory Visit: Payer: BC Managed Care – PPO

## 2025-01-03 VITALS — Ht 74.0 in | Wt 276.0 lb

## 2025-01-03 DIAGNOSIS — Z0000 Encounter for general adult medical examination without abnormal findings: Secondary | ICD-10-CM

## 2025-01-03 NOTE — Patient Instructions (Signed)
 [name],  Thank you for taking the time for your Medicare Wellness Visit. I appreciate your continued commitment to your health goals. Please review the care plan we discussed, and feel free to reach out if I can assist you further.  Please note that Annual Wellness Visits do not include a physical exam. Some assessments may be limited, especially if the visit was conducted virtually. If needed, we may recommend an in-person follow-up with your provider.  Ongoing Care Seeing your primary care provider every 3 to 6 months helps us  monitor your health and provide consistent, personalized care.   Referrals If a referral was made during today's visit and you haven't received any updates within two weeks, please contact the referred provider directly to check on the status.  Recommended Screenings:  Health Maintenance  Topic Date Due   Eye exam for diabetics  12/01/2024   Hemoglobin A1C  06/05/2025   Yearly kidney function blood test for diabetes  12/05/2025   Yearly kidney health urinalysis for diabetes  12/05/2025   Complete foot exam   12/06/2025   Medicare Annual Wellness Visit  01/03/2026   Colon Cancer Screening  05/16/2026   DTaP/Tdap/Td vaccine (4 - Td or Tdap) 12/04/2026   Pneumococcal Vaccine for age over 96  Completed   Flu Shot  Completed   Hepatitis C Screening  Completed   Zoster (Shingles) Vaccine  Completed   Meningitis B Vaccine  Aged Out   COVID-19 Vaccine  Discontinued       01/03/2025   10:55 AM  Advanced Directives  Does Patient Have a Medical Advance Directive? No  Would patient like information on creating a medical advance directive? No - Patient declined    Vision: Annual vision screenings are recommended for early detection of glaucoma, cataracts, and diabetic retinopathy. These exams can also reveal signs of chronic conditions such as diabetes and high blood pressure.  Dental: Annual dental screenings help detect early signs of oral cancer, gum  disease, and other conditions linked to overall health, including heart disease and diabetes.

## 2025-01-03 NOTE — Progress Notes (Signed)
 "  Chief Complaint  Patient presents with   Medicare Wellness     Subjective:  Please attest and cosign this visit due to patients primary care provider not being in the office at the time the visit was completed.  (Pt of Dr Lynwood Rush)   Danny [name] is a 74 y.o. male who presents for a Medicare Annual Wellness Visit.  Visit info / Clinical Intake: Medicare Wellness Visit Type:: Subsequent Annual Wellness Visit Persons participating in visit and providing information:: patient Medicare Wellness Visit Mode:: Telephone If telephone:: video declined Since this visit was completed virtually, some vitals may be partially provided or unavailable. Missing vitals are due to the limitations of the virtual format.: Documented vitals are patient reported If Telephone or Video please confirm:: I connected with patient using audio/video enable telemedicine. I verified patient identity with two identifiers, discussed telehealth limitations, and patient agreed to proceed. Patient Location:: Home Provider Location:: Office Interpreter Needed?: No Pre-visit prep was completed: yes AWV questionnaire completed by patient prior to visit?: yes Date:: 01/02/25 Living arrangements:: lives with spouse/significant other Patient's Overall Health Status Rating: very good Typical amount of pain: none Does pain affect daily life?: no Are you currently prescribed opioids?: no  Dietary Habits and Nutritional Risks How many meals a day?: 3 Eats fruit and vegetables daily?: yes Most meals are obtained by: preparing own meals In the last 2 weeks, have you had any of the following?: none Diabetic:: (!) yes Any non-healing wounds?: no How often do you check your BS?: 0 Would you like to be referred to a Nutritionist or for Diabetic Management? : no  Functional Status Activities of Daily Living (to include ambulation/medication): Independent Ambulation: Independent with device- listed below Home Assistive  Devices/Equipment: Eyeglasses; CPAP Medication Administration: Independent Home Management (perform basic housework or laundry): Independent Manage your own finances?: yes Primary transportation is: driving Concerns about vision?: no *vision screening is required for WTM* Concerns about hearing?: no  Fall Screening Falls in the past year?: 0 Number of falls in past year: 0 Was there an injury with Fall?: 0 Fall Risk Category Calculator: 0 Patient Fall Risk Level: Low Fall Risk  Fall Risk Patient at Risk for Falls Due to: No Fall Risks Fall risk Follow up: Falls evaluation completed; Falls prevention discussed  Home and Transportation Safety: All rugs have non-skid backing?: yes All stairs or steps have railings?: yes Grab bars in the bathtub or shower?: yes Have non-skid surface in bathtub or shower?: yes Good home lighting?: yes Regular seat belt use?: yes Hospital stays in the last year:: no  Cognitive Assessment Difficulty concentrating, remembering, or making decisions? : no Will 6CIT or Mini Cog be Completed: yes What year is it?: 0 points What month is it?: 0 points Give patient an address phrase to remember (5 components): [address] [place], [place] About what time is it?: 0 points Count backwards from 20 to 1: 0 points Say the months of the year in reverse: 0 points Repeat the address phrase from earlier: 0 points 6 CIT Score: 0 points  Advance Directives (For Healthcare) Does Patient Have a Medical Advance Directive?: No Would patient like information on creating a medical advance directive?: No - Patient declined  Reviewed/Updated  Reviewed/Updated: Reviewed All (Medical, Surgical, Family, Medications, Allergies, Care Teams, Patient Goals)    Allergies (verified) Penicillins   Current Medications (verified) Outpatient Encounter Medications as of 01/03/2025  Medication Sig   amLODipine  (NORVASC ) 10 MG tablet TAKE 1 TABLET(10  MG) BY MOUTH DAILY    aspirin 325 MG tablet Take 325 mg by mouth daily.   atorvastatin  (LIPITOR) 10 MG tablet TAKE 1 TABLET(10 MG) BY MOUTH DAILY   Calcium  Carb-Cholecalciferol (CALTRATE 600+D3) 600-20 MG-MCG TABS    Coenzyme Q10 (CO Q 10 PO) Take by mouth.   loratadine  (CLARITIN ) 10 MG tablet Take 1 tablet (10 mg total) by mouth as needed.   losartan  (COZAAR ) 100 MG tablet TAKE 1 TABLET(100 MG) BY MOUTH DAILY   magnesium  (MAGTAB) 84 MG ( ) TBCR SR tablet Take 84 mg by mouth.   Omega-3 Fatty Acids (FISH OIL ) 500 MG CAPS Take by mouth 2 (two) times daily.   promethazine -dextromethorphan (PROMETHAZINE -DM) 6.25-15 MG/5ML syrup Take 5 mLs by mouth 4 (four) times daily as needed.   silver  sulfADIAZINE  (SILVADENE ) 1 % cream Apply 1 application topically daily.   tirzepatide  (MOUNJARO ) 2.5 MG/0.5ML Pen Inject 2.5 mg into the skin once a week.   vitamin k 100 MCG tablet Take 100 mcg by mouth daily.   doxycycline  (VIBRA -TABS) 100 MG tablet Take 1 tablet (100 mg total) by mouth 2 (two) times daily. (Patient not taking: Reported on 01/03/2025)   No facility-administered encounter medications on file as of 01/03/2025.    History: Past Medical History:  Diagnosis Date   Allergic rhinitis, cause unspecified 03/12/2012   Allergy    SEASONAL   Anxiety    Diabetes (HCC) 06/26/2008   Centricity Description: DM Qualifier: Diagnosis of  By: Norleen MD, Lynwood ORN  Centricity Description: DIABETES MELLITUS, TYPE II Qualifier: Diagnosis of  By: Norleen MD, Lynwood ORN    DIVERTICULOSIS, COLON 07/05/2008   DM w/o Complication Type II 06/26/2008   HYPERLIPIDEMIA 06/27/2008   HYPERTENSION 06/27/2008   Kidney stones    Mini stroke    OBSTRUCTIVE SLEEP APNEA 06/26/2008   Sleep apnea    CPAP   Stroke [facility])    MINI   Past Surgical History:  Procedure Laterality Date   COLONOSCOPY     COSMETIC SURGERY     NOSE SURGERY  1970   cosmetic   TONSILLECTOMY     Family History  Problem Relation Age of Onset   Heart disease Father        MI-smoker    Cancer Maternal Grandfather        type unknown   Cancer Maternal Grandmother        type unknown   Emphysema Paternal Uncle    Social History   Occupational History   Occupation: RETIRED/POSTAL Dentist: [employer]  Tobacco Use   Smoking status: Never   Smokeless tobacco: Never  Vaping Use   Vaping status: Never Used  Substance and Sexual Activity   Alcohol use: Not Currently    Alcohol/week: 1.0 standard drink of alcohol    Types: 1 Glasses of wine per week    Comment: occas   Drug use: No   Sexual activity: Yes   Tobacco Counseling Counseling given: No  SDOH Screenings   Food Insecurity: No Food Insecurity (01/02/2025)  Housing: Low Risk (01/03/2025)  Transportation Needs: No Transportation Needs (01/03/2025)  Utilities: Not At Risk (01/03/2025)  Alcohol Screen: Low Risk (01/02/2025)  Depression (PHQ2-9): Low Risk (01/03/2025)  Financial Resource Strain: Low Risk (01/02/2025)  Physical Activity: Sufficiently Active (01/03/2025)  Social Connections: Moderately Isolated (01/03/2025)  Stress: No Stress Concern Present (01/03/2025)  Tobacco Use: Low Risk (01/03/2025)  Health Literacy: Adequate Health Literacy (01/03/2025)   See flowsheets for full  screening details  Depression Screen PHQ 2 & 9 Depression Scale- Over the past 2 weeks, how often have you been bothered by any of the following problems? Little interest or pleasure in doing things: 0 Feeling down, depressed, or hopeless (PHQ Adolescent also includes...irritable): 0 PHQ-2 Total Score: 0 Trouble falling or staying asleep, or sleeping too much: 0 Feeling tired or having little energy: 0 Poor appetite or overeating (PHQ Adolescent also includes...weight loss): 0 Feeling bad about yourself - or that you are a failure or have let yourself or your family down: 0 Trouble concentrating on things, such as reading the newspaper or watching television (PHQ Adolescent also includes...like school work):  0 Moving or speaking so slowly that other people could have noticed. Or the opposite - being so fidgety or restless that you have been moving around a lot more than usual: 0 Thoughts that you would be better off dead, or of hurting yourself in some way: 0 PHQ-9 Total Score: 0 If you checked off any problems, how difficult have these problems made it for you to do your work, take care of things at home, or get along with other people?: Not difficult at all  Depression Treatment Depression Interventions/Treatment : EYV7-0 Score <4 Follow-up Not Indicated     Goals Addressed               This Visit's Progress     Patient Stated (pt-stated)        Patient stated he plans to continue exercising at [employer]             Objective:    Today's Vitals   01/03/25 1104  Weight: 276 lb (125.2 kg)  Height: 6' 2 (1.88 m)   Body mass index is 35.44 kg/m.  Hearing/Vision screen Hearing Screening - Comments:: Denies hearing difficulties   Vision Screening - Comments:: Wears rx glasses - up to date with routine eye exams with Danny [name] Immunizations and Health Maintenance Health Maintenance  Topic Date Due   OPHTHALMOLOGY EXAM  12/01/2024   HEMOGLOBIN A1C  06/05/2025   Diabetic kidney evaluation - eGFR measurement  12/05/2025   Diabetic kidney evaluation - Urine ACR  12/05/2025   FOOT EXAM  12/06/2025   Medicare Annual Wellness (AWV)  01/03/2026   Colonoscopy  05/16/2026   DTaP/Tdap/Td (4 - Td or Tdap) 12/04/2026   Pneumococcal Vaccine: 50+ Years  Completed   Influenza Vaccine  Completed   Hepatitis C Screening  Completed   Zoster Vaccines- Shingrix  Completed   Meningococcal B Vaccine  Aged Out   COVID-19 Vaccine  Discontinued        Assessment/Plan:  This is a routine wellness examination for Danny [name].  Ophthalmology status: appt w/Danny [name] in 02/2025 for a Diabetic eye exam  Patient Care Team: Norleen Lynwood ORN, MD as PCP - General [name] Danny, OD  (Optometry)  I have personally reviewed and noted the following in the patients chart:   Medical and social history Use of alcohol, tobacco or illicit drugs  Current medications and supplements including opioid prescriptions. Functional ability and status Nutritional status Physical activity Advanced directives List of other physicians Hospitalizations, surgeries, and ER visits in previous 12 months Vitals Screenings to include cognitive, depression, and falls Referrals and appointments  No orders of the defined types were placed in this encounter.  In addition, I have reviewed and discussed with patient certain preventive protocols, quality metrics, and best practice recommendations. A written personalized care plan for  preventive services as well as general preventive health recommendations were provided to patient.   Danny [name], CMA   01/03/2025   Return in 1 year (on 01/03/2026).  After Visit Summary: (MyChart) Due to this being a telephonic visit, the after visit summary with patients personalized plan was offered to patient via MyChart   Nurse Notes: scheduled 2027 AWV appt. (New pt appt w/Dr Jerrell to establish). "

## 2025-01-04 MED ORDER — TIRZEPATIDE 5 MG/0.5ML ~~LOC~~ SOAJ: 5.0000 mg | SUBCUTANEOUS | 3 refills | Status: DC

## 2025-01-04 NOTE — Addendum Note (Signed)
 Addended by: NORLEEN LYNWOOD ORN on: 01/04/2025 11:47 AM   Modules accepted: Orders

## 2025-01-04 NOTE — Telephone Encounter (Signed)
 Ok sure this is now done, thanks

## 2025-01-05 ENCOUNTER — Encounter: Payer: Self-pay | Admitting: Internal Medicine

## 2025-01-06 MED ORDER — TIRZEPATIDE 5 MG/0.5ML ~~LOC~~ SOAJ: 5.0000 mg | SUBCUTANEOUS | 11 refills | Status: DC

## 2025-01-11 MED ORDER — TIRZEPATIDE 5 MG/0.5ML ~~LOC~~ SOAJ: 5.0000 mg | SUBCUTANEOUS | 11 refills | Status: AC

## 2025-01-11 NOTE — Addendum Note (Signed)
 Addended by: NORLEEN LYNWOOD ORN on: 01/11/2025 11:57 AM   Modules accepted: Orders

## 2025-01-13 ENCOUNTER — Other Ambulatory Visit (HOSPITAL_COMMUNITY): Payer: Self-pay

## 2025-01-13 ENCOUNTER — Encounter: Payer: Self-pay | Admitting: "Endocrinology

## 2025-01-13 ENCOUNTER — Telehealth: Payer: Self-pay | Admitting: Pharmacy Technician

## 2025-01-13 NOTE — Telephone Encounter (Signed)
 Pharmacy Patient Advocate Encounter   Received notification from [employer] CMM KEY that prior authorization for Mounjaro  5MG /0.5ML auto-injectors is required/requested.   Insurance verification completed.   The patient is insured through [employer].   Per test claim: PA required; PA submitted to above mentioned insurance via Latent Key/confirmation #/EOC [id number] Status is pending

## 2025-01-16 ENCOUNTER — Other Ambulatory Visit (HOSPITAL_COMMUNITY): Payer: Self-pay

## 2025-01-16 NOTE — Telephone Encounter (Signed)
 Pharmacy Patient Advocate Encounter  Received notification from [facility] that Prior Authorization for Mounjaro  5MG /0.5ML auto-injectors has been APPROVED from 10/15/2024 to 01/13/2026. Unable to obtain price due to refill too soon rejection, last fill date 01/13/2025 next available fill date02/13/2026.   PA #/Case ID/Reference #: [id number]

## 2025-01-23 ENCOUNTER — Encounter: Payer: Self-pay | Admitting: Student in an Organized Health Care Education/Training Program

## 2025-01-23 NOTE — Telephone Encounter (Signed)
 This is not a Patient of [facility] but do not want their authorization request to be missed

## 2025-01-24 ENCOUNTER — Other Ambulatory Visit: Payer: Self-pay

## 2025-01-24 MED ORDER — ATORVASTATIN CALCIUM 10 MG PO TABS: 10.0000 mg | ORAL_TABLET | Freq: Every day | ORAL | 1 refills | Status: AC

## 2025-01-24 MED ORDER — AMLODIPINE BESYLATE 10 MG PO TABS: 10.0000 mg | ORAL_TABLET | Freq: Every day | ORAL | 3 refills | Status: AC

## 2025-02-08 ENCOUNTER — Other Ambulatory Visit

## 2025-04-20 ENCOUNTER — Other Ambulatory Visit

## 2025-04-24 ENCOUNTER — Ambulatory Visit: Admitting: "Endocrinology

## 2025-06-06 ENCOUNTER — Encounter: Admitting: Student in an Organized Health Care Education/Training Program

## 2026-01-10 ENCOUNTER — Ambulatory Visit
